# Patient Record
Sex: Male | Born: 1945 | Race: White | Hispanic: No | Marital: Married | Smoking: Former smoker
Health system: Southern US, Community
[De-identification: ages and names within clinical notes are randomized; demographics above are authoritative.]

## PROBLEM LIST (undated history)

## (undated) ENCOUNTER — Ambulatory Visit (HOSPITAL_COMMUNITY): Admission: EM | Source: Home / Self Care

## (undated) DIAGNOSIS — E7800 Pure hypercholesterolemia, unspecified: Secondary | ICD-10-CM

## (undated) DIAGNOSIS — H3530 Unspecified macular degeneration: Secondary | ICD-10-CM

## (undated) DIAGNOSIS — R202 Paresthesia of skin: Secondary | ICD-10-CM

## (undated) DIAGNOSIS — E119 Type 2 diabetes mellitus without complications: Secondary | ICD-10-CM

## (undated) DIAGNOSIS — Z860100 Personal history of colon polyps, unspecified: Secondary | ICD-10-CM

## (undated) DIAGNOSIS — G458 Other transient cerebral ischemic attacks and related syndromes: Secondary | ICD-10-CM

## (undated) DIAGNOSIS — L309 Dermatitis, unspecified: Secondary | ICD-10-CM

## (undated) DIAGNOSIS — M1990 Unspecified osteoarthritis, unspecified site: Secondary | ICD-10-CM

## (undated) DIAGNOSIS — M1120 Other chondrocalcinosis, unspecified site: Secondary | ICD-10-CM

## (undated) DIAGNOSIS — E785 Hyperlipidemia, unspecified: Secondary | ICD-10-CM

## (undated) DIAGNOSIS — R0989 Other specified symptoms and signs involving the circulatory and respiratory systems: Secondary | ICD-10-CM

## (undated) DIAGNOSIS — I509 Heart failure, unspecified: Secondary | ICD-10-CM

## (undated) HISTORY — DX: Paresthesia of skin: R20.2

## (undated) HISTORY — DX: Unspecified macular degeneration: H35.30

## (undated) HISTORY — PX: KNEE SURGERY: SHX244

## (undated) HISTORY — PX: REFRACTIVE SURGERY: SHX103

## (undated) HISTORY — PX: TONSILLECTOMY: SUR1361

## (undated) HISTORY — DX: Heart failure, unspecified: I50.9

## (undated) HISTORY — DX: Dermatitis, unspecified: L30.9

## (undated) HISTORY — PX: CATARACT EXTRACTION: SUR2

## (undated) HISTORY — DX: Other specified symptoms and signs involving the circulatory and respiratory systems: R09.89

## (undated) HISTORY — DX: Other transient cerebral ischemic attacks and related syndromes: G45.8

## (undated) HISTORY — DX: Pure hypercholesterolemia, unspecified: E78.00

## (undated) HISTORY — DX: Hyperlipidemia, unspecified: E78.5

## (undated) HISTORY — DX: Unspecified osteoarthritis, unspecified site: M19.90

## (undated) HISTORY — DX: Personal history of colon polyps, unspecified: Z86.0100

## (undated) HISTORY — DX: Other chondrocalcinosis, unspecified site: M11.20

## (undated) HISTORY — DX: Personal history of colonic polyps: Z86.010

## (undated) HISTORY — PX: HYDROCELE EXCISION / REPAIR: SUR1145

## (undated) HISTORY — PX: CARDIAC CATHETERIZATION: SHX172

---

## 2003-11-15 ENCOUNTER — Emergency Department (HOSPITAL_COMMUNITY): Admission: EM | Admit: 2003-11-15 | Discharge: 2003-11-16 | Payer: Self-pay | Admitting: Emergency Medicine

## 2003-12-03 ENCOUNTER — Encounter: Admission: RE | Admit: 2003-12-03 | Discharge: 2003-12-03 | Payer: Self-pay | Admitting: Internal Medicine

## 2005-10-11 ENCOUNTER — Encounter: Admission: RE | Admit: 2005-10-11 | Discharge: 2005-10-11 | Payer: Self-pay | Admitting: Internal Medicine

## 2005-11-18 ENCOUNTER — Ambulatory Visit (HOSPITAL_COMMUNITY): Admission: RE | Admit: 2005-11-18 | Discharge: 2005-11-18 | Payer: Self-pay | Admitting: Orthopedic Surgery

## 2012-06-28 DIAGNOSIS — M79609 Pain in unspecified limb: Secondary | ICD-10-CM | POA: Diagnosis not present

## 2012-07-12 DIAGNOSIS — B079 Viral wart, unspecified: Secondary | ICD-10-CM | POA: Diagnosis not present

## 2012-09-14 DIAGNOSIS — Z79899 Other long term (current) drug therapy: Secondary | ICD-10-CM | POA: Diagnosis not present

## 2012-09-14 DIAGNOSIS — F17200 Nicotine dependence, unspecified, uncomplicated: Secondary | ICD-10-CM | POA: Diagnosis not present

## 2012-09-14 DIAGNOSIS — E782 Mixed hyperlipidemia: Secondary | ICD-10-CM | POA: Diagnosis not present

## 2012-09-14 DIAGNOSIS — Z125 Encounter for screening for malignant neoplasm of prostate: Secondary | ICD-10-CM | POA: Diagnosis not present

## 2012-09-14 DIAGNOSIS — E1149 Type 2 diabetes mellitus with other diabetic neurological complication: Secondary | ICD-10-CM | POA: Diagnosis not present

## 2012-09-14 DIAGNOSIS — M1710 Unilateral primary osteoarthritis, unspecified knee: Secondary | ICD-10-CM | POA: Diagnosis not present

## 2012-09-14 DIAGNOSIS — IMO0002 Reserved for concepts with insufficient information to code with codable children: Secondary | ICD-10-CM | POA: Diagnosis not present

## 2012-09-14 DIAGNOSIS — IMO0001 Reserved for inherently not codable concepts without codable children: Secondary | ICD-10-CM | POA: Diagnosis not present

## 2012-12-05 DIAGNOSIS — E1149 Type 2 diabetes mellitus with other diabetic neurological complication: Secondary | ICD-10-CM | POA: Diagnosis not present

## 2012-12-05 DIAGNOSIS — Z0000 Encounter for general adult medical examination without abnormal findings: Secondary | ICD-10-CM | POA: Diagnosis not present

## 2012-12-05 DIAGNOSIS — IMO0001 Reserved for inherently not codable concepts without codable children: Secondary | ICD-10-CM | POA: Diagnosis not present

## 2012-12-05 DIAGNOSIS — M1710 Unilateral primary osteoarthritis, unspecified knee: Secondary | ICD-10-CM | POA: Diagnosis not present

## 2012-12-05 DIAGNOSIS — IMO0002 Reserved for concepts with insufficient information to code with codable children: Secondary | ICD-10-CM | POA: Diagnosis not present

## 2012-12-05 DIAGNOSIS — E782 Mixed hyperlipidemia: Secondary | ICD-10-CM | POA: Diagnosis not present

## 2012-12-05 DIAGNOSIS — F17200 Nicotine dependence, unspecified, uncomplicated: Secondary | ICD-10-CM | POA: Diagnosis not present

## 2012-12-05 DIAGNOSIS — N529 Male erectile dysfunction, unspecified: Secondary | ICD-10-CM | POA: Diagnosis not present

## 2013-01-11 DIAGNOSIS — Z79899 Other long term (current) drug therapy: Secondary | ICD-10-CM | POA: Diagnosis not present

## 2013-01-11 DIAGNOSIS — M1990 Unspecified osteoarthritis, unspecified site: Secondary | ICD-10-CM | POA: Diagnosis not present

## 2013-01-11 DIAGNOSIS — M2550 Pain in unspecified joint: Secondary | ICD-10-CM | POA: Diagnosis not present

## 2013-01-15 DIAGNOSIS — L821 Other seborrheic keratosis: Secondary | ICD-10-CM | POA: Diagnosis not present

## 2013-01-15 DIAGNOSIS — D239 Other benign neoplasm of skin, unspecified: Secondary | ICD-10-CM | POA: Diagnosis not present

## 2013-01-15 DIAGNOSIS — L819 Disorder of pigmentation, unspecified: Secondary | ICD-10-CM | POA: Diagnosis not present

## 2013-01-15 DIAGNOSIS — L570 Actinic keratosis: Secondary | ICD-10-CM | POA: Diagnosis not present

## 2013-03-25 DIAGNOSIS — E119 Type 2 diabetes mellitus without complications: Secondary | ICD-10-CM | POA: Diagnosis not present

## 2013-03-25 DIAGNOSIS — H2510 Age-related nuclear cataract, unspecified eye: Secondary | ICD-10-CM | POA: Diagnosis not present

## 2013-07-02 DIAGNOSIS — E782 Mixed hyperlipidemia: Secondary | ICD-10-CM | POA: Diagnosis not present

## 2013-07-02 DIAGNOSIS — F17200 Nicotine dependence, unspecified, uncomplicated: Secondary | ICD-10-CM | POA: Diagnosis not present

## 2013-07-02 DIAGNOSIS — M1990 Unspecified osteoarthritis, unspecified site: Secondary | ICD-10-CM | POA: Diagnosis not present

## 2013-07-02 DIAGNOSIS — N529 Male erectile dysfunction, unspecified: Secondary | ICD-10-CM | POA: Diagnosis not present

## 2013-07-02 DIAGNOSIS — Z23 Encounter for immunization: Secondary | ICD-10-CM | POA: Diagnosis not present

## 2013-07-02 DIAGNOSIS — IMO0001 Reserved for inherently not codable concepts without codable children: Secondary | ICD-10-CM | POA: Diagnosis not present

## 2013-07-02 DIAGNOSIS — L2089 Other atopic dermatitis: Secondary | ICD-10-CM | POA: Diagnosis not present

## 2013-07-08 DIAGNOSIS — M1990 Unspecified osteoarthritis, unspecified site: Secondary | ICD-10-CM | POA: Diagnosis not present

## 2013-07-08 DIAGNOSIS — M25519 Pain in unspecified shoulder: Secondary | ICD-10-CM | POA: Diagnosis not present

## 2013-07-08 DIAGNOSIS — M1120 Other chondrocalcinosis, unspecified site: Secondary | ICD-10-CM | POA: Diagnosis not present

## 2013-07-08 DIAGNOSIS — Z79899 Other long term (current) drug therapy: Secondary | ICD-10-CM | POA: Diagnosis not present

## 2013-07-19 DIAGNOSIS — IMO0001 Reserved for inherently not codable concepts without codable children: Secondary | ICD-10-CM | POA: Diagnosis not present

## 2013-07-19 DIAGNOSIS — N529 Male erectile dysfunction, unspecified: Secondary | ICD-10-CM | POA: Diagnosis not present

## 2013-09-30 DIAGNOSIS — M1120 Other chondrocalcinosis, unspecified site: Secondary | ICD-10-CM | POA: Diagnosis not present

## 2013-09-30 DIAGNOSIS — M25519 Pain in unspecified shoulder: Secondary | ICD-10-CM | POA: Diagnosis not present

## 2013-09-30 DIAGNOSIS — M1990 Unspecified osteoarthritis, unspecified site: Secondary | ICD-10-CM | POA: Diagnosis not present

## 2013-09-30 DIAGNOSIS — Z79899 Other long term (current) drug therapy: Secondary | ICD-10-CM | POA: Diagnosis not present

## 2013-10-07 DIAGNOSIS — R7989 Other specified abnormal findings of blood chemistry: Secondary | ICD-10-CM | POA: Diagnosis not present

## 2013-10-07 DIAGNOSIS — E119 Type 2 diabetes mellitus without complications: Secondary | ICD-10-CM | POA: Diagnosis not present

## 2013-10-22 DIAGNOSIS — E1149 Type 2 diabetes mellitus with other diabetic neurological complication: Secondary | ICD-10-CM | POA: Diagnosis not present

## 2013-10-22 DIAGNOSIS — F17200 Nicotine dependence, unspecified, uncomplicated: Secondary | ICD-10-CM | POA: Diagnosis not present

## 2013-10-22 DIAGNOSIS — L2089 Other atopic dermatitis: Secondary | ICD-10-CM | POA: Diagnosis not present

## 2013-10-22 DIAGNOSIS — IMO0001 Reserved for inherently not codable concepts without codable children: Secondary | ICD-10-CM | POA: Diagnosis not present

## 2013-11-22 DIAGNOSIS — IMO0001 Reserved for inherently not codable concepts without codable children: Secondary | ICD-10-CM | POA: Diagnosis not present

## 2013-11-26 DIAGNOSIS — IMO0001 Reserved for inherently not codable concepts without codable children: Secondary | ICD-10-CM | POA: Diagnosis not present

## 2013-11-26 DIAGNOSIS — L2089 Other atopic dermatitis: Secondary | ICD-10-CM | POA: Diagnosis not present

## 2013-11-26 DIAGNOSIS — F17200 Nicotine dependence, unspecified, uncomplicated: Secondary | ICD-10-CM | POA: Diagnosis not present

## 2013-12-04 DIAGNOSIS — J069 Acute upper respiratory infection, unspecified: Secondary | ICD-10-CM | POA: Diagnosis not present

## 2013-12-04 DIAGNOSIS — R5381 Other malaise: Secondary | ICD-10-CM | POA: Diagnosis not present

## 2013-12-04 DIAGNOSIS — H1030 Unspecified acute conjunctivitis, unspecified eye: Secondary | ICD-10-CM | POA: Diagnosis not present

## 2013-12-04 DIAGNOSIS — R5383 Other fatigue: Secondary | ICD-10-CM | POA: Diagnosis not present

## 2013-12-16 DIAGNOSIS — L259 Unspecified contact dermatitis, unspecified cause: Secondary | ICD-10-CM | POA: Diagnosis not present

## 2013-12-16 DIAGNOSIS — L570 Actinic keratosis: Secondary | ICD-10-CM | POA: Diagnosis not present

## 2013-12-16 DIAGNOSIS — L819 Disorder of pigmentation, unspecified: Secondary | ICD-10-CM | POA: Diagnosis not present

## 2013-12-16 DIAGNOSIS — L821 Other seborrheic keratosis: Secondary | ICD-10-CM | POA: Diagnosis not present

## 2013-12-23 DIAGNOSIS — L2089 Other atopic dermatitis: Secondary | ICD-10-CM | POA: Diagnosis not present

## 2013-12-23 DIAGNOSIS — E782 Mixed hyperlipidemia: Secondary | ICD-10-CM | POA: Diagnosis not present

## 2013-12-23 DIAGNOSIS — Z1331 Encounter for screening for depression: Secondary | ICD-10-CM | POA: Diagnosis not present

## 2013-12-23 DIAGNOSIS — H1030 Unspecified acute conjunctivitis, unspecified eye: Secondary | ICD-10-CM | POA: Diagnosis not present

## 2013-12-23 DIAGNOSIS — Z125 Encounter for screening for malignant neoplasm of prostate: Secondary | ICD-10-CM | POA: Diagnosis not present

## 2013-12-23 DIAGNOSIS — IMO0001 Reserved for inherently not codable concepts without codable children: Secondary | ICD-10-CM | POA: Diagnosis not present

## 2013-12-23 DIAGNOSIS — N529 Male erectile dysfunction, unspecified: Secondary | ICD-10-CM | POA: Diagnosis not present

## 2013-12-23 DIAGNOSIS — Z0000 Encounter for general adult medical examination without abnormal findings: Secondary | ICD-10-CM | POA: Diagnosis not present

## 2013-12-23 DIAGNOSIS — E1149 Type 2 diabetes mellitus with other diabetic neurological complication: Secondary | ICD-10-CM | POA: Diagnosis not present

## 2014-07-03 DIAGNOSIS — E1342 Other specified diabetes mellitus with diabetic polyneuropathy: Secondary | ICD-10-CM | POA: Diagnosis not present

## 2014-07-03 DIAGNOSIS — E782 Mixed hyperlipidemia: Secondary | ICD-10-CM | POA: Diagnosis not present

## 2014-07-03 DIAGNOSIS — E1165 Type 2 diabetes mellitus with hyperglycemia: Secondary | ICD-10-CM | POA: Diagnosis not present

## 2014-07-03 DIAGNOSIS — R209 Unspecified disturbances of skin sensation: Secondary | ICD-10-CM | POA: Diagnosis not present

## 2014-07-03 DIAGNOSIS — Z23 Encounter for immunization: Secondary | ICD-10-CM | POA: Diagnosis not present

## 2014-07-03 DIAGNOSIS — N5201 Erectile dysfunction due to arterial insufficiency: Secondary | ICD-10-CM | POA: Diagnosis not present

## 2014-07-03 DIAGNOSIS — Z79899 Other long term (current) drug therapy: Secondary | ICD-10-CM | POA: Diagnosis not present

## 2014-07-03 DIAGNOSIS — F17210 Nicotine dependence, cigarettes, uncomplicated: Secondary | ICD-10-CM | POA: Diagnosis not present

## 2014-07-03 DIAGNOSIS — R252 Cramp and spasm: Secondary | ICD-10-CM | POA: Diagnosis not present

## 2014-07-16 DIAGNOSIS — M1180 Other specified crystal arthropathies, unspecified site: Secondary | ICD-10-CM | POA: Diagnosis not present

## 2014-07-16 DIAGNOSIS — M150 Primary generalized (osteo)arthritis: Secondary | ICD-10-CM | POA: Diagnosis not present

## 2014-08-26 DIAGNOSIS — H2513 Age-related nuclear cataract, bilateral: Secondary | ICD-10-CM | POA: Diagnosis not present

## 2014-08-26 DIAGNOSIS — H3531 Nonexudative age-related macular degeneration: Secondary | ICD-10-CM | POA: Diagnosis not present

## 2014-08-26 DIAGNOSIS — H25013 Cortical age-related cataract, bilateral: Secondary | ICD-10-CM | POA: Diagnosis not present

## 2014-08-26 DIAGNOSIS — E119 Type 2 diabetes mellitus without complications: Secondary | ICD-10-CM | POA: Diagnosis not present

## 2014-12-03 DIAGNOSIS — H40013 Open angle with borderline findings, low risk, bilateral: Secondary | ICD-10-CM | POA: Diagnosis not present

## 2014-12-18 DIAGNOSIS — L812 Freckles: Secondary | ICD-10-CM | POA: Diagnosis not present

## 2014-12-18 DIAGNOSIS — L570 Actinic keratosis: Secondary | ICD-10-CM | POA: Diagnosis not present

## 2014-12-18 DIAGNOSIS — D225 Melanocytic nevi of trunk: Secondary | ICD-10-CM | POA: Diagnosis not present

## 2014-12-18 DIAGNOSIS — L853 Xerosis cutis: Secondary | ICD-10-CM | POA: Diagnosis not present

## 2014-12-18 DIAGNOSIS — L821 Other seborrheic keratosis: Secondary | ICD-10-CM | POA: Diagnosis not present

## 2014-12-18 DIAGNOSIS — D1801 Hemangioma of skin and subcutaneous tissue: Secondary | ICD-10-CM | POA: Diagnosis not present

## 2014-12-26 DIAGNOSIS — F17210 Nicotine dependence, cigarettes, uncomplicated: Secondary | ICD-10-CM | POA: Diagnosis not present

## 2014-12-26 DIAGNOSIS — Z1389 Encounter for screening for other disorder: Secondary | ICD-10-CM | POA: Diagnosis not present

## 2014-12-26 DIAGNOSIS — E1140 Type 2 diabetes mellitus with diabetic neuropathy, unspecified: Secondary | ICD-10-CM | POA: Diagnosis not present

## 2014-12-26 DIAGNOSIS — E1165 Type 2 diabetes mellitus with hyperglycemia: Secondary | ICD-10-CM | POA: Diagnosis not present

## 2014-12-26 DIAGNOSIS — Z79899 Other long term (current) drug therapy: Secondary | ICD-10-CM | POA: Diagnosis not present

## 2014-12-26 DIAGNOSIS — R252 Cramp and spasm: Secondary | ICD-10-CM | POA: Diagnosis not present

## 2014-12-26 DIAGNOSIS — R209 Unspecified disturbances of skin sensation: Secondary | ICD-10-CM | POA: Diagnosis not present

## 2014-12-26 DIAGNOSIS — Z125 Encounter for screening for malignant neoplasm of prostate: Secondary | ICD-10-CM | POA: Diagnosis not present

## 2014-12-26 DIAGNOSIS — Z0001 Encounter for general adult medical examination with abnormal findings: Secondary | ICD-10-CM | POA: Diagnosis not present

## 2015-01-13 ENCOUNTER — Other Ambulatory Visit: Payer: Self-pay | Admitting: Acute Care

## 2015-01-13 DIAGNOSIS — Z87891 Personal history of nicotine dependence: Secondary | ICD-10-CM

## 2015-01-14 ENCOUNTER — Ambulatory Visit (INDEPENDENT_AMBULATORY_CARE_PROVIDER_SITE_OTHER): Payer: Medicare Other | Admitting: Acute Care

## 2015-01-14 ENCOUNTER — Encounter (INDEPENDENT_AMBULATORY_CARE_PROVIDER_SITE_OTHER): Payer: Self-pay

## 2015-01-14 ENCOUNTER — Encounter: Payer: Self-pay | Admitting: Acute Care

## 2015-01-14 DIAGNOSIS — Z87891 Personal history of nicotine dependence: Secondary | ICD-10-CM

## 2015-01-14 NOTE — Progress Notes (Signed)
Shared Decision Making Visit Lung Cancer Screening Program [phone])   Eligibility:  Age : 70 years old  Pack Years Smoking History Calculation 57 pack years (# packs/per year x # years smoked)  Recent History of coughing up blood  no  Unexplained weight loss? no ( >Than 15 pounds within the last 6 months )  Prior History Lung / other cancer no (Diagnosis within the last 5 years already requiring surveillance chest CT Scans).  Smoking Status: Current Smoker  Former Smokers: Years since quit: Current smoker  Quit Date: N/A  Visit Components:  Discussion included one or more decision making aids. yes  Discussion included risk/benefits of screening. yes  Discussion included potential follow up diagnostic testing for abnormal scans. yes  Discussion included meaning and risk of over diagnosis. yes  Discussion included meaning and risk of False Positives. yes  Discussion included meaning of total radiation exposure. yes  Counseling Included:  Importance of adherence to annual lung cancer LDCT screening. yes  Impact of comorbidities on ability to participate in the program. yes  Ability and willingness to under diagnostic treatment. yes  Smoking Cessation Counseling:  Current Smokers:   Discussed importance of smoking cessation. yes  Information about tobacco cessation classes and interventions provided to patient. yes  Patient provided with "ticket" for LDCT Scan. yes  Symptomatic Patient. no  Counseling :Not symptomatic  Diagnosis Code: Tobacco Use Z72.0  Asymptomatic Patient yes  Counseling: Yes, >3 minutes  Former Smokers:   Discussed the importance of maintaining cigarette abstinence. yes  Diagnosis Code: Personal History of Nicotine Dependence. S34.196  Information about tobacco cessation classes and interventions provided to patient. Yes  Patient provided with "ticket" for LDCT Scan. yes  Written Order for Lung Cancer Screening with LDCT placed  in Epic. Yes (CT Chest Lung Cancer Screening Low Dose W/O CM) [id number] Z12.2-Screening of respiratory organs Z87.891-Personal history of nicotine dependence  I spent 15 minutes of face to face time meeting with Mr. Lapoint and his wife discussing the risks and benefits of the lung cancer screening program to him as an individual.He verbalized understanding of the program, and understood the risks and benefits, and how to determine if this program is a good fit for him.We discussed that the single most powerful thing he can do to decrease his risk of lung cancer is to quit smoking. He stated that he has diabetes and the weight gain is what causes him the most concern when considering quitting smoking. I gave him a " Be stronger than your excuses" card, with information within the community to assist him in his quit journey. I also told him that we are here to help in any way when he decides he is ready to quit with non-nicotine medications, nicotine replacement therapy, and support groups and resources.He verbalized understanding.We viewed the power point. He and his wife both asked very appropriate questions.He is scheduled for his CT scan 01/15/15 at 0815. He knows where to go for the scan. I have told him I will call him with the scan results.He has my contact information should he need to get in touch with me.He is a very nice gentleman who verbalized understanding of the risks and benefits of the screening program.   Magdalen Spatz, AGACNP-BC

## 2015-01-15 ENCOUNTER — Telehealth: Payer: Self-pay | Admitting: Acute Care

## 2015-01-15 ENCOUNTER — Ambulatory Visit (INDEPENDENT_AMBULATORY_CARE_PROVIDER_SITE_OTHER)
Admission: RE | Admit: 2015-01-15 | Discharge: 2015-01-15 | Disposition: A | Discharge status: Home / Self Care | Payer: Medicare Other | Source: Ambulatory Visit | Attending: Acute Care | Admitting: Acute Care

## 2015-01-15 DIAGNOSIS — Z87891 Personal history of nicotine dependence: Secondary | ICD-10-CM

## 2015-01-15 NOTE — Telephone Encounter (Signed)
Casey Lynch called for the results of his low dose CT scan.I told him he has a Lung Rads 2 scan, which is a nodule that is benign( not cancer ) and that it is not suspicious or concerning. He will not need another scan for 12 months. He verbalized understanding and has my contact information if he has any further questions.

## 2015-01-21 ENCOUNTER — Encounter: Payer: Self-pay | Admitting: Acute Care

## 2015-03-02 ENCOUNTER — Other Ambulatory Visit: Payer: Self-pay

## 2015-03-20 ENCOUNTER — Telehealth: Payer: Self-pay | Admitting: *Deleted

## 2015-03-20 NOTE — Telephone Encounter (Signed)
Oncology Nurse Navigator Documentation  Oncology Nurse Navigator Flowsheets 03/20/2015  Navigator Encounter Type Other/Telephone call.  I called to follow up with patient regarding his smoking cessation.   I left a vm message with my name and phone number to call if needed.   Barriers/Navigation Needs Education

## 2015-07-03 DIAGNOSIS — E1140 Type 2 diabetes mellitus with diabetic neuropathy, unspecified: Secondary | ICD-10-CM | POA: Diagnosis not present

## 2015-07-03 DIAGNOSIS — F17210 Nicotine dependence, cigarettes, uncomplicated: Secondary | ICD-10-CM | POA: Diagnosis not present

## 2015-07-03 DIAGNOSIS — R209 Unspecified disturbances of skin sensation: Secondary | ICD-10-CM | POA: Diagnosis not present

## 2015-07-03 DIAGNOSIS — R252 Cramp and spasm: Secondary | ICD-10-CM | POA: Diagnosis not present

## 2015-07-03 DIAGNOSIS — Z79899 Other long term (current) drug therapy: Secondary | ICD-10-CM | POA: Diagnosis not present

## 2015-07-03 DIAGNOSIS — E1165 Type 2 diabetes mellitus with hyperglycemia: Secondary | ICD-10-CM | POA: Diagnosis not present

## 2015-07-03 DIAGNOSIS — Z23 Encounter for immunization: Secondary | ICD-10-CM | POA: Diagnosis not present

## 2015-07-17 DIAGNOSIS — M25519 Pain in unspecified shoulder: Secondary | ICD-10-CM | POA: Diagnosis not present

## 2015-07-17 DIAGNOSIS — M1180 Other specified crystal arthropathies, unspecified site: Secondary | ICD-10-CM | POA: Diagnosis not present

## 2015-07-17 DIAGNOSIS — M150 Primary generalized (osteo)arthritis: Secondary | ICD-10-CM | POA: Diagnosis not present

## 2015-08-18 DIAGNOSIS — H25013 Cortical age-related cataract, bilateral: Secondary | ICD-10-CM | POA: Diagnosis not present

## 2015-08-18 DIAGNOSIS — H353122 Nonexudative age-related macular degeneration, left eye, intermediate dry stage: Secondary | ICD-10-CM | POA: Diagnosis not present

## 2015-08-18 DIAGNOSIS — H11153 Pinguecula, bilateral: Secondary | ICD-10-CM | POA: Diagnosis not present

## 2015-08-18 DIAGNOSIS — H353111 Nonexudative age-related macular degeneration, right eye, early dry stage: Secondary | ICD-10-CM | POA: Diagnosis not present

## 2015-08-18 DIAGNOSIS — H2513 Age-related nuclear cataract, bilateral: Secondary | ICD-10-CM | POA: Diagnosis not present

## 2015-08-18 DIAGNOSIS — E119 Type 2 diabetes mellitus without complications: Secondary | ICD-10-CM | POA: Diagnosis not present

## 2015-08-18 DIAGNOSIS — H40013 Open angle with borderline findings, low risk, bilateral: Secondary | ICD-10-CM | POA: Diagnosis not present

## 2015-11-24 ENCOUNTER — Other Ambulatory Visit: Payer: Self-pay | Admitting: Acute Care

## 2015-11-24 DIAGNOSIS — F17210 Nicotine dependence, cigarettes, uncomplicated: Secondary | ICD-10-CM

## 2015-12-07 DIAGNOSIS — H40013 Open angle with borderline findings, low risk, bilateral: Secondary | ICD-10-CM | POA: Diagnosis not present

## 2015-12-25 DIAGNOSIS — L281 Prurigo nodularis: Secondary | ICD-10-CM | POA: Diagnosis not present

## 2015-12-25 DIAGNOSIS — L853 Xerosis cutis: Secondary | ICD-10-CM | POA: Diagnosis not present

## 2015-12-25 DIAGNOSIS — L570 Actinic keratosis: Secondary | ICD-10-CM | POA: Diagnosis not present

## 2015-12-25 DIAGNOSIS — L821 Other seborrheic keratosis: Secondary | ICD-10-CM | POA: Diagnosis not present

## 2016-01-04 DIAGNOSIS — M25511 Pain in right shoulder: Secondary | ICD-10-CM | POA: Diagnosis not present

## 2016-01-04 DIAGNOSIS — M2550 Pain in unspecified joint: Secondary | ICD-10-CM | POA: Diagnosis not present

## 2016-01-04 DIAGNOSIS — M7062 Trochanteric bursitis, left hip: Secondary | ICD-10-CM | POA: Diagnosis not present

## 2016-01-04 DIAGNOSIS — M1120 Other chondrocalcinosis, unspecified site: Secondary | ICD-10-CM | POA: Diagnosis not present

## 2016-01-04 DIAGNOSIS — M7061 Trochanteric bursitis, right hip: Secondary | ICD-10-CM | POA: Diagnosis not present

## 2016-01-04 DIAGNOSIS — M25512 Pain in left shoulder: Secondary | ICD-10-CM | POA: Diagnosis not present

## 2016-01-04 DIAGNOSIS — M150 Primary generalized (osteo)arthritis: Secondary | ICD-10-CM | POA: Diagnosis not present

## 2016-06-22 ENCOUNTER — Ambulatory Visit (INDEPENDENT_AMBULATORY_CARE_PROVIDER_SITE_OTHER)
Admission: RE | Admit: 2016-06-22 | Discharge: 2016-06-22 | Disposition: A | Discharge status: Home / Self Care | Payer: Medicare Other | Source: Ambulatory Visit | Attending: Acute Care | Admitting: Acute Care

## 2016-06-22 DIAGNOSIS — Z87891 Personal history of nicotine dependence: Secondary | ICD-10-CM

## 2016-06-22 DIAGNOSIS — F17210 Nicotine dependence, cigarettes, uncomplicated: Secondary | ICD-10-CM | POA: Diagnosis not present

## 2016-06-23 DIAGNOSIS — L308 Other specified dermatitis: Secondary | ICD-10-CM | POA: Diagnosis not present

## 2016-06-24 ENCOUNTER — Telehealth: Payer: Self-pay | Admitting: Acute Care

## 2016-06-24 DIAGNOSIS — F17210 Nicotine dependence, cigarettes, uncomplicated: Secondary | ICD-10-CM

## 2016-06-24 NOTE — Telephone Encounter (Signed)
I have called [name] with the results of his low-dose screening CT. I explained that his scan was read as a Lung RADS 2: nodules that are benign in appearance and behavior with a very low likelihood of becoming a clinically active cancer due to size or lack of growth. Recommendation per radiology is for a repeat LDCT in 12 months. I told him we will order and schedule his annual scan for October 2018. Additionally we discussed the fact that the scan, like last year scan, indicated aortic atherosclerosis in addition to two-vessel coronary artery disease. I spoke with the patient about this who states that he cannot tolerate statin medications. He does have his cholesterol and triglycerides checked annually by his primary care provider. I told him I would send a copy of the scan to Dr. Inda Lynch his primary care physician. We also discussed the fact that the scan indicated mild emphysema and imaging suggesting underlying COPD. He verbalized understanding of the above and had no further questions at completion of the call.

## 2016-06-29 DIAGNOSIS — H40013 Open angle with borderline findings, low risk, bilateral: Secondary | ICD-10-CM | POA: Diagnosis not present

## 2016-07-01 DIAGNOSIS — R209 Unspecified disturbances of skin sensation: Secondary | ICD-10-CM | POA: Diagnosis not present

## 2016-07-01 DIAGNOSIS — Z86010 Personal history of colonic polyps: Secondary | ICD-10-CM | POA: Diagnosis not present

## 2016-07-01 DIAGNOSIS — M1120 Other chondrocalcinosis, unspecified site: Secondary | ICD-10-CM | POA: Diagnosis not present

## 2016-07-01 DIAGNOSIS — E1165 Type 2 diabetes mellitus with hyperglycemia: Secondary | ICD-10-CM | POA: Diagnosis not present

## 2016-07-01 DIAGNOSIS — F17210 Nicotine dependence, cigarettes, uncomplicated: Secondary | ICD-10-CM | POA: Diagnosis not present

## 2016-07-01 DIAGNOSIS — Z0000 Encounter for general adult medical examination without abnormal findings: Secondary | ICD-10-CM | POA: Diagnosis not present

## 2016-07-01 DIAGNOSIS — E782 Mixed hyperlipidemia: Secondary | ICD-10-CM | POA: Diagnosis not present

## 2016-07-01 DIAGNOSIS — Z1211 Encounter for screening for malignant neoplasm of colon: Secondary | ICD-10-CM | POA: Diagnosis not present

## 2016-07-01 DIAGNOSIS — E1140 Type 2 diabetes mellitus with diabetic neuropathy, unspecified: Secondary | ICD-10-CM | POA: Diagnosis not present

## 2016-07-01 DIAGNOSIS — Z7984 Long term (current) use of oral hypoglycemic drugs: Secondary | ICD-10-CM | POA: Diagnosis not present

## 2016-07-01 DIAGNOSIS — Z136 Encounter for screening for cardiovascular disorders: Secondary | ICD-10-CM | POA: Diagnosis not present

## 2016-07-01 DIAGNOSIS — Z79899 Other long term (current) drug therapy: Secondary | ICD-10-CM | POA: Diagnosis not present

## 2016-07-01 DIAGNOSIS — R252 Cramp and spasm: Secondary | ICD-10-CM | POA: Diagnosis not present

## 2016-07-01 DIAGNOSIS — Z1389 Encounter for screening for other disorder: Secondary | ICD-10-CM | POA: Diagnosis not present

## 2016-07-04 DIAGNOSIS — M150 Primary generalized (osteo)arthritis: Secondary | ICD-10-CM | POA: Diagnosis not present

## 2016-07-04 DIAGNOSIS — M1120 Other chondrocalcinosis, unspecified site: Secondary | ICD-10-CM | POA: Diagnosis not present

## 2016-07-04 DIAGNOSIS — M2550 Pain in unspecified joint: Secondary | ICD-10-CM | POA: Diagnosis not present

## 2016-07-04 DIAGNOSIS — Z79899 Other long term (current) drug therapy: Secondary | ICD-10-CM | POA: Diagnosis not present

## 2016-07-05 ENCOUNTER — Other Ambulatory Visit: Payer: Self-pay | Admitting: Internal Medicine

## 2016-07-05 DIAGNOSIS — Z136 Encounter for screening for cardiovascular disorders: Secondary | ICD-10-CM

## 2016-07-05 DIAGNOSIS — F17210 Nicotine dependence, cigarettes, uncomplicated: Secondary | ICD-10-CM

## 2016-07-07 ENCOUNTER — Ambulatory Visit
Admission: RE | Admit: 2016-07-07 | Discharge: 2016-07-07 | Disposition: A | Discharge status: Home / Self Care | Payer: Medicare Other | Source: Ambulatory Visit | Attending: Internal Medicine | Admitting: Internal Medicine

## 2016-07-07 DIAGNOSIS — Z87891 Personal history of nicotine dependence: Secondary | ICD-10-CM | POA: Diagnosis not present

## 2016-07-07 DIAGNOSIS — Z136 Encounter for screening for cardiovascular disorders: Secondary | ICD-10-CM | POA: Diagnosis not present

## 2016-07-07 DIAGNOSIS — F17210 Nicotine dependence, cigarettes, uncomplicated: Secondary | ICD-10-CM

## 2016-08-11 ENCOUNTER — Other Ambulatory Visit: Payer: Self-pay | Admitting: Gastroenterology

## 2016-10-24 ENCOUNTER — Ambulatory Visit (HOSPITAL_COMMUNITY): Payer: Medicare Other | Admitting: Anesthesiology

## 2016-10-24 ENCOUNTER — Encounter (HOSPITAL_COMMUNITY)
Admission: RE | Disposition: A | Discharge status: Home / Self Care | Payer: Self-pay | Source: Ambulatory Visit | Attending: Gastroenterology

## 2016-10-24 ENCOUNTER — Encounter (HOSPITAL_COMMUNITY): Payer: Self-pay

## 2016-10-24 ENCOUNTER — Ambulatory Visit (HOSPITAL_COMMUNITY)
Admission: RE | Admit: 2016-10-24 | Discharge: 2016-10-24 | Disposition: A | Discharge status: Home / Self Care | Payer: Medicare Other | Source: Ambulatory Visit | Attending: Gastroenterology | Admitting: Gastroenterology

## 2016-10-24 DIAGNOSIS — D123 Benign neoplasm of transverse colon: Secondary | ICD-10-CM | POA: Diagnosis not present

## 2016-10-24 DIAGNOSIS — M1990 Unspecified osteoarthritis, unspecified site: Secondary | ICD-10-CM | POA: Insufficient documentation

## 2016-10-24 DIAGNOSIS — K5790 Diverticulosis of intestine, part unspecified, without perforation or abscess without bleeding: Secondary | ICD-10-CM | POA: Diagnosis not present

## 2016-10-24 DIAGNOSIS — D122 Benign neoplasm of ascending colon: Secondary | ICD-10-CM | POA: Insufficient documentation

## 2016-10-24 DIAGNOSIS — D124 Benign neoplasm of descending colon: Secondary | ICD-10-CM | POA: Diagnosis not present

## 2016-10-24 DIAGNOSIS — Z87891 Personal history of nicotine dependence: Secondary | ICD-10-CM | POA: Diagnosis not present

## 2016-10-24 DIAGNOSIS — Z7984 Long term (current) use of oral hypoglycemic drugs: Secondary | ICD-10-CM | POA: Insufficient documentation

## 2016-10-24 DIAGNOSIS — Z1211 Encounter for screening for malignant neoplasm of colon: Secondary | ICD-10-CM | POA: Insufficient documentation

## 2016-10-24 DIAGNOSIS — Z86010 Personal history of colonic polyps: Secondary | ICD-10-CM | POA: Insufficient documentation

## 2016-10-24 DIAGNOSIS — K635 Polyp of colon: Secondary | ICD-10-CM | POA: Diagnosis not present

## 2016-10-24 DIAGNOSIS — E119 Type 2 diabetes mellitus without complications: Secondary | ICD-10-CM | POA: Diagnosis not present

## 2016-10-24 DIAGNOSIS — D120 Benign neoplasm of cecum: Secondary | ICD-10-CM | POA: Insufficient documentation

## 2016-10-24 HISTORY — DX: Type 2 diabetes mellitus without complications: E11.9

## 2016-10-24 HISTORY — PX: COLONOSCOPY WITH PROPOFOL: SHX5780

## 2016-10-24 LAB — GLUCOSE, CAPILLARY: Glucose-Capillary: 158 mg/dL — ABNORMAL HIGH (ref 65–99)

## 2016-10-24 SURGERY — COLONOSCOPY WITH PROPOFOL
Anesthesia: Monitor Anesthesia Care

## 2016-10-24 MED ORDER — PROPOFOL 500 MG/50ML IV EMUL
INTRAVENOUS | Status: DC | PRN
  Administered 2016-10-24: 125 ug/kg/min via INTRAVENOUS

## 2016-10-24 MED ORDER — SODIUM CHLORIDE 0.9 % IV SOLN
INTRAVENOUS | Status: DC
Start: 2016-10-24 — End: 2016-10-24

## 2016-10-24 MED ORDER — LIDOCAINE 2% (20 MG/ML) 5 ML SYRINGE
INTRAMUSCULAR | Status: AC
  Filled 2016-10-24: qty 5

## 2016-10-24 MED ORDER — PROPOFOL 10 MG/ML IV BOLUS
INTRAVENOUS | Status: AC
  Filled 2016-10-24: qty 60

## 2016-10-24 MED ORDER — PROPOFOL 10 MG/ML IV BOLUS
INTRAVENOUS | Status: DC | PRN
  Administered 2016-10-24: 30 mg via INTRAVENOUS
  Administered 2016-10-24 (×2): 20 mg via INTRAVENOUS

## 2016-10-24 MED ORDER — LIDOCAINE 2% (20 MG/ML) 5 ML SYRINGE
INTRAMUSCULAR | Status: DC | PRN
  Administered 2016-10-24: 60 mg via INTRAVENOUS

## 2016-10-24 MED ORDER — LACTATED RINGERS IV SOLN
INTRAVENOUS | Status: DC
  Administered 2016-10-24: 1000 mL via INTRAVENOUS

## 2016-10-24 NOTE — Op Note (Signed)
[facility] Patient Name: Casey Lynch Procedure Date: 10/24/2016 MRN: [id number] Attending MD: Garlan Fair , MD Date of Birth: 08/09/1946 CSN: [id number] Age: 71 Admit Type: Outpatient Procedure:                Colonoscopy Indications:              High risk colon cancer surveillance: Personal                            history of non-advanced adenoma Providers:                Garlan Fair, MD, Jobe Igo, RN, Cherylynn Ridges, Technician, Dione Booze, CRNA Referring MD:              Medicines:                Propofol per Anesthesia Complications:            No immediate complications. Estimated Blood Loss:     Estimated blood loss was minimal. Procedure:                Pre-Anesthesia Assessment:                           - Prior to the procedure, a History and Physical                            was performed, and patient medications and                            allergies were reviewed. The patient's tolerance of                            previous anesthesia was also reviewed. The risks                            and benefits of the procedure and the sedation                            options and risks were discussed with the patient.                            All questions were answered, and informed consent                            was obtained. Prior Anticoagulants: The patient has                            taken aspirin, last dose was day of procedure. ASA                            Grade Assessment: II - A patient with mild systemic  disease. After reviewing the risks and benefits,                            the patient was deemed in satisfactory condition to                            undergo the procedure.                           After obtaining informed consent, the colonoscope                            was passed under direct vision. Throughout the   procedure, the patient's blood pressure, pulse, and                            oxygen saturations were monitored continuously. The                            [id number]) scope was introduced through                            the anus and advanced to the the cecum, identified                            by appendiceal orifice and ileocecal valve. The                            colonoscopy was performed without difficulty. The                            patient tolerated the procedure well. The quality                            of the bowel preparation was adequate. The terminal                            ileum, the ileocecal valve, the appendiceal orifice                            and the rectum were photographed. Scope In: 10:30:46 AM Scope Out: 11:09:34 AM Scope Withdrawal Time: 0 hours 30 minutes 47 seconds  Total Procedure Duration: 0 hours 38 minutes 48 seconds  Findings:      The perianal and digital rectal examinations were normal.      A 3 mm polyp was found in the cecum. The polyp was sessile. The polyp       was removed with a cold biopsy forceps. Resection and retrieval were       complete.      Two sessile polyps were found in the ascending colon. The polyps were 5       to 7 mm in size. These polyps were removed with a cold snare. Resection       and retrieval were complete.      Two sessile polyps were found in the descending colon. The  polyps were 7       mm in size. These polyps were removed with a hot snare. Resection and       retrieval were complete.      Two sessile polyps were found in the descending colon. The polyps were 5       mm in size. These polyps were removed with a cold snare. Resection and       retrieval were complete.      The exam was otherwise without abnormality. Impression:               - One 3 mm polyp in the cecum, removed with a cold                            biopsy forceps. Resected and retrieved.                           - Two 5 to  7 mm polyps in the ascending colon,                            removed with a cold snare. Resected and retrieved.                           - Two 7 mm polyps in the descending colon, removed                            with a hot snare. Resected and retrieved.                           - Two 5 mm polyps in the descending colon, removed                            with a cold snare. Resected and retrieved.                           - The examination was otherwise normal. Moderate Sedation:      N/A- Per Anesthesia Care Recommendation:           - Patient has a contact number available for                            emergencies. The signs and symptoms of potential                            delayed complications were discussed with the                            patient. Return to normal activities tomorrow.                            Written discharge instructions were provided to the                            patient.                           -  Repeat colonoscopy date to be determined after                            pending pathology results are reviewed for                            surveillance.                           - Resume previous diet.                           - Continue present medications. Procedure Code(s):        --- Professional ---                           [phone], Colonoscopy, flexible; with removal of                            tumor(s), polyp(s), or other lesion(s) by snare                            technique                           [id number], 68, Colonoscopy, flexible; with biopsy,                            single or multiple Diagnosis Code(s):        --- Professional ---                           Z86.010, Personal history of colonic polyps                           D12.0, Benign neoplasm of cecum                           D12.2, Benign neoplasm of ascending colon                           D12.4, Benign neoplasm of descending colon CPT copyright 2016 American  Medical Association. All rights reserved. The codes documented in this report are preliminary and upon coder review may  be revised to meet current compliance requirements. Earle Gell, MD Garlan Fair, MD 10/24/2016 11:12:49 AM This report has been signed electronically. Number of Addenda: 0

## 2016-10-24 NOTE — Anesthesia Preprocedure Evaluation (Addendum)
Anesthesia Evaluation  Patient identified by MRN, date of birth, ID band Patient awake    Reviewed: Allergy & Precautions, NPO status , Patient's Chart, lab work & pertinent test results  Airway Mallampati: I  TM Distance: >3 FB Neck ROM: Full    Dental  (+) Teeth Intact, Dental Advisory Given   Pulmonary former smoker,    breath sounds clear to auscultation       Cardiovascular negative cardio ROS   Rhythm:Regular Rate:Normal     Neuro/Psych negative neurological ROS  negative psych ROS   GI/Hepatic negative GI ROS, Neg liver ROS,   Endo/Other  diabetes, Type 2, Oral Hypoglycemic Agents  Renal/GU negative Renal ROS  negative genitourinary   Musculoskeletal negative musculoskeletal ROS (+)   Abdominal   Peds negative pediatric ROS (+)  Hematology negative hematology ROS (+)   Anesthesia Other Findings   Reproductive/Obstetrics negative OB ROS                            Anesthesia Physical Anesthesia Plan  ASA: II  Anesthesia Plan: MAC   Post-op Pain Management:    Induction: Intravenous  Airway Management Planned: Natural Airway  Additional Equipment:   Intra-op Plan:   Post-operative Plan:   Informed Consent: I have reviewed the patients History and Physical, chart, labs and discussed the procedure including the risks, benefits and alternatives for the proposed anesthesia with the patient or authorized representative who has indicated his/her understanding and acceptance.     Plan Discussed with: CRNA  Anesthesia Plan Comments:         Anesthesia Quick Evaluation

## 2016-10-24 NOTE — Transfer of Care (Signed)
Immediate Anesthesia Transfer of Care Note  Patient: Casey Lynch  Procedure(s) Performed: Procedure(s): COLONOSCOPY WITH PROPOFOL (N/A)  Patient Location: PACU and Endoscopy Unit  Anesthesia Type:MAC  Level of Consciousness: sedated and patient cooperative  Airway & Oxygen Therapy: Patient Spontanous Breathing and Patient connected to face mask oxygen  Post-op Assessment: Report given to RN and Post -op Vital signs reviewed and stable  Post vital signs: Reviewed and stable  Last Vitals:  Vitals:   10/24/16 0841  BP: 137/85  Pulse: 85  Resp: 20  Temp: 36.7 C    Last Pain:  Vitals:   10/24/16 0841  TempSrc: Oral         Complications: No apparent anesthesia complications

## 2016-10-24 NOTE — Anesthesia Postprocedure Evaluation (Addendum)
Anesthesia Post Note  Patient: Casey Lynch  Procedure(s) Performed: Procedure(s) (LRB): COLONOSCOPY WITH PROPOFOL (N/A)  Patient location during evaluation: PACU Anesthesia Type: MAC Level of consciousness: awake and alert Pain management: pain level controlled Vital Signs Assessment: post-procedure vital signs reviewed and stable Respiratory status: spontaneous breathing, nonlabored ventilation, respiratory function stable and patient connected to nasal cannula oxygen Cardiovascular status: stable and blood pressure returned to baseline Anesthetic complications: no       Last Vitals:  Vitals:   10/24/16 0841 10/24/16 1108  BP: 137/85 102/79  Pulse: 85 88  Resp: 20 14  Temp: 36.7 C 37 C    Last Pain:  Vitals:   10/24/16 1108  TempSrc: Oral                 Effie Berkshire

## 2016-10-24 NOTE — Discharge Instructions (Signed)
Colonoscopy, Adult, Care After  This sheet gives you information about how to care for yourself after your procedure. Your health care provider may also give you more specific instructions. If you have problems or questions, contact your health care provider.  What can I expect after the procedure?  After the procedure, it is common to have:  · A small amount of blood in your stool for 24 hours after the procedure.  · Some gas.  · Mild abdominal cramping or bloating.    Follow these instructions at home:  General instructions     · For the first 24 hours after the procedure:  ? Do not drive or use machinery.  ? Do not sign important documents.  ? Do not drink alcohol.  ? Do your regular daily activities at a slower pace than normal.  ? Eat soft, easy-to-digest foods.  ? Rest often.  · Take over-the-counter or prescription medicines only as told by your health care provider.  · It is up to you to get the results of your procedure. Ask your health care provider, or the department performing the procedure, when your results will be ready.  Relieving cramping and bloating   · Try walking around when you have cramps or feel bloated.  · Apply heat to your abdomen as told by your health care provider. Use a heat source that your health care provider recommends, such as a moist heat pack or a heating pad.  ? Place a towel between your skin and the heat source.  ? Leave the heat on for 20-30 minutes.  ? Remove the heat if your skin turns bright red. This is especially important if you are unable to feel pain, heat, or cold. You may have a greater risk of getting burned.  Eating and drinking   · Drink enough fluid to keep your urine clear or pale yellow.  · Resume your normal diet as instructed by your health care provider. Avoid heavy or fried foods that are hard to digest.  · Avoid drinking alcohol for as long as instructed by your health care provider.  Contact a health care provider if:  · You have blood in your stool 2-3  days after the procedure.  Get help right away if:  · You have more than a small spotting of blood in your stool.  · You pass large blood clots in your stool.  · Your abdomen is swollen.  · You have nausea or vomiting.  · You have a fever.  · You have increasing abdominal pain that is not relieved with medicine.  This information is not intended to replace advice given to you by your health care provider. Make sure you discuss any questions you have with your health care provider.  Document Released: 04/05/2004 Document Revised: 05/16/2016 Document Reviewed: 11/03/2015  Elsevier Interactive Patient Education © 2017 [employer]

## 2016-10-24 NOTE — Anesthesia Procedure Notes (Signed)
Procedure Name: MAC Date/Time: 10/24/2016 10:24 AM Performed by: Dione Booze Pre-anesthesia Checklist: Patient identified, Patient being monitored, Suction available and Emergency Drugs available Patient Re-evaluated:Patient Re-evaluated prior to inductionOxygen Delivery Method: Simple face mask Placement Confirmation: positive ETCO2

## 2016-10-24 NOTE — H&P (Signed)
Procedure: Surveillance colonoscopy. 04/17/2012 colonoscopy was performed with removal of two small adenomatous descending colon polyps.  History: The patient is a 71 year old male born Dec 18, 1945. He is scheduled to undergo a surveillance colonoscopy today. He recently stopped smoking and reports normal bowel function. I will not screen for microscopic colitis.  Past medical history: Pseudogout. Osteoarthritis. Type 2 diabetes mellitus. Bilateral arthroscopic knee surgeries. Hydrocele repair. Tonsillectomy. Eyelid surgery.  Medication allergies: Tetanus toxoid vaccine.  Exam: The patient is alert and lying comfortably on the endoscopy stretcher. Abdomen is soft and nontender to palpation. Lungs are clear to auscultation. Cardiac exam reveals a regular rhythm.  Plan: Proceed with surveillance colonoscopy

## 2016-10-25 ENCOUNTER — Encounter (HOSPITAL_COMMUNITY): Payer: Self-pay | Admitting: Gastroenterology

## 2016-12-12 DIAGNOSIS — H40013 Open angle with borderline findings, low risk, bilateral: Secondary | ICD-10-CM | POA: Diagnosis not present

## 2016-12-12 DIAGNOSIS — L570 Actinic keratosis: Secondary | ICD-10-CM | POA: Diagnosis not present

## 2016-12-12 DIAGNOSIS — H2513 Age-related nuclear cataract, bilateral: Secondary | ICD-10-CM | POA: Diagnosis not present

## 2016-12-12 DIAGNOSIS — D1801 Hemangioma of skin and subcutaneous tissue: Secondary | ICD-10-CM | POA: Diagnosis not present

## 2016-12-12 DIAGNOSIS — H35033 Hypertensive retinopathy, bilateral: Secondary | ICD-10-CM | POA: Diagnosis not present

## 2016-12-12 DIAGNOSIS — L821 Other seborrheic keratosis: Secondary | ICD-10-CM | POA: Diagnosis not present

## 2016-12-12 DIAGNOSIS — E119 Type 2 diabetes mellitus without complications: Secondary | ICD-10-CM | POA: Diagnosis not present

## 2016-12-20 DIAGNOSIS — E559 Vitamin D deficiency, unspecified: Secondary | ICD-10-CM | POA: Diagnosis not present

## 2016-12-20 DIAGNOSIS — R208 Other disturbances of skin sensation: Secondary | ICD-10-CM | POA: Diagnosis not present

## 2016-12-20 DIAGNOSIS — N5201 Erectile dysfunction due to arterial insufficiency: Secondary | ICD-10-CM | POA: Diagnosis not present

## 2016-12-20 DIAGNOSIS — Z79899 Other long term (current) drug therapy: Secondary | ICD-10-CM | POA: Diagnosis not present

## 2016-12-20 DIAGNOSIS — Z7984 Long term (current) use of oral hypoglycemic drugs: Secondary | ICD-10-CM | POA: Diagnosis not present

## 2016-12-20 DIAGNOSIS — E1165 Type 2 diabetes mellitus with hyperglycemia: Secondary | ICD-10-CM | POA: Diagnosis not present

## 2016-12-20 DIAGNOSIS — E1140 Type 2 diabetes mellitus with diabetic neuropathy, unspecified: Secondary | ICD-10-CM | POA: Diagnosis not present

## 2016-12-20 DIAGNOSIS — E782 Mixed hyperlipidemia: Secondary | ICD-10-CM | POA: Diagnosis not present

## 2016-12-20 DIAGNOSIS — Z125 Encounter for screening for malignant neoplasm of prostate: Secondary | ICD-10-CM | POA: Diagnosis not present

## 2016-12-22 DIAGNOSIS — E782 Mixed hyperlipidemia: Secondary | ICD-10-CM | POA: Diagnosis not present

## 2016-12-22 DIAGNOSIS — Z79899 Other long term (current) drug therapy: Secondary | ICD-10-CM | POA: Diagnosis not present

## 2016-12-22 DIAGNOSIS — Z658 Other specified problems related to psychosocial circumstances: Secondary | ICD-10-CM | POA: Diagnosis not present

## 2016-12-22 DIAGNOSIS — F17210 Nicotine dependence, cigarettes, uncomplicated: Secondary | ICD-10-CM | POA: Diagnosis not present

## 2016-12-22 DIAGNOSIS — Z7984 Long term (current) use of oral hypoglycemic drugs: Secondary | ICD-10-CM | POA: Diagnosis not present

## 2016-12-22 DIAGNOSIS — E1140 Type 2 diabetes mellitus with diabetic neuropathy, unspecified: Secondary | ICD-10-CM | POA: Diagnosis not present

## 2016-12-22 DIAGNOSIS — E559 Vitamin D deficiency, unspecified: Secondary | ICD-10-CM | POA: Diagnosis not present

## 2016-12-27 DIAGNOSIS — M150 Primary generalized (osteo)arthritis: Secondary | ICD-10-CM | POA: Diagnosis not present

## 2016-12-27 DIAGNOSIS — M2550 Pain in unspecified joint: Secondary | ICD-10-CM | POA: Diagnosis not present

## 2016-12-27 DIAGNOSIS — Z79899 Other long term (current) drug therapy: Secondary | ICD-10-CM | POA: Diagnosis not present

## 2016-12-27 DIAGNOSIS — M1120 Other chondrocalcinosis, unspecified site: Secondary | ICD-10-CM | POA: Diagnosis not present

## 2016-12-27 DIAGNOSIS — E663 Overweight: Secondary | ICD-10-CM | POA: Diagnosis not present

## 2016-12-27 DIAGNOSIS — Z6827 Body mass index (BMI) 27.0-27.9, adult: Secondary | ICD-10-CM | POA: Diagnosis not present

## 2017-02-03 NOTE — Addendum Note (Signed)
Addendum  created 02/03/17 1024 by Effie Berkshire, MD   Sign clinical note

## 2017-06-28 ENCOUNTER — Ambulatory Visit (INDEPENDENT_AMBULATORY_CARE_PROVIDER_SITE_OTHER)
Admission: RE | Admit: 2017-06-28 | Discharge: 2017-06-28 | Disposition: A | Discharge status: Home / Self Care | Payer: Medicare Other | Source: Ambulatory Visit | Attending: Acute Care | Admitting: Acute Care

## 2017-06-28 DIAGNOSIS — Z87891 Personal history of nicotine dependence: Secondary | ICD-10-CM | POA: Diagnosis not present

## 2017-06-28 DIAGNOSIS — F17210 Nicotine dependence, cigarettes, uncomplicated: Secondary | ICD-10-CM

## 2017-06-29 ENCOUNTER — Other Ambulatory Visit: Payer: Self-pay | Admitting: Acute Care

## 2017-06-29 DIAGNOSIS — Z87891 Personal history of nicotine dependence: Secondary | ICD-10-CM

## 2017-06-29 DIAGNOSIS — Z122 Encounter for screening for malignant neoplasm of respiratory organs: Secondary | ICD-10-CM

## 2017-07-05 ENCOUNTER — Other Ambulatory Visit: Payer: Self-pay | Admitting: Internal Medicine

## 2017-07-05 DIAGNOSIS — Z125 Encounter for screening for malignant neoplasm of prostate: Secondary | ICD-10-CM | POA: Diagnosis not present

## 2017-07-05 DIAGNOSIS — M1120 Other chondrocalcinosis, unspecified site: Secondary | ICD-10-CM | POA: Diagnosis not present

## 2017-07-05 DIAGNOSIS — Z658 Other specified problems related to psychosocial circumstances: Secondary | ICD-10-CM | POA: Diagnosis not present

## 2017-07-05 DIAGNOSIS — M179 Osteoarthritis of knee, unspecified: Secondary | ICD-10-CM | POA: Diagnosis not present

## 2017-07-05 DIAGNOSIS — E559 Vitamin D deficiency, unspecified: Secondary | ICD-10-CM | POA: Diagnosis not present

## 2017-07-05 DIAGNOSIS — Z23 Encounter for immunization: Secondary | ICD-10-CM | POA: Diagnosis not present

## 2017-07-05 DIAGNOSIS — E782 Mixed hyperlipidemia: Secondary | ICD-10-CM | POA: Diagnosis not present

## 2017-07-05 DIAGNOSIS — Z0000 Encounter for general adult medical examination without abnormal findings: Secondary | ICD-10-CM | POA: Diagnosis not present

## 2017-07-05 DIAGNOSIS — Z86010 Personal history of colonic polyps: Secondary | ICD-10-CM | POA: Diagnosis not present

## 2017-07-05 DIAGNOSIS — E1140 Type 2 diabetes mellitus with diabetic neuropathy, unspecified: Secondary | ICD-10-CM | POA: Diagnosis not present

## 2017-07-05 DIAGNOSIS — Z79899 Other long term (current) drug therapy: Secondary | ICD-10-CM | POA: Diagnosis not present

## 2017-07-05 DIAGNOSIS — R0989 Other specified symptoms and signs involving the circulatory and respiratory systems: Secondary | ICD-10-CM | POA: Diagnosis not present

## 2017-07-05 DIAGNOSIS — R209 Unspecified disturbances of skin sensation: Secondary | ICD-10-CM | POA: Diagnosis not present

## 2017-07-05 DIAGNOSIS — N5201 Erectile dysfunction due to arterial insufficiency: Secondary | ICD-10-CM | POA: Diagnosis not present

## 2017-07-05 DIAGNOSIS — Z1389 Encounter for screening for other disorder: Secondary | ICD-10-CM | POA: Diagnosis not present

## 2017-07-06 DIAGNOSIS — M2550 Pain in unspecified joint: Secondary | ICD-10-CM | POA: Diagnosis not present

## 2017-07-06 DIAGNOSIS — Z6828 Body mass index (BMI) 28.0-28.9, adult: Secondary | ICD-10-CM | POA: Diagnosis not present

## 2017-07-06 DIAGNOSIS — E663 Overweight: Secondary | ICD-10-CM | POA: Diagnosis not present

## 2017-07-06 DIAGNOSIS — Z79899 Other long term (current) drug therapy: Secondary | ICD-10-CM | POA: Diagnosis not present

## 2017-07-06 DIAGNOSIS — M1120 Other chondrocalcinosis, unspecified site: Secondary | ICD-10-CM | POA: Diagnosis not present

## 2017-07-06 DIAGNOSIS — M150 Primary generalized (osteo)arthritis: Secondary | ICD-10-CM | POA: Diagnosis not present

## 2017-07-12 ENCOUNTER — Ambulatory Visit
Admission: RE | Admit: 2017-07-12 | Discharge: 2017-07-12 | Disposition: A | Discharge status: Home / Self Care | Payer: Medicare Other | Source: Ambulatory Visit | Attending: Internal Medicine | Admitting: Internal Medicine

## 2017-07-12 DIAGNOSIS — I6523 Occlusion and stenosis of bilateral carotid arteries: Secondary | ICD-10-CM | POA: Diagnosis not present

## 2017-07-12 DIAGNOSIS — R0989 Other specified symptoms and signs involving the circulatory and respiratory systems: Secondary | ICD-10-CM

## 2017-07-12 DIAGNOSIS — M170 Bilateral primary osteoarthritis of knee: Secondary | ICD-10-CM | POA: Diagnosis not present

## 2017-07-12 DIAGNOSIS — E782 Mixed hyperlipidemia: Secondary | ICD-10-CM | POA: Diagnosis not present

## 2017-07-12 DIAGNOSIS — Z7984 Long term (current) use of oral hypoglycemic drugs: Secondary | ICD-10-CM | POA: Diagnosis not present

## 2017-07-12 DIAGNOSIS — Z79899 Other long term (current) drug therapy: Secondary | ICD-10-CM | POA: Diagnosis not present

## 2017-07-12 DIAGNOSIS — M150 Primary generalized (osteo)arthritis: Secondary | ICD-10-CM | POA: Diagnosis not present

## 2017-07-12 DIAGNOSIS — E1342 Other specified diabetes mellitus with diabetic polyneuropathy: Secondary | ICD-10-CM | POA: Diagnosis not present

## 2017-07-12 DIAGNOSIS — E1165 Type 2 diabetes mellitus with hyperglycemia: Secondary | ICD-10-CM | POA: Diagnosis not present

## 2017-07-12 DIAGNOSIS — G458 Other transient cerebral ischemic attacks and related syndromes: Secondary | ICD-10-CM | POA: Diagnosis not present

## 2017-08-23 ENCOUNTER — Encounter: Payer: Self-pay | Admitting: Vascular Surgery

## 2017-08-23 ENCOUNTER — Ambulatory Visit (INDEPENDENT_AMBULATORY_CARE_PROVIDER_SITE_OTHER): Payer: Medicare Other | Admitting: Vascular Surgery

## 2017-08-23 VITALS — BP 153/73 | HR 80 | Temp 97.0°F | Resp 16 | Ht 67.0 in | Wt 182.0 lb

## 2017-08-23 DIAGNOSIS — G458 Other transient cerebral ischemic attacks and related syndromes: Secondary | ICD-10-CM

## 2017-08-23 NOTE — Progress Notes (Signed)
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  The consult is requested by Dr. Josetta Huddle.  HPI:   Casey Lynch is a pleasant 71 y.o. male, who apparently had a left carotid bruit.  This prompted a carotid duplex scan that was done on 07/12/2017.  This showed evidence of possible subclavian steal and the patient was sent for vascular consultation.  I have reviewed the records that were sent from the referring office.  The patient has a history of mixed hyperlipidemia which is under good control.  Patient also has diabetes.  The patient is right-handed.  He denies any history of stroke, TIAs, expressive or receptive aphasia, or amaurosis fugax.  He denies any problems with dizziness.  He denies any symptoms in either upper extremity.  Specifically, he denies weakness or paresthesias.  It was noted at a previous office visit and is referring office that the blood pressure in the left arm was lower than the right.  His risk factors for peripheral vascular disease include diabetes, hypercholesterolemia, and history of tobacco use.  Past Medical History:  Diagnosis Date  . Carotid bruit   . Diabetes mellitus without complication ([place])   . Eczema   . History of colonic polyps   . Hypercholesterolemia   . Hyperlipidemia   . Macular degeneration   . Osteoarthritis   . Paresthesias   . Pseudogout     No family history on file.  There is no family history of premature cardiovascular disease.  SOCIAL HISTORY: The patient quit smoking on 09/05/2016. Social History   Socioeconomic History  . Marital status: Married    Spouse name: Not on file  . Number of children: Not on file  . Years of education: Not on file  . Highest education level: Not on file  Social Needs  . Financial resource strain: Not on file  . Food insecurity - worry: Not on file  . Food insecurity - inability: Not on file  .  Transportation needs - medical: Not on file  . Transportation needs - non-medical: Not on file  Occupational History  . Not on file  Tobacco Use  . Smoking status: Former Smoker    Packs/day: 1.00    Years: 57.00    Pack years: 57.00    Types: Cigarettes    Last attempt to quit: 09/05/2016    Years since quitting: 0.9  . Smokeless tobacco: Never Used  Substance and Sexual Activity  . Alcohol use: Yes    Alcohol/week: 0.0 oz    Comment: Rare  . Drug use: No  . Sexual activity: Yes  Other Topics Concern  . Not on file  Social History Narrative  . Not on file    Allergies  Allergen Reactions  . Statins Other (See Comments)    Terrible joint pain  . Tetanus Toxoids Other (See Comments)    Fever 104/105 & arm stiffiness    Current Outpatient Medications  Medication Sig Dispense Refill  . aspirin 81 MG tablet Take 81 mg by mouth daily.    Marland Kitchen b complex vitamins capsule Take 1 capsule by mouth daily.    Marland Kitchen CINNAMON PO Take 1,000 mg by mouth 2 (two) times daily.    . colchicine (COLCRYS) 0.6 MG tablet Take 0.6 mg by mouth daily.    Marland Kitchen glimepiride (AMARYL) 4 MG tablet Take 4 mg by mouth daily with breakfast.    .  metFORMIN (GLUCOPHAGE) 1000 MG tablet Take 1,000 mg by mouth 2 (two) times daily.    . naproxen sodium (ANAPROX) 220 MG tablet Take 220 mg by mouth 2 (two) times daily as needed (for pain.).    Marland Kitchen triamcinolone cream (KENALOG) 0.1 % Apply 1 application topically 2 (two) times daily as needed (for dry skin.).    . Turmeric 1053 MG TABS Take 1,053 mg by mouth.    . Vitamin D, Ergocalciferol, 2000 units CAPS Take by mouth daily.     No current facility-administered medications for this visit.     REVIEW OF SYSTEMS:  [X]  denotes positive finding, [ ]  denotes negative finding Cardiac  Comments:  Chest pain or chest pressure:    Shortness of breath upon exertion: X   Short of breath when lying flat:    Irregular heart rhythm:        Vascular    Pain in calf, thigh, or hip  brought on by ambulation: X   Pain in feet at night that wakes you up from your sleep:     Blood clot in your veins:    Leg swelling:         Pulmonary    Oxygen at home:    Productive cough:     Wheezing:         Neurologic    Sudden weakness in arms or legs:     Sudden numbness in arms or legs:     Sudden onset of difficulty speaking or slurred speech:    Temporary loss of vision in one eye:     Problems with dizziness:         Gastrointestinal    Blood in stool:     Vomited blood:         Genitourinary    Burning when urinating:     Blood in urine:        Psychiatric    Major depression:         Hematologic    Bleeding problems:    Problems with blood clotting too easily:        Skin    Rashes or ulcers:        Constitutional    Fever or chills:     PHYSICAL EXAM:   Vitals:   08/23/17 0856 08/23/17 0900  BP: 129/85 (!) 153/73  Pulse: 80   Resp: 16   Temp: (!) 97 F (36.1 C)   TempSrc: Oral   SpO2: 98%   Weight: 182 lb (82.6 kg)   Height: 5\' 7"  (1.702 m)     GENERAL: The patient is a well-nourished male, in no acute distress. The vital signs are documented above. CARDIAC: There is a regular rate and rhythm.  VASCULAR: He has a left carotid bruit. He has palpable femoral, popliteal, and pedal pulses bilaterally. He has no significant lower extremity swelling. PULMONARY: There is good air exchange bilaterally without wheezing or rales. ABDOMEN: Soft and non-tender with normal pitched bowel sounds.  MUSCULOSKELETAL: There are no major deformities or cyanosis. NEUROLOGIC: No focal weakness or paresthesias are detected. SKIN: There are no ulcers or rashes noted. PSYCHIATRIC: The patient has a normal affect.  DATA:    LABS: GFR on 07/12/2017 was greater than 60.  Hemoglobin was 12.3.  Platelet count 352,000.  CAROTID DUPLEX: I have reviewed the carotid duplex scan that was done on 07/12/2017.  The patient had less than 50% right carotid stenosis and less  than 50% left  carotid stenosis.  However, an incidental finding was retrograde flow in the left vertebral artery possibly consistent with subclavian steal syndrome.  MEDICAL ISSUES:   LEFT SUBCLAVIAN STENOSIS: Based on his exam and duplex study, he has evidence of a left subclavian stenosis.  However, this is asymptomatic.  I explained that we would only consider arteriography and possible angioplasty and stenting if he developed persistent problems with dizziness or other vertebrobasilar symptoms.  Fortunately he quit smoking about a year ago.  I have encouraged him to stay as active as possible.  We have discussed the importance of nutrition.  I be happy to see him back at any time if he develops any new symptoms.  Deitra Mayo [facility] [phone]

## 2017-08-25 DIAGNOSIS — E1342 Other specified diabetes mellitus with diabetic polyneuropathy: Secondary | ICD-10-CM | POA: Diagnosis not present

## 2017-08-25 DIAGNOSIS — E782 Mixed hyperlipidemia: Secondary | ICD-10-CM | POA: Diagnosis not present

## 2017-08-25 DIAGNOSIS — E1165 Type 2 diabetes mellitus with hyperglycemia: Secondary | ICD-10-CM | POA: Diagnosis not present

## 2017-08-25 DIAGNOSIS — Z7984 Long term (current) use of oral hypoglycemic drugs: Secondary | ICD-10-CM | POA: Diagnosis not present

## 2017-08-25 DIAGNOSIS — G458 Other transient cerebral ischemic attacks and related syndromes: Secondary | ICD-10-CM | POA: Diagnosis not present

## 2017-08-25 DIAGNOSIS — M179 Osteoarthritis of knee, unspecified: Secondary | ICD-10-CM | POA: Diagnosis not present

## 2017-08-25 DIAGNOSIS — E1140 Type 2 diabetes mellitus with diabetic neuropathy, unspecified: Secondary | ICD-10-CM | POA: Diagnosis not present

## 2017-10-04 DIAGNOSIS — G458 Other transient cerebral ischemic attacks and related syndromes: Secondary | ICD-10-CM | POA: Diagnosis not present

## 2017-10-04 DIAGNOSIS — M179 Osteoarthritis of knee, unspecified: Secondary | ICD-10-CM | POA: Diagnosis not present

## 2017-10-04 DIAGNOSIS — E1165 Type 2 diabetes mellitus with hyperglycemia: Secondary | ICD-10-CM | POA: Diagnosis not present

## 2017-10-04 DIAGNOSIS — E782 Mixed hyperlipidemia: Secondary | ICD-10-CM | POA: Diagnosis not present

## 2017-10-04 DIAGNOSIS — Z7984 Long term (current) use of oral hypoglycemic drugs: Secondary | ICD-10-CM | POA: Diagnosis not present

## 2017-10-04 DIAGNOSIS — E1342 Other specified diabetes mellitus with diabetic polyneuropathy: Secondary | ICD-10-CM | POA: Diagnosis not present

## 2017-11-13 DIAGNOSIS — E119 Type 2 diabetes mellitus without complications: Secondary | ICD-10-CM | POA: Diagnosis not present

## 2017-11-13 DIAGNOSIS — H401111 Primary open-angle glaucoma, right eye, mild stage: Secondary | ICD-10-CM | POA: Diagnosis not present

## 2017-11-13 DIAGNOSIS — H40053 Ocular hypertension, bilateral: Secondary | ICD-10-CM | POA: Diagnosis not present

## 2017-11-13 DIAGNOSIS — H40022 Open angle with borderline findings, high risk, left eye: Secondary | ICD-10-CM | POA: Diagnosis not present

## 2017-12-05 DIAGNOSIS — H40051 Ocular hypertension, right eye: Secondary | ICD-10-CM | POA: Diagnosis not present

## 2017-12-05 DIAGNOSIS — H401111 Primary open-angle glaucoma, right eye, mild stage: Secondary | ICD-10-CM | POA: Diagnosis not present

## 2017-12-07 DIAGNOSIS — E1342 Other specified diabetes mellitus with diabetic polyneuropathy: Secondary | ICD-10-CM | POA: Diagnosis not present

## 2017-12-07 DIAGNOSIS — G458 Other transient cerebral ischemic attacks and related syndromes: Secondary | ICD-10-CM | POA: Diagnosis not present

## 2017-12-07 DIAGNOSIS — E1165 Type 2 diabetes mellitus with hyperglycemia: Secondary | ICD-10-CM | POA: Diagnosis not present

## 2017-12-07 DIAGNOSIS — H6981 Other specified disorders of Eustachian tube, right ear: Secondary | ICD-10-CM | POA: Diagnosis not present

## 2017-12-07 DIAGNOSIS — J309 Allergic rhinitis, unspecified: Secondary | ICD-10-CM | POA: Diagnosis not present

## 2017-12-07 DIAGNOSIS — Z7984 Long term (current) use of oral hypoglycemic drugs: Secondary | ICD-10-CM | POA: Diagnosis not present

## 2017-12-07 DIAGNOSIS — R030 Elevated blood-pressure reading, without diagnosis of hypertension: Secondary | ICD-10-CM | POA: Diagnosis not present

## 2017-12-14 DIAGNOSIS — D1801 Hemangioma of skin and subcutaneous tissue: Secondary | ICD-10-CM | POA: Diagnosis not present

## 2017-12-14 DIAGNOSIS — L821 Other seborrheic keratosis: Secondary | ICD-10-CM | POA: Diagnosis not present

## 2017-12-14 DIAGNOSIS — L812 Freckles: Secondary | ICD-10-CM | POA: Diagnosis not present

## 2017-12-14 DIAGNOSIS — L308 Other specified dermatitis: Secondary | ICD-10-CM | POA: Diagnosis not present

## 2017-12-14 DIAGNOSIS — D225 Melanocytic nevi of trunk: Secondary | ICD-10-CM | POA: Diagnosis not present

## 2017-12-14 DIAGNOSIS — L570 Actinic keratosis: Secondary | ICD-10-CM | POA: Diagnosis not present

## 2017-12-19 DIAGNOSIS — H40052 Ocular hypertension, left eye: Secondary | ICD-10-CM | POA: Diagnosis not present

## 2017-12-19 DIAGNOSIS — H40022 Open angle with borderline findings, high risk, left eye: Secondary | ICD-10-CM | POA: Diagnosis not present

## 2018-01-03 DIAGNOSIS — J449 Chronic obstructive pulmonary disease, unspecified: Secondary | ICD-10-CM | POA: Diagnosis not present

## 2018-01-03 DIAGNOSIS — R0602 Shortness of breath: Secondary | ICD-10-CM | POA: Diagnosis not present

## 2018-01-03 DIAGNOSIS — M1990 Unspecified osteoarthritis, unspecified site: Secondary | ICD-10-CM | POA: Diagnosis not present

## 2018-01-03 DIAGNOSIS — K219 Gastro-esophageal reflux disease without esophagitis: Secondary | ICD-10-CM | POA: Diagnosis not present

## 2018-01-04 DIAGNOSIS — M1120 Other chondrocalcinosis, unspecified site: Secondary | ICD-10-CM | POA: Diagnosis not present

## 2018-01-04 DIAGNOSIS — M2550 Pain in unspecified joint: Secondary | ICD-10-CM | POA: Diagnosis not present

## 2018-01-04 DIAGNOSIS — Z6829 Body mass index (BMI) 29.0-29.9, adult: Secondary | ICD-10-CM | POA: Diagnosis not present

## 2018-01-04 DIAGNOSIS — Z79899 Other long term (current) drug therapy: Secondary | ICD-10-CM | POA: Diagnosis not present

## 2018-01-04 DIAGNOSIS — E663 Overweight: Secondary | ICD-10-CM | POA: Diagnosis not present

## 2018-01-04 DIAGNOSIS — M150 Primary generalized (osteo)arthritis: Secondary | ICD-10-CM | POA: Diagnosis not present

## 2018-01-09 DIAGNOSIS — H401111 Primary open-angle glaucoma, right eye, mild stage: Secondary | ICD-10-CM | POA: Diagnosis not present

## 2018-01-09 DIAGNOSIS — H40053 Ocular hypertension, bilateral: Secondary | ICD-10-CM | POA: Diagnosis not present

## 2018-01-19 DIAGNOSIS — G4733 Obstructive sleep apnea (adult) (pediatric): Secondary | ICD-10-CM | POA: Diagnosis not present

## 2018-01-19 DIAGNOSIS — R9431 Abnormal electrocardiogram [ECG] [EKG]: Secondary | ICD-10-CM | POA: Diagnosis not present

## 2018-01-19 DIAGNOSIS — I509 Heart failure, unspecified: Secondary | ICD-10-CM | POA: Diagnosis not present

## 2018-01-19 DIAGNOSIS — R0602 Shortness of breath: Secondary | ICD-10-CM | POA: Diagnosis not present

## 2018-01-19 DIAGNOSIS — J0100 Acute maxillary sinusitis, unspecified: Secondary | ICD-10-CM | POA: Diagnosis not present

## 2018-01-19 DIAGNOSIS — R079 Chest pain, unspecified: Secondary | ICD-10-CM | POA: Diagnosis not present

## 2018-01-19 DIAGNOSIS — R0600 Dyspnea, unspecified: Secondary | ICD-10-CM | POA: Diagnosis not present

## 2018-01-19 DIAGNOSIS — Z7984 Long term (current) use of oral hypoglycemic drugs: Secondary | ICD-10-CM | POA: Diagnosis not present

## 2018-01-19 DIAGNOSIS — R7989 Other specified abnormal findings of blood chemistry: Secondary | ICD-10-CM | POA: Diagnosis not present

## 2018-01-19 DIAGNOSIS — Z7982 Long term (current) use of aspirin: Secondary | ICD-10-CM | POA: Diagnosis not present

## 2018-01-19 DIAGNOSIS — E119 Type 2 diabetes mellitus without complications: Secondary | ICD-10-CM | POA: Diagnosis not present

## 2018-01-19 DIAGNOSIS — R000 Tachycardia, unspecified: Secondary | ICD-10-CM | POA: Diagnosis not present

## 2018-01-19 DIAGNOSIS — I5021 Acute systolic (congestive) heart failure: Secondary | ICD-10-CM | POA: Diagnosis not present

## 2018-01-19 DIAGNOSIS — R062 Wheezing: Secondary | ICD-10-CM | POA: Diagnosis not present

## 2018-01-19 DIAGNOSIS — R05 Cough: Secondary | ICD-10-CM | POA: Diagnosis not present

## 2018-01-19 DIAGNOSIS — Z86010 Personal history of colonic polyps: Secondary | ICD-10-CM | POA: Diagnosis not present

## 2018-01-19 DIAGNOSIS — I340 Nonrheumatic mitral (valve) insufficiency: Secondary | ICD-10-CM | POA: Diagnosis not present

## 2018-01-19 DIAGNOSIS — I4510 Unspecified right bundle-branch block: Secondary | ICD-10-CM | POA: Diagnosis not present

## 2018-01-19 DIAGNOSIS — I110 Hypertensive heart disease with heart failure: Secondary | ICD-10-CM | POA: Diagnosis not present

## 2018-01-19 DIAGNOSIS — E874 Mixed disorder of acid-base balance: Secondary | ICD-10-CM | POA: Diagnosis not present

## 2018-01-19 DIAGNOSIS — J449 Chronic obstructive pulmonary disease, unspecified: Secondary | ICD-10-CM | POA: Diagnosis not present

## 2018-01-19 DIAGNOSIS — Z87891 Personal history of nicotine dependence: Secondary | ICD-10-CM | POA: Diagnosis not present

## 2018-01-19 DIAGNOSIS — I272 Pulmonary hypertension, unspecified: Secondary | ICD-10-CM | POA: Diagnosis not present

## 2018-01-19 DIAGNOSIS — E785 Hyperlipidemia, unspecified: Secondary | ICD-10-CM | POA: Diagnosis not present

## 2018-01-19 DIAGNOSIS — I208 Other forms of angina pectoris: Secondary | ICD-10-CM | POA: Diagnosis not present

## 2018-01-19 DIAGNOSIS — B9789 Other viral agents as the cause of diseases classified elsewhere: Secondary | ICD-10-CM | POA: Diagnosis not present

## 2018-01-21 DIAGNOSIS — I5021 Acute systolic (congestive) heart failure: Secondary | ICD-10-CM | POA: Diagnosis not present

## 2018-01-22 DIAGNOSIS — R9439 Abnormal result of other cardiovascular function study: Secondary | ICD-10-CM | POA: Diagnosis not present

## 2018-01-22 DIAGNOSIS — I429 Cardiomyopathy, unspecified: Secondary | ICD-10-CM | POA: Diagnosis not present

## 2018-01-22 DIAGNOSIS — I509 Heart failure, unspecified: Secondary | ICD-10-CM | POA: Diagnosis not present

## 2018-01-22 DIAGNOSIS — E669 Obesity, unspecified: Secondary | ICD-10-CM | POA: Diagnosis present

## 2018-01-22 DIAGNOSIS — D72829 Elevated white blood cell count, unspecified: Secondary | ICD-10-CM | POA: Diagnosis not present

## 2018-01-22 DIAGNOSIS — I272 Pulmonary hypertension, unspecified: Secondary | ICD-10-CM | POA: Diagnosis not present

## 2018-01-22 DIAGNOSIS — J019 Acute sinusitis, unspecified: Secondary | ICD-10-CM | POA: Diagnosis not present

## 2018-01-22 DIAGNOSIS — Z888 Allergy status to other drugs, medicaments and biological substances status: Secondary | ICD-10-CM | POA: Diagnosis not present

## 2018-01-22 DIAGNOSIS — J449 Chronic obstructive pulmonary disease, unspecified: Secondary | ICD-10-CM | POA: Diagnosis not present

## 2018-01-22 DIAGNOSIS — Z9889 Other specified postprocedural states: Secondary | ICD-10-CM | POA: Diagnosis not present

## 2018-01-22 DIAGNOSIS — I2510 Atherosclerotic heart disease of native coronary artery without angina pectoris: Secondary | ICD-10-CM | POA: Diagnosis not present

## 2018-01-22 DIAGNOSIS — I110 Hypertensive heart disease with heart failure: Secondary | ICD-10-CM | POA: Diagnosis not present

## 2018-01-22 DIAGNOSIS — Z887 Allergy status to serum and vaccine status: Secondary | ICD-10-CM | POA: Diagnosis not present

## 2018-01-22 DIAGNOSIS — Z7984 Long term (current) use of oral hypoglycemic drugs: Secondary | ICD-10-CM | POA: Diagnosis not present

## 2018-01-22 DIAGNOSIS — I428 Other cardiomyopathies: Secondary | ICD-10-CM | POA: Diagnosis not present

## 2018-01-22 DIAGNOSIS — I4510 Unspecified right bundle-branch block: Secondary | ICD-10-CM | POA: Diagnosis present

## 2018-01-22 DIAGNOSIS — R0602 Shortness of breath: Secondary | ICD-10-CM | POA: Diagnosis not present

## 2018-01-22 DIAGNOSIS — M109 Gout, unspecified: Secondary | ICD-10-CM | POA: Diagnosis present

## 2018-01-22 DIAGNOSIS — R0600 Dyspnea, unspecified: Secondary | ICD-10-CM | POA: Diagnosis not present

## 2018-01-22 DIAGNOSIS — I081 Rheumatic disorders of both mitral and tricuspid valves: Secondary | ICD-10-CM | POA: Diagnosis present

## 2018-01-22 DIAGNOSIS — J9601 Acute respiratory failure with hypoxia: Secondary | ICD-10-CM | POA: Diagnosis not present

## 2018-01-22 DIAGNOSIS — Z87891 Personal history of nicotine dependence: Secondary | ICD-10-CM | POA: Diagnosis not present

## 2018-01-22 DIAGNOSIS — J439 Emphysema, unspecified: Secondary | ICD-10-CM | POA: Diagnosis present

## 2018-01-22 DIAGNOSIS — E871 Hypo-osmolality and hyponatremia: Secondary | ICD-10-CM | POA: Diagnosis present

## 2018-01-22 DIAGNOSIS — I70208 Unspecified atherosclerosis of native arteries of extremities, other extremity: Secondary | ICD-10-CM | POA: Diagnosis present

## 2018-01-22 DIAGNOSIS — Z7982 Long term (current) use of aspirin: Secondary | ICD-10-CM | POA: Diagnosis not present

## 2018-01-22 DIAGNOSIS — E785 Hyperlipidemia, unspecified: Secondary | ICD-10-CM | POA: Diagnosis present

## 2018-01-22 DIAGNOSIS — G4733 Obstructive sleep apnea (adult) (pediatric): Secondary | ICD-10-CM | POA: Diagnosis not present

## 2018-01-22 DIAGNOSIS — Z8249 Family history of ischemic heart disease and other diseases of the circulatory system: Secondary | ICD-10-CM | POA: Diagnosis not present

## 2018-01-22 DIAGNOSIS — Z79899 Other long term (current) drug therapy: Secondary | ICD-10-CM | POA: Diagnosis not present

## 2018-01-22 DIAGNOSIS — I5021 Acute systolic (congestive) heart failure: Secondary | ICD-10-CM | POA: Diagnosis not present

## 2018-01-22 DIAGNOSIS — E1165 Type 2 diabetes mellitus with hyperglycemia: Secondary | ICD-10-CM | POA: Diagnosis present

## 2018-01-22 DIAGNOSIS — E7800 Pure hypercholesterolemia, unspecified: Secondary | ICD-10-CM | POA: Diagnosis not present

## 2018-01-22 DIAGNOSIS — I5031 Acute diastolic (congestive) heart failure: Secondary | ICD-10-CM | POA: Diagnosis present

## 2018-01-22 DIAGNOSIS — J329 Chronic sinusitis, unspecified: Secondary | ICD-10-CM | POA: Diagnosis present

## 2018-01-22 DIAGNOSIS — E119 Type 2 diabetes mellitus without complications: Secondary | ICD-10-CM | POA: Diagnosis not present

## 2018-02-07 DIAGNOSIS — I10 Essential (primary) hypertension: Secondary | ICD-10-CM | POA: Diagnosis not present

## 2018-02-07 DIAGNOSIS — Z6826 Body mass index (BMI) 26.0-26.9, adult: Secondary | ICD-10-CM | POA: Diagnosis not present

## 2018-02-07 DIAGNOSIS — Z0000 Encounter for general adult medical examination without abnormal findings: Secondary | ICD-10-CM | POA: Diagnosis not present

## 2018-02-07 DIAGNOSIS — I272 Pulmonary hypertension, unspecified: Secondary | ICD-10-CM | POA: Diagnosis not present

## 2018-02-07 DIAGNOSIS — I5021 Acute systolic (congestive) heart failure: Secondary | ICD-10-CM | POA: Diagnosis not present

## 2018-02-07 DIAGNOSIS — E119 Type 2 diabetes mellitus without complications: Secondary | ICD-10-CM | POA: Diagnosis not present

## 2018-02-07 DIAGNOSIS — J449 Chronic obstructive pulmonary disease, unspecified: Secondary | ICD-10-CM | POA: Diagnosis not present

## 2018-02-19 DIAGNOSIS — I5022 Chronic systolic (congestive) heart failure: Secondary | ICD-10-CM | POA: Diagnosis not present

## 2018-02-20 DIAGNOSIS — Z87891 Personal history of nicotine dependence: Secondary | ICD-10-CM | POA: Diagnosis not present

## 2018-02-20 DIAGNOSIS — I5022 Chronic systolic (congestive) heart failure: Secondary | ICD-10-CM | POA: Diagnosis not present

## 2018-02-28 DIAGNOSIS — Z7984 Long term (current) use of oral hypoglycemic drugs: Secondary | ICD-10-CM | POA: Diagnosis not present

## 2018-02-28 DIAGNOSIS — E782 Mixed hyperlipidemia: Secondary | ICD-10-CM | POA: Diagnosis not present

## 2018-02-28 DIAGNOSIS — J449 Chronic obstructive pulmonary disease, unspecified: Secondary | ICD-10-CM | POA: Diagnosis not present

## 2018-02-28 DIAGNOSIS — M179 Osteoarthritis of knee, unspecified: Secondary | ICD-10-CM | POA: Diagnosis not present

## 2018-02-28 DIAGNOSIS — E1140 Type 2 diabetes mellitus with diabetic neuropathy, unspecified: Secondary | ICD-10-CM | POA: Diagnosis not present

## 2018-02-28 DIAGNOSIS — I5022 Chronic systolic (congestive) heart failure: Secondary | ICD-10-CM | POA: Diagnosis not present

## 2018-02-28 DIAGNOSIS — E1165 Type 2 diabetes mellitus with hyperglycemia: Secondary | ICD-10-CM | POA: Diagnosis not present

## 2018-02-28 DIAGNOSIS — G458 Other transient cerebral ischemic attacks and related syndromes: Secondary | ICD-10-CM | POA: Diagnosis not present

## 2018-02-28 DIAGNOSIS — M1990 Unspecified osteoarthritis, unspecified site: Secondary | ICD-10-CM | POA: Diagnosis not present

## 2018-03-14 DIAGNOSIS — R5383 Other fatigue: Secondary | ICD-10-CM | POA: Diagnosis not present

## 2018-03-14 DIAGNOSIS — I509 Heart failure, unspecified: Secondary | ICD-10-CM | POA: Diagnosis not present

## 2018-03-14 DIAGNOSIS — G4710 Hypersomnia, unspecified: Secondary | ICD-10-CM | POA: Diagnosis not present

## 2018-03-14 DIAGNOSIS — R0683 Snoring: Secondary | ICD-10-CM | POA: Diagnosis not present

## 2018-03-16 DIAGNOSIS — G4752 REM sleep behavior disorder: Secondary | ICD-10-CM | POA: Diagnosis not present

## 2018-03-16 DIAGNOSIS — G4733 Obstructive sleep apnea (adult) (pediatric): Secondary | ICD-10-CM | POA: Diagnosis not present

## 2018-03-21 DIAGNOSIS — Z87891 Personal history of nicotine dependence: Secondary | ICD-10-CM | POA: Diagnosis not present

## 2018-03-21 DIAGNOSIS — E785 Hyperlipidemia, unspecified: Secondary | ICD-10-CM | POA: Diagnosis not present

## 2018-03-21 DIAGNOSIS — I110 Hypertensive heart disease with heart failure: Secondary | ICD-10-CM | POA: Diagnosis not present

## 2018-03-21 DIAGNOSIS — I5022 Chronic systolic (congestive) heart failure: Secondary | ICD-10-CM | POA: Diagnosis not present

## 2018-04-10 DIAGNOSIS — G4733 Obstructive sleep apnea (adult) (pediatric): Secondary | ICD-10-CM | POA: Diagnosis not present

## 2018-04-10 DIAGNOSIS — G4752 REM sleep behavior disorder: Secondary | ICD-10-CM | POA: Diagnosis not present

## 2018-04-11 DIAGNOSIS — E119 Type 2 diabetes mellitus without complications: Secondary | ICD-10-CM | POA: Diagnosis not present

## 2018-04-19 DIAGNOSIS — I5022 Chronic systolic (congestive) heart failure: Secondary | ICD-10-CM | POA: Diagnosis not present

## 2018-04-20 DIAGNOSIS — E785 Hyperlipidemia, unspecified: Secondary | ICD-10-CM | POA: Diagnosis not present

## 2018-04-20 DIAGNOSIS — I5022 Chronic systolic (congestive) heart failure: Secondary | ICD-10-CM | POA: Diagnosis not present

## 2018-04-20 DIAGNOSIS — E119 Type 2 diabetes mellitus without complications: Secondary | ICD-10-CM | POA: Diagnosis not present

## 2018-04-20 DIAGNOSIS — Z87891 Personal history of nicotine dependence: Secondary | ICD-10-CM | POA: Diagnosis not present

## 2018-04-27 DIAGNOSIS — I5022 Chronic systolic (congestive) heart failure: Secondary | ICD-10-CM | POA: Diagnosis not present

## 2018-05-29 DIAGNOSIS — I5022 Chronic systolic (congestive) heart failure: Secondary | ICD-10-CM | POA: Diagnosis not present

## 2018-06-08 DIAGNOSIS — H40053 Ocular hypertension, bilateral: Secondary | ICD-10-CM | POA: Diagnosis not present

## 2018-06-08 DIAGNOSIS — H401111 Primary open-angle glaucoma, right eye, mild stage: Secondary | ICD-10-CM | POA: Diagnosis not present

## 2018-06-14 ENCOUNTER — Ambulatory Visit (INDEPENDENT_AMBULATORY_CARE_PROVIDER_SITE_OTHER): Payer: Medicare Other | Admitting: Pulmonary Disease

## 2018-06-14 ENCOUNTER — Encounter: Payer: Self-pay | Admitting: Pulmonary Disease

## 2018-06-14 VITALS — BP 138/82 | HR 83 | Ht 66.0 in | Wt 180.2 lb

## 2018-06-14 DIAGNOSIS — G4733 Obstructive sleep apnea (adult) (pediatric): Secondary | ICD-10-CM

## 2018-06-14 DIAGNOSIS — J439 Emphysema, unspecified: Secondary | ICD-10-CM | POA: Diagnosis not present

## 2018-06-14 DIAGNOSIS — I272 Pulmonary hypertension, unspecified: Secondary | ICD-10-CM | POA: Insufficient documentation

## 2018-06-14 DIAGNOSIS — R0602 Shortness of breath: Secondary | ICD-10-CM

## 2018-06-14 NOTE — Patient Instructions (Signed)
We will start you on CPAP at 15 cm of water.  Follow-up in 1 to 2 months with download for review We will get pulmonary function test for evaluation of COPD Follow-up with the cardiologist for management of heart failure

## 2018-06-14 NOTE — Progress Notes (Addendum)
Casey Lynch    [id number]    1946/08/05  Primary Care Physician:Gates, Herbie [place], MD  Referring Physician: Josetta Huddle, MD [address] [place], [place] [address]  Chief complaint: Consult for dyspnea, pulmonary hypertension  HPI: 72 year old with history of diabetes, heart failure, nonischemic cardiomyopathy, hyperlipidemia, recent diagnosis of obstructive sleep apnea. Hospitalized for dyspnea and congestion.  Found to have heart failure in May 2019 with stress test showing small anterior wall ischemic defect.  Cardiac catheterization showed mild coronary artery disease Echocardiogram in May 2019 shows depressed ejection fraction of 37% and mild pulmonary hypertension. He had an evaluation of sleep apnea which showed moderate OSA with desaturations.  He has not started CPAP therapy at. He is scheduled to follow-up with cardiology on 07/11/2018.  Feels well at present with no respiratory complaints.  Denies any cough, sputum production, dyspnea, wheezing.  Pets: Has a dog.  No cats, birds, farm animals Occupation: Retired Company secretary Smoking history: 60-pack-year smoker.  Quit in 2017 Travel history: Travels to [place] for several months every year.  No other significant travel Relevant family history: No significant family history of lung disease.  Outpatient Encounter Medications as of 06/14/2018  Medication Sig  . aspirin 81 MG tablet Take 81 mg by mouth daily.  Marland Kitchen b complex vitamins capsule Take 1 capsule by mouth daily.  . colchicine (COLCRYS) 0.6 MG tablet Take 0.6 mg by mouth daily.  . furosemide (LASIX) 20 MG tablet Take 20 mg by mouth.  Marland Kitchen glimepiride (AMARYL) 4 MG tablet Take 4 mg by mouth daily with breakfast.  . metFORMIN (GLUCOPHAGE) 1000 MG tablet Take 1,000 mg by mouth 2 (two) times daily.  . naproxen sodium (ANAPROX) 220 MG tablet Take 220 mg by mouth 2 (two) times daily as needed (for pain.).  Marland Kitchen ranitidine (ZANTAC) 150 MG tablet Take 150 mg by  mouth 2 (two) times daily.  . sacubitril-valsartan (ENTRESTO) 97-103 MG Take 1 tablet by mouth 2 (two) times daily.  Marland Kitchen triamcinolone cream (KENALOG) 0.1 % Apply 1 application topically 2 (two) times daily as needed (for dry skin.).  . Turmeric 500 MG CAPS Take 1 capsule by mouth daily.  . Vitamin D, Ergocalciferol, 2000 units CAPS Take by mouth daily.  . [DISCONTINUED] CINNAMON PO Take 1,000 mg by mouth 2 (two) times daily.  . [DISCONTINUED] colchicine 0.6 MG tablet Take 0.6 mg by mouth daily.  . [DISCONTINUED] glimepiride (AMARYL) 4 MG tablet Take 4 mg by mouth daily with breakfast.  . [DISCONTINUED] Turmeric 1053 MG TABS Take 1,053 mg by mouth.   No facility-administered encounter medications on file as of 06/14/2018.     Allergies as of 06/14/2018 - Review Complete 06/14/2018  Allergen Reaction Noted  . Statins Other (See Comments) 10/19/2016  . Tetanus toxoids Other (See Comments) 10/19/2016    Past Medical History:  Diagnosis Date  . Carotid bruit   . Diabetes mellitus without complication ([place])   . Eczema   . Heart failure ([place])   . History of colonic polyps   . Hypercholesterolemia   . Hyperlipidemia   . Macular degeneration   . Osteoarthritis   . Paresthesias   . Pseudogout   . Subclavian steal syndrome     Past Surgical History:  Procedure Laterality Date  . CARDIAC CATHETERIZATION    . COLONOSCOPY WITH PROPOFOL N/A 10/24/2016   Procedure: COLONOSCOPY WITH PROPOFOL;  Surgeon: Garlan Fair, MD;  Location: [facility] ENDOSCOPY;  Service: Endoscopy;  Laterality: N/A;  . HYDROCELE EXCISION / REPAIR    . KNEE SURGERY    . REFRACTIVE SURGERY    . TONSILLECTOMY      Family History  Problem Relation Age of Onset  . Heart failure Father   . Bipolar disorder Brother   . Diabetes Brother   . Cancer Paternal Grandmother     Social History   Socioeconomic History  . Marital status: Married    Spouse name: Not on file  . Number of children: Not on file  . Years of  education: Not on file  . Highest education level: Not on file  Occupational History  . Not on file  Social Needs  . Financial resource strain: Not on file  . Food insecurity:    Worry: Not on file    Inability: Not on file  . Transportation needs:    Medical: Not on file    Non-medical: Not on file  Tobacco Use  . Smoking status: Former Smoker    Packs/day: 1.00    Years: 59.00    Pack years: 59.00    Types: Cigarettes    Last attempt to quit: 09/05/2016    Years since quitting: 1.7  . Smokeless tobacco: Never Used  Substance and Sexual Activity  . Alcohol use: Yes    Alcohol/week: 0.0 standard drinks    Comment: Rare  . Drug use: No  . Sexual activity: Yes  Lifestyle  . Physical activity:    Days per week: Not on file    Minutes per session: Not on file  . Stress: Not on file  Relationships  . Social connections:    Talks on phone: Not on file    Gets together: Not on file    Attends religious service: Not on file    Active member of club or organization: Not on file    Attends meetings of clubs or organizations: Not on file    Relationship status: Not on file  . Intimate partner violence:    Fear of current or ex partner: Not on file    Emotionally abused: Not on file    Physically abused: Not on file    Forced sexual activity: Not on file  Other Topics Concern  . Not on file  Social History Narrative  . Not on file    Review of systems: Review of Systems  Constitutional: Negative for fever and chills.  HENT: Negative.   Eyes: Negative for blurred vision.  Respiratory: as per HPI  Cardiovascular: Negative for chest pain and palpitations.  Gastrointestinal: Negative for vomiting, diarrhea, blood per rectum. Genitourinary: Negative for dysuria, urgency, frequency and hematuria.  Musculoskeletal: Negative for myalgias, back pain and joint pain.  Skin: Negative for itching and rash.  Neurological: Negative for dizziness, tremors, focal weakness, seizures and  loss of consciousness.  Endo/Heme/Allergies: Negative for environmental allergies.  Psychiatric/Behavioral: Negative for depression, suicidal ideas and hallucinations.  All other systems reviewed and are negative.  Physical Exam: Blood pressure 138/82, pulse 83, height 5\' 6"  (1.676 m), weight 180 lb 3.2 oz (81.7 kg), SpO2 95 %. Gen:      No acute distress HEENT:  EOMI, sclera anicteric Neck:     No masses; no thyromegaly Lungs:    Clear to auscultation bilaterally; normal respiratory effort CV:         Regular rate and rhythm; no murmurs Abd:      + bowel sounds; soft, non-tender; no palpable masses, no distension Ext:  No edema; adequate peripheral perfusion Skin:      Warm and dry; no rash Neuro: alert and oriented x 3 Psych: normal mood and affect  Data Reviewed: Imaging: CT screening 06/28/2017- tiny pulmonary nodules measuring up to 2.2 mm.  Mild diffuse bronchial thickening, mild centrilobular and paraseptal emphysema.  I reviewed the images personally.  CT angio [outside report] 01/19/2018- No focal consolidation or infiltrate, mild dependent atelectasis, mild interlobular septal thickening, mild mediastinal adenopathy, mild emphysema  PFTs: Pending  Labs:   Sleep: Sleep study 03/16/18- moderate sleep apnea with overall AHI 22, desats to 88%. CPAP titration 04/10/18- recommend CPAP at 15 cm of water.  Assessment:  Pulmonary hypertension Likely secondary to left heart disease from nonischemic cardiomyopathy, sleep apnea and emphysema. Start treatment for sleep apnea  Sleep apnea Titration study reviewed.  Order CPAP 15 Follow-up in 1 to 2 months for compliance check and download review.  Emphysema  We will get pulmonary function test to evaluate.  Since he is asymptomatic we will observe off inhalers  Plan/Recommendations: - Start CPAP at 15 cm of water - Pulmonary function test  Marshell Garfinkel MD [facility] 06/14/2018, 3:25 PM  CC:  Josetta Huddle, MD

## 2018-06-18 ENCOUNTER — Telehealth: Payer: Self-pay | Admitting: Pulmonary Disease

## 2018-06-18 NOTE — Telephone Encounter (Signed)
Went and talked with patient and wife in lobby. They had a disc with images for the recent CT patient had and Dr. Vaughan Browner wanted.   Disc handed to [facility] and she put in Dr. Matilde Bash sign folder.   Patient also wondering if he still needs to go have PFTs done in December after Dr. Vaughan Browner looks over the images.   Will route to Dr. Vaughan Browner to advised on need for PFT Will route to [facility] as follow up with disc.

## 2018-06-20 NOTE — Telephone Encounter (Signed)
I have reviewed the CT scan and confirmed that there is no pneumonia or obvious interstitial lung disease. We should get the PFTs as there is emphysema on CT and PFTs will give [place] a better idea of this

## 2018-06-20 NOTE — Telephone Encounter (Signed)
lmtcb x1 for pt to relay below message.

## 2018-06-21 NOTE — Telephone Encounter (Signed)
Spoke with pt. He is aware of Dr. Matilde Bash response. Pt is scheduled for a PFT on 08/08/18. Nothing further was needed.

## 2018-07-02 ENCOUNTER — Telehealth: Payer: Self-pay | Admitting: Pulmonary Disease

## 2018-07-02 DIAGNOSIS — G4733 Obstructive sleep apnea (adult) (pediatric): Secondary | ICD-10-CM

## 2018-07-02 NOTE — Telephone Encounter (Signed)
Patient states he is in [place] from May - October and will need DME that can service him there as well as here.  Would like new order put in to [facility] for CPAP.  CB [phone] or [phone]. Patient requests a call back when new order has been placed.

## 2018-07-02 NOTE — Telephone Encounter (Signed)
Type Date User Summary Attachment  General 06/15/2018 10:41 AM [name] - -  Note   Confirmation received from [facility] sleep study faxed to [facility]         Per patient's chart, order was sent to [facility] on 06/14/18. Left message for patient to call back.

## 2018-07-02 NOTE — Telephone Encounter (Signed)
Dr. Vaughan Browner, please advise if you are ok with [place] switching his DME to [facility] in [place]. Thanks!

## 2018-07-04 NOTE — Telephone Encounter (Signed)
Dr. Mannam please advise. °

## 2018-07-04 NOTE — Telephone Encounter (Signed)
Ok to switch 

## 2018-07-04 NOTE — Telephone Encounter (Signed)
Order placed to switch to [place] Pt aware  Nothing further needed

## 2018-07-11 ENCOUNTER — Encounter: Payer: Self-pay | Admitting: Cardiology

## 2018-07-11 ENCOUNTER — Ambulatory Visit (INDEPENDENT_AMBULATORY_CARE_PROVIDER_SITE_OTHER): Payer: Medicare Other | Admitting: Cardiology

## 2018-07-11 VITALS — BP 130/70 | HR 81 | Ht 66.5 in | Wt 183.8 lb

## 2018-07-11 DIAGNOSIS — I5042 Chronic combined systolic (congestive) and diastolic (congestive) heart failure: Secondary | ICD-10-CM | POA: Diagnosis not present

## 2018-07-11 DIAGNOSIS — E782 Mixed hyperlipidemia: Secondary | ICD-10-CM

## 2018-07-11 DIAGNOSIS — Z789 Other specified health status: Secondary | ICD-10-CM

## 2018-07-11 DIAGNOSIS — Z7189 Other specified counseling: Secondary | ICD-10-CM | POA: Diagnosis not present

## 2018-07-11 DIAGNOSIS — I2510 Atherosclerotic heart disease of native coronary artery without angina pectoris: Secondary | ICD-10-CM | POA: Diagnosis not present

## 2018-07-11 DIAGNOSIS — E1169 Type 2 diabetes mellitus with other specified complication: Secondary | ICD-10-CM

## 2018-07-11 DIAGNOSIS — I2583 Coronary atherosclerosis due to lipid rich plaque: Secondary | ICD-10-CM

## 2018-07-11 DIAGNOSIS — Z79899 Other long term (current) drug therapy: Secondary | ICD-10-CM | POA: Diagnosis not present

## 2018-07-11 LAB — COMPREHENSIVE METABOLIC PANEL
ALT: 40 IU/L (ref 0–44)
AST: 26 IU/L (ref 0–40)
Albumin/Globulin Ratio: 1.8 (ref 1.2–2.2)
Albumin: 4.6 g/dL (ref 3.5–4.8)
Alkaline Phosphatase: 107 IU/L (ref 39–117)
BUN/Creatinine Ratio: 17 (ref 10–24)
BUN: 20 mg/dL (ref 8–27)
Bilirubin Total: 0.3 mg/dL (ref 0.0–1.2)
CO2: 22 mmol/L (ref 20–29)
Calcium: 10.1 mg/dL (ref 8.6–10.2)
Chloride: 96 mmol/L (ref 96–106)
Creatinine, Ser: 1.2 mg/dL (ref 0.76–1.27)
GFR calc Af Amer: 69 mL/min/{1.73_m2} (ref >59)
GFR calc non Af Amer: 60 mL/min/{1.73_m2} (ref >59)
Globulin, Total: 2.5 g/dL (ref 1.5–4.5)
Glucose: 290 mg/dL — ABNORMAL HIGH (ref 65–99)
Potassium: 5.1 mmol/L (ref 3.5–5.2)
Sodium: 136 mmol/L (ref 134–144)
Total Protein: 7.1 g/dL (ref 6.0–8.5)

## 2018-07-11 LAB — LIPID PANEL WITH LDL/HDL RATIO
Cholesterol, Total: 250 mg/dL — ABNORMAL HIGH (ref 100–199)
HDL: 43 mg/dL (ref >39)
LDL Calculated: 144 mg/dL — ABNORMAL HIGH (ref 0–99)
LDl/HDL Ratio: 3.3 ratio (ref 0.0–3.6)
Triglycerides: 313 mg/dL — ABNORMAL HIGH (ref 0–149)
VLDL Cholesterol Cal: 63 mg/dL — ABNORMAL HIGH (ref 5–40)

## 2018-07-11 LAB — CK: Total CK: 134 U/L (ref 24–204)

## 2018-07-11 MED ORDER — ROSUVASTATIN CALCIUM 5 MG PO TABS: 5.0000 mg | ORAL_TABLET | Freq: Every day | ORAL | 11 refills | Status: DC

## 2018-07-11 NOTE — Progress Notes (Signed)
Cardiology Office Note:    Date:  07/11/2018   ID:  Casey Lynch, DOB 08-07-1946, MRN [id number]  PCP:  Casey Huddle, MD  Cardiologist:  Casey Dresser, MD PhD  Referring MD: Casey Huddle, MD   CC: new patient consult for shortness of breath  History of Present Illness:    Casey Lynch is a 72 y.o. male with a hx of COPD, former tobacco abuse, diabetes, carotid bruit, left subclavian steal syndrome who is seen as a new consult at the request of Casey Huddle, MD for the evaluation and management of shortness of breath.  Per notes received from Dr. Inda Lynch' office (dated 01/03/18), he was having shortness of breath at night.  Patient also brings copies of prior notes from [facility] and Vascular.He reports that he was on his way [place] when he stopped at [facility] for exertional dyspnea. He had a stress test that showed a small reversible anterior defect as well as a new reduced ejection fraction (EF in the 30s).    On 01/21/18, he had a right and left heart catheterization. RHC: RA 15 mmHg, RV 64/11, mean 15. PA 64/20 with mean of 40. Wedge 17. CO 4.85, CI 2.46.  LHC: RCA with diffuse 20-30% proximal disease and distal 50% with large PDA. Distal RCA stenosis was iFR'ed and not significant. LM normal. LAD was large, 30% proximal/mid stenosis. Moderate caliber D1 with ostial 50% stenosis. LCx small vessel without significant disease. RA moderate caliber with no significant disease.   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   Echo done at the same center on 04/19/18 noted estimated EF of 42%, with diffuse hypokinesis. Normal RV, collapsible IVC with RA pressure of 3 mmHg, trivial MR, trivial TR, trivial PR.   Today patient reports that at the time he presented to the hospital in 01/2018, he had PND to the point that he hadn't slept in 3 days. Wife is an Therapist, sports  and was chief nursing office in [place] in the past. Was 188 lbs on admission, 172 on discharge.  Was placed on entresto, titrated to peak dose around 04/2018. Takes aspirin 81 mg daily. Atorvastatin aggravated his muscle/joint pain, mildly improved with stopping. Doesn't think he was tried on any other medications.  Spironolactone was tried in the past. Was having severe GI upset, nausea. Stopped taking, symptoms resolved.  HF medications: -tolerating entresto max dose, no issues -spironolactone made him feel poorly in the past -metoprolol succinate 25 mg daily, tolerating -not on SGLT2i/DPP4 (on metformin/glimeperide)--has already talked to PharmD, I would prefer SGLT2i like jardiance -lasix: takes daily, has fallen off from taking daily weights.   CAD meds: -aspirin: on 81 mg, tolerating -statin: discussed pathophysiology, benefits, risk of statins extensively. He did not tolerate atorvastatin but willing to try another -takes aleve only sporadically; counseled on risk of CAD/NSAIDs  Activity: plays golf 1-2 times/ week, started pickleball at the [facility], working on increasing activity level. Former smoker, quit 2018.  Past Medical History:  Diagnosis Date  . Carotid bruit   . Diabetes mellitus without complication ([place])   . Eczema   . Heart failure ([place])   . History of colonic polyps   . Hypercholesterolemia   . Hyperlipidemia   . Macular degeneration   . Osteoarthritis   . Paresthesias   . Pseudogout   . Subclavian steal syndrome     Past Surgical History:  Procedure Laterality  Date  . CARDIAC CATHETERIZATION    . COLONOSCOPY WITH PROPOFOL N/A 10/24/2016   Procedure: COLONOSCOPY WITH PROPOFOL;  Surgeon: Garlan Fair, MD;  Location: [facility] ENDOSCOPY;  Service: Endoscopy;  Laterality: N/A;  . HYDROCELE EXCISION / REPAIR    . KNEE SURGERY    . REFRACTIVE SURGERY    . TONSILLECTOMY      Current Medications: Current Outpatient Medications on File Prior to Visit    Medication Sig  . aspirin 81 MG tablet Take 81 mg by mouth daily.  Marland Kitchen b complex vitamins capsule Take 1 capsule by mouth daily.  . colchicine (COLCRYS) 0.6 MG tablet Take 0.6 mg by mouth daily.  . Esomeprazole Magnesium (NEXIUM PO) Take by mouth.  . furosemide (LASIX) 20 MG tablet Take 20 mg by mouth.  Marland Kitchen glimepiride (AMARYL) 4 MG tablet Take 4 mg by mouth daily with breakfast.  . metFORMIN (GLUCOPHAGE) 1000 MG tablet Take 1,000 mg by mouth 2 (two) times daily.  . Multiple Vitamins-Minerals (ICAPS AREDS 2 PO) Take by mouth.  . naproxen sodium (ANAPROX) 220 MG tablet Take 220 mg by mouth 2 (two) times daily as needed (for pain.).  Marland Kitchen Omeprazole Magnesium (PRILOSEC OTC PO) Take by mouth.  . sacubitril-valsartan (ENTRESTO) 97-103 MG Take 1 tablet by mouth 2 (two) times daily.  Marland Kitchen triamcinolone cream (KENALOG) 0.1 % Apply 1 application topically 2 (two) times daily as needed (for dry skin.).  . Turmeric 500 MG CAPS Take 1 capsule by mouth daily.  . Vitamin D, Ergocalciferol, 2000 units CAPS Take by mouth daily.  Marland Kitchen atorvastatin (LIPITOR) 20 MG tablet Take 20 mg by mouth daily.   No current facility-administered medications on file prior to visit.      Allergies:   Statins and Tetanus toxoids   Social History   Socioeconomic History  . Marital status: Married    Spouse name: Not on file  . Number of children: Not on file  . Years of education: Not on file  . Highest education level: Not on file  Occupational History  . Not on file  Social Needs  . Financial resource strain: Not on file  . Food insecurity:    Worry: Not on file    Inability: Not on file  . Transportation needs:    Medical: Not on file    Non-medical: Not on file  Tobacco Use  . Smoking status: Former Smoker    Packs/day: 1.00    Years: 59.00    Pack years: 59.00    Types: Cigarettes    Last attempt to quit: 09/05/2016    Years since quitting: 1.8  . Smokeless tobacco: Never Used  Substance and Sexual Activity   . Alcohol use: Yes    Alcohol/week: 0.0 standard drinks    Comment: Rare  . Drug use: No  . Sexual activity: Yes  Lifestyle  . Physical activity:    Days per week: Not on file    Minutes per session: Not on file  . Stress: Not on file  Relationships  . Social connections:    Talks on phone: Not on file    Gets together: Not on file    Attends religious service: Not on file    Active member of club or organization: Not on file    Attends meetings of clubs or organizations: Not on file    Relationship status: Not on file  Other Topics Concern  . Not on file  Social History Narrative  . Not on  file     Family History: The patient's family history includes Bipolar disorder in his brother; Cancer in his paternal grandmother; Diabetes in his brother; Heart failure in his father.  ROS:   Please see the history of present illness.  Additional pertinent ROS:  Constitutional: Negative for chills, fever, night sweats, unintentional weight loss  HENT: Negative for ear pain and hearing loss.   Eyes: Negative for loss of vision and eye pain.  Respiratory: Negative for cough, sputum, shortness of breath, wheezing.   Cardiovascular: Negative for chest pain, palpitations, PND, orthopnea, lower extremity edema and claudication.  Gastrointestinal: Negative for abdominal pain, melena, and hematochezia.  Genitourinary: Negative for dysuria and hematuria.  Musculoskeletal: Negative for falls and myalgias.  Skin: Negative for itching and rash.  Neurological: Negative for focal weakness, focal sensory changes and loss of consciousness.  Endo/Heme/Allergies: Does not bruise/bleed easily.    EKGs/Labs/Other Studies Reviewed:    The following studies were reviewed today: Personally reviewed notes/workup/imaging from outside records  EKG:  EKG is personally reviewe.  The ekg ordered today demonstrates NSR, RBBB.  Recent Labs: 07/11/2018: ALT 40; BUN 20; Creatinine, Ser 1.20; Potassium 5.1;  Sodium 136  Recent Lipid Panel    Component Value Date/Time   CHOL 250 (H) 07/11/2018 0957   TRIG 313 (H) 07/11/2018 0957   HDL 43 07/11/2018 0957   LDLCALC 144 (H) 07/11/2018 0957    Physical Exam:    VS:  BP 130/70   Pulse 81   Ht 5' 6.5" (1.689 m)   Wt 183 lb 12.8 oz (83.4 kg)   BMI 29.22 kg/m     Wt Readings from Last 3 Encounters:  07/11/18 183 lb 12.8 oz (83.4 kg)  06/14/18 180 lb 3.2 oz (81.7 kg)  08/23/17 182 lb (82.6 kg)     GEN: Well nourished, well developed in no acute distress HEENT: Normal NECK: No JVD; No carotid bruits LYMPHATICS: No lymphadenopathy CARDIAC: regular rhythm, normal S1 and S2, no murmurs, rubs, gallops. Radial and DP pulses 2+ bilaterally. RESPIRATORY:  Clear to auscultation without rales, wheezing or rhonchi  ABDOMEN: Soft, non-tender, non-distended MUSCULOSKELETAL:  No edema; No deformity  SKIN: Warm and dry NEUROLOGIC:  Alert and oriented x 3 PSYCHIATRIC:  Normal affect   ASSESSMENT:    1. Chronic combined systolic and diastolic heart failure ([place])   2. Coronary artery disease due to lipid rich plaque   3. Mixed hyperlipidemia   4. Statin intolerance   5. Medication management   6. Counseling on health promotion and disease prevention   7. Encounter for education about heart failure   8. Type 2 diabetes mellitus with other specified complication, without long-term current use of insulin (HCC)    PLAN:    1. Chronic combined systolic and diastolic heart failure: while no major obstructive CAD, does have predominantly RCA disease but other disease as well. Likely NICM component. -on max dose entresto and tolerating -on metoprolol succinate 25 mg daily and tolerating -on furosemide 20 mg daily. Counseled on daily weights -did not tolerate spironolactone per report -if doing well, recheck echo in 2 mos to follow EF for improvement -NYHA class I-II now -ECG with RBBB  2. Heart failure education: Do the following things EVERY  DAY:  1) Weigh yourself EVERY morning after you go to the bathroom but before you eat or drink anything. Write this number down in a weight log/diary. If you gain 3 pounds overnight or 5 pounds in a week, call the  office.  2) Take your medicines as prescribed. If you have concerns about your medications, please call us before you stop taking them.   3) Eat low salt foods-Limit salt (sodium) to 2000 mg per day. This will help prevent your body from holding onto fluid. Read food labels as many processed foods have a lot of sodium, especially canned goods and prepackaged meats. If you would like some assistance choosing low sodium foods, we would be happy to set you up with a nutritionist.  4) Stay as active as you can everyday. Staying active will give you more energy and make your muscles stronger. Start with 5 minutes at a time and work your way up to 30 minutes a day. Break up your activities--do some in the morning and some in the afternoon. Start with 3 days per week and work your way up to 5 days as you can.  If you have chest pain, feel short of breath, dizzy, or lightheaded, STOP. If you don't feel better after a short rest, call 911. If you do feel better, call the office to let us know you have symptoms with exercise.  5) Limit all fluids for the day to less than 2 liters. Fluid includes all drinks, coffee, juice, ice chips, soup, jello, and all other liquids.  3. Coronary artery disease (predominantly RCA, but diffuse mild disease). Hyperlipidemia with prior intolerance to atorvastatin.  -discussed pathophysiology of lipid deposition/plaque formation, how statins work, how Mis occur from unstable plaques. Discussed metabolism of statins. Discussed how we manage statin intolerance. Discussed alternatives to statins, including omega-3 derived medications, ezetimibe, and PCKS9i.   -he is amenable to trialing another statin. Will start low dose rosuvastatin. If tolerated, will uptitrate slowly to  goal dose  -LDL goal <70. If cannot achieve, will need to establish with lipid clinic to discuss PCSK9  -given prior statin intolerance, will check CMP and CK prior to initiation to have a baseline  -baseline lipids today off statins  4. Type II diabetes: would prefer to be on SGLT2i. He has appt already scheduled with PharmD to discuss this.  5. Secondary prevention -recommend heart healthy/Mediterranean diet, with whole grains, fruits, vegetable, fish, lean meats, nuts, and olive oil. Limit salt. -recommend moderate walking, 3-5 times/week for 30-50 minutes each session. Aim for at least 150 minutes.week. Goal should be pace of 3 miles/hours, or walking 1.5 miles in 30 minutes -recommend avoidance of tobacco products. Avoid excess alcohol. -Additional risk factor control:  -Diabetes: as above  -Lipids: as above  -Blood pressure control: Goal <130/80, at goal today  -Weight: BMI 29.2. Counseled on lifestyle -ASCVD risk score: not technically accurate given this is secondary prevention, but demonstrates high risk The 10-year ASCVD risk score Mikey Bussing DC Jr., et al., 2013) is: 43.6%   Values used to calculate the score:     Age: 41 years     Sex: Male     Is Non-Hispanic African American: No     Diabetic: Yes     Tobacco smoker: No     Systolic Blood Pressure: 144 mmHg     Is BP treated: No     HDL Cholesterol: 43 mg/dL     Total Cholesterol: 250 mg/dL   Plan for follow up: 2 mos  TIME SPENT WITH PATIENT: 60 minutes of direct patient care. More than 50% of that time was spent on coordination of care and counseling regarding heart failure, secondary prevention, pathophysiology of lipids, mechanism of statins, risk/benefit of statins  and other lipid medications.  Casey Dresser, MD, PhD [place]  [facility]   Medication Adjustments/Labs and Tests Ordered: Current medicines are reviewed at length with the patient today.  Concerns regarding medicines are outlined above.   Orders Placed This Encounter  Procedures  . Comprehensive Metabolic Panel (CMET)  . CK (Creatine Kinase)  . Lipid Panel With LDL/HDL Ratio  . Hemoglobin A1c  . EKG 12-Lead   Meds ordered this encounter  Medications  . rosuvastatin (CRESTOR) 5 MG tablet    Sig: Take 1 tablet (5 mg total) by mouth daily.    Dispense:  30 tablet    Refill:  11    Patient Instructions  Medication Instructions:  Start: Crestor 5 mg  If you need a refill on your cardiac medications before your next appointment, please call your pharmacy.   Lab work: Your physician recommends that you return for lab work today (Lipid, Ck, CMP)  If you have labs (blood work) drawn today and your tests are completely normal, you will receive your results only by: Marland Kitchen MyChart Message (if you have MyChart) OR . A paper copy in the mail If you have any lab test that is abnormal or we need to change your treatment, we will call you to review the results.  Testing/Procedures: None  Follow-Up: At [facility], you and your health needs are our priority.  As part of our continuing mission to provide you with exceptional heart care, we have created designated Provider Care Teams.  These Care Teams include your primary Cardiologist (physician) and Advanced Practice Providers (APPs -  Physician Assistants and Nurse Practitioners) who all work together to provide you with the care you need, when you need it. You will need a follow up appointment in 2 months.  Please call our office 2 months in advance to schedule this appointment.  You may see Dr. Harrell Gave or one of the following Advanced Practice Providers on your designated Care Team:   Rosaria Ferries, PA-C . Jory Sims, DNP, ANP  Any Other Special Instructions Will Be Listed Below (If Applicable).       Signed, Casey Dresser, MD PhD 07/11/2018 9:14 AM    [place]

## 2018-07-11 NOTE — Patient Instructions (Signed)
Medication Instructions:  Start: Crestor 5 mg  If you need a refill on your cardiac medications before your next appointment, please call your pharmacy.   Lab work: Your physician recommends that you return for lab work today (Lipid, Ck, CMP)  If you have labs (blood work) drawn today and your tests are completely normal, you will receive your results only by: Marland Kitchen MyChart Message (if you have MyChart) OR . A paper copy in the mail If you have any lab test that is abnormal or we need to change your treatment, we will call you to review the results.  Testing/Procedures: None  Follow-Up: At [facility], you and your health needs are our priority.  As part of our continuing mission to provide you with exceptional heart care, we have created designated Provider Care Teams.  These Care Teams include your primary Cardiologist (physician) and Advanced Practice Providers (APPs -  Physician Assistants and Nurse Practitioners) who all work together to provide you with the care you need, when you need it. You will need a follow up appointment in 2 months.  Please call our office 2 months in advance to schedule this appointment.  You may see Dr. Harrell Gave or one of the following Advanced Practice Providers on your designated Care Team:   Rosaria Ferries, PA-C . Jory Sims, DNP, ANP  Any Other Special Instructions Will Be Listed Below (If Applicable).

## 2018-07-12 ENCOUNTER — Encounter: Payer: Self-pay | Admitting: Cardiology

## 2018-07-12 DIAGNOSIS — E663 Overweight: Secondary | ICD-10-CM | POA: Diagnosis not present

## 2018-07-12 DIAGNOSIS — Z79899 Other long term (current) drug therapy: Secondary | ICD-10-CM | POA: Diagnosis not present

## 2018-07-12 DIAGNOSIS — I2510 Atherosclerotic heart disease of native coronary artery without angina pectoris: Secondary | ICD-10-CM | POA: Insufficient documentation

## 2018-07-12 DIAGNOSIS — M150 Primary generalized (osteo)arthritis: Secondary | ICD-10-CM | POA: Diagnosis not present

## 2018-07-12 DIAGNOSIS — I5042 Chronic combined systolic (congestive) and diastolic (congestive) heart failure: Secondary | ICD-10-CM | POA: Insufficient documentation

## 2018-07-12 DIAGNOSIS — E119 Type 2 diabetes mellitus without complications: Secondary | ICD-10-CM | POA: Insufficient documentation

## 2018-07-12 DIAGNOSIS — Z789 Other specified health status: Secondary | ICD-10-CM | POA: Insufficient documentation

## 2018-07-12 DIAGNOSIS — Z6828 Body mass index (BMI) 28.0-28.9, adult: Secondary | ICD-10-CM | POA: Diagnosis not present

## 2018-07-12 DIAGNOSIS — M2550 Pain in unspecified joint: Secondary | ICD-10-CM | POA: Diagnosis not present

## 2018-07-12 DIAGNOSIS — M1120 Other chondrocalcinosis, unspecified site: Secondary | ICD-10-CM | POA: Diagnosis not present

## 2018-07-12 DIAGNOSIS — I2583 Coronary atherosclerosis due to lipid rich plaque: Secondary | ICD-10-CM

## 2018-07-12 DIAGNOSIS — E782 Mixed hyperlipidemia: Secondary | ICD-10-CM | POA: Insufficient documentation

## 2018-07-12 LAB — HEMOGLOBIN A1C
Est. average glucose Bld gHb Est-mCnc: 200 mg/dL
Hgb A1c MFr Bld: 8.6 % — ABNORMAL HIGH (ref 4.8–5.6)

## 2018-07-13 ENCOUNTER — Ambulatory Visit (INDEPENDENT_AMBULATORY_CARE_PROVIDER_SITE_OTHER): Payer: Medicare Other | Admitting: Podiatry

## 2018-07-13 ENCOUNTER — Ambulatory Visit (INDEPENDENT_AMBULATORY_CARE_PROVIDER_SITE_OTHER): Payer: Medicare Other

## 2018-07-13 ENCOUNTER — Encounter: Payer: Self-pay | Admitting: Podiatry

## 2018-07-13 ENCOUNTER — Other Ambulatory Visit: Payer: Self-pay | Admitting: Podiatry

## 2018-07-13 VITALS — BP 130/77 | HR 79

## 2018-07-13 DIAGNOSIS — I2583 Coronary atherosclerosis due to lipid rich plaque: Secondary | ICD-10-CM | POA: Diagnosis not present

## 2018-07-13 DIAGNOSIS — M79672 Pain in left foot: Secondary | ICD-10-CM

## 2018-07-13 DIAGNOSIS — M7672 Peroneal tendinitis, left leg: Secondary | ICD-10-CM | POA: Diagnosis not present

## 2018-07-13 DIAGNOSIS — I2510 Atherosclerotic heart disease of native coronary artery without angina pectoris: Secondary | ICD-10-CM | POA: Diagnosis not present

## 2018-07-13 MED ORDER — TRIAMCINOLONE ACETONIDE 10 MG/ML IJ SUSP
10.0000 mg | Freq: Once | INTRAMUSCULAR | Status: AC
  Administered 2018-07-13: 10 mg

## 2018-07-15 NOTE — Progress Notes (Signed)
Subjective:   Patient ID: Casey Lynch, male   DOB: 72 y.o.   MRN: [id number]   HPI Patient presents stating he is developed a lot of pain in the outside of the left foot that usually goes away but is been really sore after he stepped on a root and he is having trouble bearing weight on his foot.  Does not remember any other pathology and does not smoke and likes to be active   Review of Systems  All other systems reviewed and are negative.       Objective:  Physical Exam  Constitutional: He appears well-developed and well-nourished.  Cardiovascular: Intact distal pulses.  Pulmonary/Chest: Effort normal.  Musculoskeletal: Normal range of motion.  Neurological: He is alert.  Skin: Skin is warm.  Nursing note and vitals reviewed.   Neurovascular status intact muscle strength is adequate range of motion within normal limits with patient found to have exquisite discomfort base of fifth metatarsal left with fluid buildup around the area and no indications of tendon dysfunction or tendon tear.  Patient has trouble walking on this part of his foot and is limping and has good digital perfusion     Assessment:  Acute tendinitis peroneal at the base of fifth metatarsal left with no indication currently of tendon tear     Plan:  H&P condition reviewed and careful sheath injection administered left 3 mg Kenalog 5 mg Xylocaine and advised on physical therapy and went ahead today and applied fascial brace to lift up the lateral side of the foot along with aggressive ice therapy.  Patient will be seen back for [place] to recheck again in the next few weeks  X-ray was negative for signs of fracture did really indicate reactivity around base of fifth metatarsal with moderate arthritis and no other pathology signed visit

## 2018-07-16 ENCOUNTER — Ambulatory Visit: Payer: Medicare Other | Admitting: Podiatry

## 2018-07-25 DIAGNOSIS — E559 Vitamin D deficiency, unspecified: Secondary | ICD-10-CM | POA: Diagnosis not present

## 2018-07-25 DIAGNOSIS — Z0000 Encounter for general adult medical examination without abnormal findings: Secondary | ICD-10-CM | POA: Diagnosis not present

## 2018-07-25 DIAGNOSIS — E782 Mixed hyperlipidemia: Secondary | ICD-10-CM | POA: Diagnosis not present

## 2018-07-25 DIAGNOSIS — Z23 Encounter for immunization: Secondary | ICD-10-CM | POA: Diagnosis not present

## 2018-07-25 DIAGNOSIS — M1990 Unspecified osteoarthritis, unspecified site: Secondary | ICD-10-CM | POA: Diagnosis not present

## 2018-07-25 DIAGNOSIS — I255 Ischemic cardiomyopathy: Secondary | ICD-10-CM | POA: Diagnosis not present

## 2018-07-25 DIAGNOSIS — J449 Chronic obstructive pulmonary disease, unspecified: Secondary | ICD-10-CM | POA: Diagnosis not present

## 2018-07-25 DIAGNOSIS — Z1389 Encounter for screening for other disorder: Secondary | ICD-10-CM | POA: Diagnosis not present

## 2018-07-25 DIAGNOSIS — E1165 Type 2 diabetes mellitus with hyperglycemia: Secondary | ICD-10-CM | POA: Diagnosis not present

## 2018-07-25 DIAGNOSIS — N5201 Erectile dysfunction due to arterial insufficiency: Secondary | ICD-10-CM | POA: Diagnosis not present

## 2018-07-25 DIAGNOSIS — Z79899 Other long term (current) drug therapy: Secondary | ICD-10-CM | POA: Diagnosis not present

## 2018-07-25 DIAGNOSIS — Z86010 Personal history of colonic polyps: Secondary | ICD-10-CM | POA: Diagnosis not present

## 2018-07-25 DIAGNOSIS — R7309 Other abnormal glucose: Secondary | ICD-10-CM | POA: Diagnosis not present

## 2018-07-25 DIAGNOSIS — K219 Gastro-esophageal reflux disease without esophagitis: Secondary | ICD-10-CM | POA: Diagnosis not present

## 2018-07-25 DIAGNOSIS — E7800 Pure hypercholesterolemia, unspecified: Secondary | ICD-10-CM | POA: Diagnosis not present

## 2018-07-25 DIAGNOSIS — E1140 Type 2 diabetes mellitus with diabetic neuropathy, unspecified: Secondary | ICD-10-CM | POA: Diagnosis not present

## 2018-07-25 DIAGNOSIS — I270 Primary pulmonary hypertension: Secondary | ICD-10-CM | POA: Diagnosis not present

## 2018-07-27 ENCOUNTER — Ambulatory Visit (INDEPENDENT_AMBULATORY_CARE_PROVIDER_SITE_OTHER): Payer: Medicare Other | Admitting: Podiatry

## 2018-07-27 ENCOUNTER — Encounter: Payer: Self-pay | Admitting: Podiatry

## 2018-07-27 DIAGNOSIS — M7672 Peroneal tendinitis, left leg: Secondary | ICD-10-CM | POA: Diagnosis not present

## 2018-07-27 DIAGNOSIS — I2583 Coronary atherosclerosis due to lipid rich plaque: Secondary | ICD-10-CM

## 2018-07-27 DIAGNOSIS — I2510 Atherosclerotic heart disease of native coronary artery without angina pectoris: Secondary | ICD-10-CM

## 2018-07-27 NOTE — Progress Notes (Signed)
Subjective:   Patient ID: Casey Lynch, male   DOB: 72 y.o.   MRN: [id number]   HPI Patient presents stating the onset of her left foot feels a lot better not having minimal discomfort   ROS      Objective:  Physical Exam  Neurovascular status intact with significant diminishment of discomfort outside left foot with inflammation still present but a lot better     Assessment:       Plan:

## 2018-07-29 NOTE — Progress Notes (Signed)
Subjective:   Patient ID: Casey Lynch, male   DOB: 72 y.o.   MRN: [id number]   HPI Patient states my foot is feeling a lot better    ROS      Objective:  Physical Exam  Neurovascular status intact with patient's foot improving with pain still present upon deep palpation but significantly improved from previous     Assessment:  Peroneal tendinitis improving     Plan:  Recommended utilization of ice therapy anti-inflammatories physical therapy and patient's discharge will be seen back as needed

## 2018-08-04 DIAGNOSIS — J449 Chronic obstructive pulmonary disease, unspecified: Secondary | ICD-10-CM | POA: Diagnosis not present

## 2018-08-04 DIAGNOSIS — G458 Other transient cerebral ischemic attacks and related syndromes: Secondary | ICD-10-CM | POA: Diagnosis not present

## 2018-08-04 DIAGNOSIS — E1342 Other specified diabetes mellitus with diabetic polyneuropathy: Secondary | ICD-10-CM | POA: Diagnosis not present

## 2018-08-04 DIAGNOSIS — I509 Heart failure, unspecified: Secondary | ICD-10-CM | POA: Diagnosis not present

## 2018-08-04 DIAGNOSIS — M1990 Unspecified osteoarthritis, unspecified site: Secondary | ICD-10-CM | POA: Diagnosis not present

## 2018-08-04 DIAGNOSIS — E1140 Type 2 diabetes mellitus with diabetic neuropathy, unspecified: Secondary | ICD-10-CM | POA: Diagnosis not present

## 2018-08-04 DIAGNOSIS — E782 Mixed hyperlipidemia: Secondary | ICD-10-CM | POA: Diagnosis not present

## 2018-08-04 DIAGNOSIS — M179 Osteoarthritis of knee, unspecified: Secondary | ICD-10-CM | POA: Diagnosis not present

## 2018-08-04 DIAGNOSIS — I255 Ischemic cardiomyopathy: Secondary | ICD-10-CM | POA: Diagnosis not present

## 2018-08-04 DIAGNOSIS — E1165 Type 2 diabetes mellitus with hyperglycemia: Secondary | ICD-10-CM | POA: Diagnosis not present

## 2018-08-08 ENCOUNTER — Ambulatory Visit: Payer: Medicare Other | Admitting: Pulmonary Disease

## 2018-08-10 DIAGNOSIS — I255 Ischemic cardiomyopathy: Secondary | ICD-10-CM | POA: Diagnosis not present

## 2018-08-10 DIAGNOSIS — E1342 Other specified diabetes mellitus with diabetic polyneuropathy: Secondary | ICD-10-CM | POA: Diagnosis not present

## 2018-08-10 DIAGNOSIS — M179 Osteoarthritis of knee, unspecified: Secondary | ICD-10-CM | POA: Diagnosis not present

## 2018-08-10 DIAGNOSIS — J449 Chronic obstructive pulmonary disease, unspecified: Secondary | ICD-10-CM | POA: Diagnosis not present

## 2018-08-10 DIAGNOSIS — G458 Other transient cerebral ischemic attacks and related syndromes: Secondary | ICD-10-CM | POA: Diagnosis not present

## 2018-08-10 DIAGNOSIS — M1990 Unspecified osteoarthritis, unspecified site: Secondary | ICD-10-CM | POA: Diagnosis not present

## 2018-08-10 DIAGNOSIS — E782 Mixed hyperlipidemia: Secondary | ICD-10-CM | POA: Diagnosis not present

## 2018-08-10 DIAGNOSIS — E1165 Type 2 diabetes mellitus with hyperglycemia: Secondary | ICD-10-CM | POA: Diagnosis not present

## 2018-08-10 DIAGNOSIS — E1140 Type 2 diabetes mellitus with diabetic neuropathy, unspecified: Secondary | ICD-10-CM | POA: Diagnosis not present

## 2018-08-10 DIAGNOSIS — I509 Heart failure, unspecified: Secondary | ICD-10-CM | POA: Diagnosis not present

## 2018-08-27 DIAGNOSIS — K219 Gastro-esophageal reflux disease without esophagitis: Secondary | ICD-10-CM | POA: Diagnosis not present

## 2018-08-27 DIAGNOSIS — E1165 Type 2 diabetes mellitus with hyperglycemia: Secondary | ICD-10-CM | POA: Diagnosis not present

## 2018-09-11 DIAGNOSIS — E1165 Type 2 diabetes mellitus with hyperglycemia: Secondary | ICD-10-CM | POA: Diagnosis not present

## 2018-09-11 DIAGNOSIS — I255 Ischemic cardiomyopathy: Secondary | ICD-10-CM | POA: Diagnosis not present

## 2018-09-11 DIAGNOSIS — I509 Heart failure, unspecified: Secondary | ICD-10-CM | POA: Diagnosis not present

## 2018-09-11 DIAGNOSIS — K219 Gastro-esophageal reflux disease without esophagitis: Secondary | ICD-10-CM | POA: Diagnosis not present

## 2018-09-11 DIAGNOSIS — E1140 Type 2 diabetes mellitus with diabetic neuropathy, unspecified: Secondary | ICD-10-CM | POA: Diagnosis not present

## 2018-09-11 DIAGNOSIS — E782 Mixed hyperlipidemia: Secondary | ICD-10-CM | POA: Diagnosis not present

## 2018-09-12 ENCOUNTER — Telehealth: Payer: Self-pay

## 2018-09-12 ENCOUNTER — Encounter: Payer: Self-pay | Admitting: Cardiology

## 2018-09-12 ENCOUNTER — Ambulatory Visit (INDEPENDENT_AMBULATORY_CARE_PROVIDER_SITE_OTHER): Payer: Medicare Other | Admitting: Cardiology

## 2018-09-12 VITALS — BP 126/74 | HR 75 | Ht 66.5 in | Wt 179.2 lb

## 2018-09-12 DIAGNOSIS — I5042 Chronic combined systolic (congestive) and diastolic (congestive) heart failure: Secondary | ICD-10-CM | POA: Diagnosis not present

## 2018-09-12 DIAGNOSIS — I2583 Coronary atherosclerosis due to lipid rich plaque: Secondary | ICD-10-CM | POA: Diagnosis not present

## 2018-09-12 DIAGNOSIS — I2510 Atherosclerotic heart disease of native coronary artery without angina pectoris: Secondary | ICD-10-CM

## 2018-09-12 DIAGNOSIS — Z794 Long term (current) use of insulin: Secondary | ICD-10-CM

## 2018-09-12 DIAGNOSIS — Z79899 Other long term (current) drug therapy: Secondary | ICD-10-CM

## 2018-09-12 DIAGNOSIS — E782 Mixed hyperlipidemia: Secondary | ICD-10-CM

## 2018-09-12 DIAGNOSIS — E1169 Type 2 diabetes mellitus with other specified complication: Secondary | ICD-10-CM

## 2018-09-12 DIAGNOSIS — Z7189 Other specified counseling: Secondary | ICD-10-CM

## 2018-09-12 MED ORDER — ICOSAPENT ETHYL 1 G PO CAPS: 2.0000 g | ORAL_CAPSULE | Freq: Two times a day (BID) | ORAL | 3 refills | Status: DC

## 2018-09-12 NOTE — Patient Instructions (Signed)
Medication Instructions:  Start: Vascepa 2 g two times a day  If you need a refill on your cardiac medications before your next appointment, please call your pharmacy.   Lab work: None  Testing/Procedures: None  Follow-Up: At [facility], you and your health needs are our priority.  As part of our continuing mission to provide you with exceptional heart care, we have created designated Provider Care Teams.  These Care Teams include your primary Cardiologist (physician) and Advanced Practice Providers (APPs -  Physician Assistants and Nurse Practitioners) who all work together to provide you with the care you need, when you need it. You will need a follow up appointment in 3 months.  Please call our office 2 months in advance to schedule this appointment.  You may see Buford Dresser, MD or one of the following Advanced Practice Providers on your designated Care Team:   Rosaria Ferries, PA-C . Jory Sims, DNP, ANP

## 2018-09-12 NOTE — Telephone Encounter (Signed)
Recent lab results from 07/11/18 faxed to [facility] [phone] per pt's request.

## 2018-09-12 NOTE — Progress Notes (Signed)
Cardiology Office Note:    Date:  09/12/2018   ID:  Casey Lynch, DOB [birth year], MRN [id number]  PCP:  Josetta Huddle, MD  Cardiologist:  Buford Dresser, MD PhD  Referring MD: Josetta Huddle, MD   CC: follow up for heart failure  History of Present Illness:    Casey Lynch is a 73 y.o. male with a hx of COPD, former tobacco abuse, diabetes, carotid bruit, left subclavian steal syndrome who is seen in follow up for heart failure management. My initial consult with him was on 07/11/18.  Cardiac history: Per [facility]. He reports that he was on his way [place] in 01/2018 when he stopped at [facility] for exertional dyspnea. He had a stress test that showed a small reversible anterior defect as well as a new reduced ejection fraction (EF in the 30s).    On 01/21/18, he had a right and left heart catheterization. RHC: RA 15 mmHg, RV 64/11, mean 15. PA 64/20 with mean of 40. Wedge 17. CO 4.85, CI 2.46.  LHC: RCA with diffuse 20-30% proximal disease and distal 50% with large PDA. Distal RCA stenosis was iFR'ed and not significant. LM normal. LAD was large, 30% proximal/mid stenosis. Moderate caliber D1 with ostial 50% stenosis. LCx small vessel without significant disease. RA moderate caliber with no significant disease.   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  Echo done at the same center on 04/19/18 noted estimated EF of 42%, with diffuse hypokinesis. Normal RV, collapsible IVC with RA pressure of 3 mmHg, trivial MR, trivial TR, trivial PR.   Concerns today: mild shortness of breath when bending over, but none with standing, lying down, being active. Started insulin glargine this AM. Also having burning upper chest/neck pain in the morning, starts in the esophagus and goes to both arms. On zantac and Tums at night, just started, will see if it  helps. Was not helped by PPI.   Playing golf, gets winded walking up hill but does well overall without limitations. Not taking lasix routinely, weights have been stable within 2 lbs.   HF medications: -tolerating entresto max dose, no issues -spironolactone made him feel poorly in the past,not on. -metoprolol succinate 25 mg daily, tolerating -was on jardiance, did not help with his diabetes and made him urinate all the time. Stopped now that he is on insulin -lasix: no longer takes daily. No LE edema. Breathing stable. Weight stable.   CAD meds: -aspirin: on 81 mg, tolerating -statin: tolerating rosuvastatin, did not tolerate atorvastatin in the past.  -takes aleve only sporadically; counseled on risk of CAD/NSAIDs. Has taken only once since our last visit.  -has history of high triglycerides, which have responded to fish oil in the past. Reviewed vascepa and recent data. He is willing to try.    Past Medical History:  Diagnosis Date  . Carotid bruit   . Diabetes mellitus without complication ([place])   . Eczema   . Heart failure ([place])   . History of colonic polyps   . Hypercholesterolemia   . Hyperlipidemia   . Macular degeneration   . Osteoarthritis   . Paresthesias   . Pseudogout   . Subclavian steal syndrome     Past Surgical History:  Procedure Laterality Date  . CARDIAC CATHETERIZATION    . COLONOSCOPY WITH PROPOFOL N/A 10/24/2016   Procedure: COLONOSCOPY WITH PROPOFOL;  Surgeon: Garlan Fair,  MD;  Location: [facility] ENDOSCOPY;  Service: Endoscopy;  Laterality: N/A;  . HYDROCELE EXCISION / REPAIR    . KNEE SURGERY    . REFRACTIVE SURGERY    . TONSILLECTOMY      Current Medications: Current Outpatient Medications on File Prior to Visit  Medication Sig  . aspirin 81 MG tablet Take 81 mg by mouth daily.  Marland Kitchen b complex vitamins capsule Take 1 capsule by mouth daily.  . colchicine (COLCRYS) 0.6 MG tablet Take 0.6 mg by mouth daily.  . Esomeprazole Magnesium (NEXIUM PO)  Take by mouth.  Marland Kitchen glimepiride (AMARYL) 4 MG tablet Take 4 mg by mouth daily with breakfast.  . Insulin Glargine, 2 Unit Dial, (TOUJEO MAX SOLOSTAR) 300 UNIT/ML SOPN Inject 15 Units into the skin. Inject 15 units subcutaneous daily  . metFORMIN (GLUCOPHAGE) 500 MG tablet Take 500 mg by mouth 2 (two) times daily.   . metoprolol succinate (TOPROL-XL) 25 MG 24 hr tablet Take 25 mg by mouth daily.  . rosuvastatin (CRESTOR) 5 MG tablet Take 1 tablet (5 mg total) by mouth daily.  . sacubitril-valsartan (ENTRESTO) 97-103 MG Take 1 tablet by mouth 2 (two) times daily.  Marland Kitchen triamcinolone cream (KENALOG) 0.1 % Apply 1 application topically 2 (two) times daily as needed (for dry skin.).  . Turmeric 500 MG CAPS Take 1 capsule by mouth daily.  . Vitamin D, Ergocalciferol, 2000 units CAPS Take by mouth daily.  . naproxen sodium (ANAPROX) 220 MG tablet Take 220 mg by mouth 2 (two) times daily as needed (for pain.).   No current facility-administered medications on file prior to visit.      Allergies:   Statins and Tetanus toxoids   Social History   Socioeconomic History  . Marital status: Married    Spouse name: Not on file  . Number of children: Not on file  . Years of education: Not on file  . Highest education level: Not on file  Occupational History  . Not on file  Social Needs  . Financial resource strain: Not on file  . Food insecurity:    Worry: Not on file    Inability: Not on file  . Transportation needs:    Medical: Not on file    Non-medical: Not on file  Tobacco Use  . Smoking status: Former Smoker    Packs/day: 1.00    Years: 59.00    Pack years: 59.00    Types: Cigarettes    Last attempt to quit: 09/05/2016    Years since quitting: 2.0  . Smokeless tobacco: Never Used  Substance and Sexual Activity  . Alcohol use: Yes    Alcohol/week: 0.0 standard drinks    Comment: Rare  . Drug use: No  . Sexual activity: Yes  Lifestyle  . Physical activity:    Days per week: Not on  file    Minutes per session: Not on file  . Stress: Not on file  Relationships  . Social connections:    Talks on phone: Not on file    Gets together: Not on file    Attends religious service: Not on file    Active member of club or organization: Not on file    Attends meetings of clubs or organizations: Not on file    Relationship status: Not on file  Other Topics Concern  . Not on file  Social History Narrative  . Not on file     Family History: The patient's family history includes Bipolar disorder in  his brother; Cancer in his paternal grandmother; Diabetes in his brother; Heart failure in his father.  ROS:   Please see the history of present illness.  Additional pertinent ROS:  Constitutional: Negative for chills, fever, night sweats, unintentional weight loss  HENT: Negative for ear pain and hearing loss.   Eyes: Negative for loss of vision and eye pain.  Respiratory: Negative for cough, sputum, shortness of breath at rest or on exertion, wheezing.   Cardiovascular: Negative for chest pain, palpitations, PND, orthopnea, lower extremity edema and claudication.  Gastrointestinal: Negative for abdominal pain, melena, and hematochezia.  Genitourinary: Negative for dysuria and hematuria.  Musculoskeletal: Negative for falls and myalgias.  Skin: Negative for itching and rash.  Neurological: Negative for focal weakness, focal sensory changes and loss of consciousness.  Endo/Heme/Allergies: Does not bruise/bleed easily.    EKGs/Labs/Other Studies Reviewed:    The following studies were reviewed today: Prior labs/studies reviewed from [facility]  EKG:  EKG is personally reviewed.  The ekg ordered previously demonstrates NSR, RBBB.  Recent Labs: 07/11/2018: ALT 40; BUN 20; Creatinine, Ser 1.20; Potassium 5.1; Sodium 136  Recent Lipid Panel    Component Value Date/Time   CHOL 250 (H) 07/11/2018 0957   TRIG 313 (H) 07/11/2018 0957   HDL 43 07/11/2018 0957   LDLCALC 144 (H)  07/11/2018 0957    Physical Exam:    VS:  BP 126/74   Pulse 75   Ht 5' 6.5" (1.689 m)   Wt 179 lb 3.2 oz (81.3 kg)   SpO2 95%   BMI 28.49 kg/m     Wt Readings from Last 3 Encounters:  09/12/18 179 lb 3.2 oz (81.3 kg)  07/11/18 183 lb 12.8 oz (83.4 kg)  06/14/18 180 lb 3.2 oz (81.7 kg)     GEN: Well nourished, well developed in no acute distress HEENT: Normal NECK: No JVD; No carotid bruits LYMPHATICS: No lymphadenopathy CARDIAC: regular rhythm, normal S1 and S2, no murmurs, rubs, gallops. Radial and DP pulses 2+ bilaterally. RESPIRATORY:  Clear to auscultation without rales, wheezing or rhonchi  ABDOMEN: Soft, non-tender, non-distended MUSCULOSKELETAL:  No edema; No deformity  SKIN: Warm and dry NEUROLOGIC:  Alert and oriented x 3 PSYCHIATRIC:  Normal affect   ASSESSMENT:    1. Chronic combined systolic and diastolic heart failure ([place])   2. Coronary artery disease due to lipid rich plaque   3. Type 2 diabetes mellitus with other specified complication, with long-term current use of insulin ([place])   4. Mixed hyperlipidemia   5. Medication management   6. Encounter for education about heart failure   7. Counseling on health promotion and disease prevention    PLAN:    1. Chronic combined systolic and diastolic heart failure: while no major obstructive CAD, does have predominantly RCA disease but other disease as well. Likely NICM. -on max dose entresto and tolerating -on metoprolol succinate 25 mg daily and tolerating -on furosemide 20 mg PRN, weight based dosing.  -did not tolerate spironolactone per report -no change in clinical status, will defer repeat echo unless clinical status changes.  -NYHA class I-II now -ECG with RBBB  2. Heart failure education: reviewed, see prior note for instructions. I suspect his shortness of breath is due to abdominal adiposity as he is euvolemic on exam.   3. Coronary artery disease (predominantly RCA, but diffuse mild disease).  Hyperlipidemia with prior intolerance to atorvastatin.  -started rosuvastatin, tolerating well. Repeat lipids, LFTs, BMP, Mag, BNP at next visit  -LDL  goal <70.  -has a history of high triglycerides, was >300 fasting at last check. Discussed vascepa at length today. Gave sample, will see if he tolerates. If cost is prohibitive, would prioritize other medications first.   4. Type II diabetes: trialed on SGLT2 inhibitor, did not control sugars and he urinated constantly. Just started insulin this AM.  5. Secondary prevention -recommend heart healthy/Mediterranean diet, with whole grains, fruits, vegetable, fish, lean meats, nuts, and olive oil. Limit salt. -recommend moderate walking, 3-5 times/week for 30-50 minutes each session. Aim for at least 150 minutes.week. Goal should be pace of 3 miles/hours, or walking 1.5 miles in 30 minutes -recommend avoidance of tobacco products. Avoid excess alcohol. -Additional risk factor control:  -Diabetes: as above  -Lipids: as above  -Blood pressure control: Goal <130/80, at goal today  -Weight: BMI 28. Counseled on lifestyle  Plan for follow up: 3 mos with labs, then will see in November when he returns back from [place].  TIME SPENT WITH PATIENT: 40 minutes of direct patient care. More than 50% of that time was spent on coordination of care and counseling regarding heart failure, secondary prevention,discussion re: indications and evidence for his cardiac medications.  Buford Dresser, MD, PhD [place]  [facility]   Medication Adjustments/Labs and Tests Ordered: Current medicines are reviewed at length with the patient today.  Concerns regarding medicines are outlined above.  No orders of the defined types were placed in this encounter.  Meds ordered this encounter  Medications  . DISCONTD: Icosapent Ethyl (VASCEPA) 1 g CAPS    Sig: Take 2 capsules (2 g total) by mouth 2 (two) times daily.    Dispense:  180 capsule    Refill:  3    . Icosapent Ethyl (VASCEPA) 1 g CAPS    Sig: Take 2 capsules (2 g total) by mouth 2 (two) times daily.    Dispense:  360 capsule    Refill:  3    Patient Instructions  Medication Instructions:  Start: Vascepa 2 g two times a day  If you need a refill on your cardiac medications before your next appointment, please call your pharmacy.   Lab work: None  Testing/Procedures: None  Follow-Up: At [facility], you and your health needs are our priority.  As part of our continuing mission to provide you with exceptional heart care, we have created designated Provider Care Teams.  These Care Teams include your primary Cardiologist (physician) and Advanced Practice Providers (APPs -  Physician Assistants and Nurse Practitioners) who all work together to provide you with the care you need, when you need it. You will need a follow up appointment in 3 months.  Please call our office 2 months in advance to schedule this appointment.  You may see Buford Dresser, MD or one of the following Advanced Practice Providers on your designated Care Team:   Rosaria Ferries, PA-C . Jory Sims, DNP, ANP        Signed, Buford Dresser, MD PhD 09/12/2018 11:10 AM    [place]

## 2018-09-14 DIAGNOSIS — I255 Ischemic cardiomyopathy: Secondary | ICD-10-CM | POA: Diagnosis not present

## 2018-09-14 DIAGNOSIS — G458 Other transient cerebral ischemic attacks and related syndromes: Secondary | ICD-10-CM | POA: Diagnosis not present

## 2018-09-14 DIAGNOSIS — E1342 Other specified diabetes mellitus with diabetic polyneuropathy: Secondary | ICD-10-CM | POA: Diagnosis not present

## 2018-09-14 DIAGNOSIS — E1165 Type 2 diabetes mellitus with hyperglycemia: Secondary | ICD-10-CM | POA: Diagnosis not present

## 2018-09-14 DIAGNOSIS — J449 Chronic obstructive pulmonary disease, unspecified: Secondary | ICD-10-CM | POA: Diagnosis not present

## 2018-09-14 DIAGNOSIS — E782 Mixed hyperlipidemia: Secondary | ICD-10-CM | POA: Diagnosis not present

## 2018-09-14 DIAGNOSIS — M179 Osteoarthritis of knee, unspecified: Secondary | ICD-10-CM | POA: Diagnosis not present

## 2018-09-14 DIAGNOSIS — E1140 Type 2 diabetes mellitus with diabetic neuropathy, unspecified: Secondary | ICD-10-CM | POA: Diagnosis not present

## 2018-09-14 DIAGNOSIS — I509 Heart failure, unspecified: Secondary | ICD-10-CM | POA: Diagnosis not present

## 2018-09-14 DIAGNOSIS — M1990 Unspecified osteoarthritis, unspecified site: Secondary | ICD-10-CM | POA: Diagnosis not present

## 2018-09-17 DIAGNOSIS — E119 Type 2 diabetes mellitus without complications: Secondary | ICD-10-CM | POA: Diagnosis not present

## 2018-09-20 ENCOUNTER — Telehealth: Payer: Self-pay

## 2018-09-20 NOTE — Telephone Encounter (Signed)
DPR on file. Spoke to pt wife who states that, for now, her husband will not be taking Rx for Vascepa since, per her report, the medication was optional if cost was prohibitive. Dr. Judeth Cornfield note on 09/12/2018 states the following:  "Discussed vascepa at length today. Gave sample, will see if he tolerates. If cost is prohibitive, would prioritize other medications first."  Pt wife returned samples of medication and was provided with info on how to get the med at lower cost in case pt reconsiders. Pt wife agreeable with this. Will route to Dr. Harrell Gave

## 2018-09-26 ENCOUNTER — Ambulatory Visit: Payer: Medicare Other | Admitting: Pulmonary Disease

## 2018-09-26 ENCOUNTER — Ambulatory Visit (INDEPENDENT_AMBULATORY_CARE_PROVIDER_SITE_OTHER): Payer: Medicare Other | Admitting: Pulmonary Disease

## 2018-09-26 ENCOUNTER — Encounter: Payer: Self-pay | Admitting: Pulmonary Disease

## 2018-09-26 VITALS — BP 140/60 | HR 63 | Ht 66.0 in | Wt 185.0 lb

## 2018-09-26 DIAGNOSIS — J439 Emphysema, unspecified: Secondary | ICD-10-CM | POA: Diagnosis not present

## 2018-09-26 DIAGNOSIS — I2583 Coronary atherosclerosis due to lipid rich plaque: Secondary | ICD-10-CM | POA: Diagnosis not present

## 2018-09-26 DIAGNOSIS — G4733 Obstructive sleep apnea (adult) (pediatric): Secondary | ICD-10-CM | POA: Diagnosis not present

## 2018-09-26 DIAGNOSIS — I2510 Atherosclerotic heart disease of native coronary artery without angina pectoris: Secondary | ICD-10-CM

## 2018-09-26 DIAGNOSIS — R0602 Shortness of breath: Secondary | ICD-10-CM

## 2018-09-26 LAB — PULMONARY FUNCTION TEST
FEF 25-75 Post: 1.45 L/sec
FEF 25-75 Pre: 1.35 L/sec
FEF2575-%Change-Post: 7 %
FEF2575-%Pred-Post: 73 %
FEF2575-%Pred-Pre: 68 %
FEV1-%Change-Post: 2 %
FEV1-%Pred-Post: 81 %
FEV1-%Pred-Pre: 79 %
FEV1-Post: 2.15 L
FEV1-Pre: 2.1 L
FEV1FVC-%Change-Post: 4 %
FEV1FVC-%Pred-Pre: 96 %
FEV6-%Change-Post: -1 %
FEV6-%Pred-Post: 86 %
FEV6-%Pred-Pre: 87 %
FEV6-Post: 2.94 L
FEV6-Pre: 2.98 L
FEV6FVC-%Change-Post: 0 %
FEV6FVC-%Pred-Post: 107 %
FEV6FVC-%Pred-Pre: 106 %
FVC-%Change-Post: -1 %
FVC-%Pred-Post: 80 %
FVC-%Pred-Pre: 81 %
FVC-Post: 2.94 L
FVC-Pre: 3 L
Post FEV1/FVC ratio: 73 %
Post FEV6/FVC ratio: 100 %
Pre FEV1/FVC ratio: 70 %
Pre FEV6/FVC Ratio: 99 %
RV % pred: 109 %
RV: 2.5 L
TLC % pred: 87 %
TLC: 5.46 L

## 2018-09-26 NOTE — Progress Notes (Signed)
Patient completed PFT today. Pt was unable to take deep enough breath to meet standards of the DLCO. Tried three times, and pt asked to stop.

## 2018-09-26 NOTE — Addendum Note (Signed)
Addended by: Annie Paras D on: 09/26/2018 02:20 PM   Modules accepted: Orders

## 2018-09-26 NOTE — Progress Notes (Signed)
Casey Lynch    [id number]    August 19, 1946  Primary Care Physician:Gates, Herbie [place], MD  Referring Physician: Josetta Huddle, MD [address] [place] [address] [place], [place] [address]  Chief complaint: Follow-up for dyspnea, pulmonary hypertension  HPI: 73 year old with history of diabetes, heart failure, nonischemic cardiomyopathy, hyperlipidemia, recent diagnosis of obstructive sleep apnea. Hospitalized for dyspnea and congestion.  Found to have heart failure in May 2019 with stress test showing small anterior wall ischemic defect.  Cardiac catheterization showed mild coronary artery disease Echocardiogram in May 2019 shows depressed ejection fraction of 37% and mild pulmonary hypertension. He had an evaluation of sleep apnea which showed moderate OSA with desaturations.  He has not started CPAP therapy at. He is scheduled to follow-up with cardiology on 07/11/2018.  Feels well at present with no respiratory complaints.  Denies any cough, sputum production, dyspnea, wheezing.  Pets: Has a dog.  No cats, birds, farm animals Occupation: Retired Company secretary Smoking history: 60-pack-year smoker.  Quit in 2017 Travel history: Travels to [place] for several months every year.  No other significant travel Relevant family history: No significant family history of lung disease.  Interim history: Currently on CPAP at 14.  States that he is doing well with that but has issues with mask slipping and leakage.  He wants to try nasal pillows Breathing is doing well.  Mild dyspnea on exertion.  No symptoms at rest.  Outpatient Encounter Medications as of 09/26/2018  Medication Sig  . aspirin 81 MG tablet Take 81 mg by mouth daily.  Marland Kitchen b complex vitamins capsule Take 1 capsule by mouth daily.  . colchicine (COLCRYS) 0.6 MG tablet Take 0.6 mg by mouth daily.  Marland Kitchen glimepiride (AMARYL) 4 MG tablet Take 4 mg by mouth daily with breakfast.  . Insulin Glargine, 2 Unit Dial, (TOUJEO MAX SOLOSTAR) 300  UNIT/ML SOPN Inject 20 Units into the skin. Inject 15 units subcutaneous daily   . metFORMIN (GLUCOPHAGE) 500 MG tablet Take 500 mg by mouth 2 (two) times daily.   . metoprolol succinate (TOPROL-XL) 25 MG 24 hr tablet Take 25 mg by mouth daily.  . naproxen sodium (ANAPROX) 220 MG tablet Take 220 mg by mouth 2 (two) times daily as needed (for pain.).  Marland Kitchen rosuvastatin (CRESTOR) 5 MG tablet Take 1 tablet (5 mg total) by mouth daily.  . sacubitril-valsartan (ENTRESTO) 97-103 MG Take 1 tablet by mouth 2 (two) times daily.  Marland Kitchen triamcinolone cream (KENALOG) 0.1 % Apply 1 application topically 2 (two) times daily as needed (for dry skin.).  . Turmeric 500 MG CAPS Take 1 capsule by mouth daily.  . Vitamin D, Ergocalciferol, 2000 units CAPS Take by mouth daily.  . Esomeprazole Magnesium (NEXIUM PO) Take by mouth.  Vanessa Kick Ethyl (VASCEPA) 1 g CAPS Take 2 capsules (2 g total) by mouth 2 (two) times daily. (Patient not taking: Reported on 09/26/2018)   No facility-administered encounter medications on file as of 09/26/2018.    Physical Exam: Blood pressure 140/60, pulse 63, height 5\' 6"  (1.676 m), weight 185 lb (83.9 kg), SpO2 93 %. Gen:      No acute distress HEENT:  EOMI, sclera anicteric Neck:     No masses; no thyromegaly Lungs:    Clear to auscultation bilaterally; normal respiratory effort CV:         Regular rate and rhythm; no murmurs Abd:      + bowel sounds; soft, non-tender; no palpable masses, no  distension Ext:    No edema; adequate peripheral perfusion Skin:      Warm and dry; no rash Neuro: alert and oriented x 3 Psych: normal mood and affect  Data Reviewed: Imaging: CT screening 06/28/2017- tiny pulmonary nodules measuring up to 2.2 mm.  Mild diffuse bronchial thickening, mild centrilobular and paraseptal emphysema.  I reviewed the images personally.  CT angio [outside report] 01/19/2018- No focal consolidation or infiltrate, mild dependent atelectasis, mild interlobular septal  thickening, mild mediastinal adenopathy, mild emphysema  PFTs:  09/26/2018 FVC 3 [81%), FEV1 2.10 [79%], F/F 70, TLC 87% Mild obstruction.  Unable to complete diffusion capacity  Sleep: Sleep study 03/16/18- moderate sleep apnea with overall AHI 22, desats to 88%. CPAP titration 04/10/18- recommend CPAP at 15 cm of water.  Assessment:  Pulmonary hypertension Likely secondary to left heart disease from nonischemic cardiomyopathy, sleep apnea and emphysema.  Sleep apnea Download reviewed with good compliance.  Residual AHI 3 Continue CPAP therapy Try nasal pillows for patient comfort.  COPD, emphysema No need for inhalers as he is asymptomatic.  Plan/Recommendations: - Continue CPAP - Order nasal pillows  Marshell Garfinkel MD [facility] 09/26/2018, 1:48 PM  CC: Josetta Huddle, MD

## 2018-09-26 NOTE — Patient Instructions (Addendum)
Your lung function test showed mild COPD.  No treatment needed as you do not have any symptoms. I am glad you are doing well with your CPAP machine We will place an order for nasal pillows to see you if this works out better for you Follow-up in 6 months

## 2018-10-01 DIAGNOSIS — K219 Gastro-esophageal reflux disease without esophagitis: Secondary | ICD-10-CM | POA: Diagnosis not present

## 2018-10-01 DIAGNOSIS — R1314 Dysphagia, pharyngoesophageal phase: Secondary | ICD-10-CM | POA: Diagnosis not present

## 2018-10-17 DIAGNOSIS — E1165 Type 2 diabetes mellitus with hyperglycemia: Secondary | ICD-10-CM | POA: Diagnosis not present

## 2018-10-17 DIAGNOSIS — E1342 Other specified diabetes mellitus with diabetic polyneuropathy: Secondary | ICD-10-CM | POA: Diagnosis not present

## 2018-10-17 DIAGNOSIS — K219 Gastro-esophageal reflux disease without esophagitis: Secondary | ICD-10-CM | POA: Diagnosis not present

## 2018-10-19 DIAGNOSIS — E119 Type 2 diabetes mellitus without complications: Secondary | ICD-10-CM | POA: Diagnosis not present

## 2018-11-05 ENCOUNTER — Telehealth: Payer: Self-pay | Admitting: Pulmonary Disease

## 2018-11-05 NOTE — Telephone Encounter (Signed)
Note was faxed to the number provided

## 2018-11-13 ENCOUNTER — Telehealth: Payer: Self-pay | Admitting: Pulmonary Disease

## 2018-11-13 NOTE — Telephone Encounter (Signed)
Called and spoke with Casey Lynch, he stated that he is needing the notes from patients OV on 09/26/18. Casey Lynch stated to me that he did not have Epic access. Notes have been printed and faxed to requested fax number. Nothing further needed.

## 2018-11-19 DIAGNOSIS — E1165 Type 2 diabetes mellitus with hyperglycemia: Secondary | ICD-10-CM | POA: Diagnosis not present

## 2018-11-19 DIAGNOSIS — E1140 Type 2 diabetes mellitus with diabetic neuropathy, unspecified: Secondary | ICD-10-CM | POA: Diagnosis not present

## 2018-11-19 DIAGNOSIS — M1990 Unspecified osteoarthritis, unspecified site: Secondary | ICD-10-CM | POA: Diagnosis not present

## 2018-11-19 DIAGNOSIS — I509 Heart failure, unspecified: Secondary | ICD-10-CM | POA: Diagnosis not present

## 2018-11-19 DIAGNOSIS — E782 Mixed hyperlipidemia: Secondary | ICD-10-CM | POA: Diagnosis not present

## 2018-11-19 DIAGNOSIS — M179 Osteoarthritis of knee, unspecified: Secondary | ICD-10-CM | POA: Diagnosis not present

## 2018-11-19 DIAGNOSIS — G458 Other transient cerebral ischemic attacks and related syndromes: Secondary | ICD-10-CM | POA: Diagnosis not present

## 2018-11-19 DIAGNOSIS — E1342 Other specified diabetes mellitus with diabetic polyneuropathy: Secondary | ICD-10-CM | POA: Diagnosis not present

## 2018-11-19 DIAGNOSIS — I255 Ischemic cardiomyopathy: Secondary | ICD-10-CM | POA: Diagnosis not present

## 2018-11-19 DIAGNOSIS — J449 Chronic obstructive pulmonary disease, unspecified: Secondary | ICD-10-CM | POA: Diagnosis not present

## 2018-11-21 ENCOUNTER — Encounter: Payer: Self-pay | Admitting: Podiatry

## 2018-11-21 ENCOUNTER — Ambulatory Visit (INDEPENDENT_AMBULATORY_CARE_PROVIDER_SITE_OTHER): Payer: Medicare Other | Admitting: Podiatry

## 2018-11-21 ENCOUNTER — Other Ambulatory Visit: Payer: Self-pay

## 2018-11-21 DIAGNOSIS — M7672 Peroneal tendinitis, left leg: Secondary | ICD-10-CM | POA: Diagnosis not present

## 2018-11-21 MED ORDER — TRIAMCINOLONE ACETONIDE 10 MG/ML IJ SUSP
10.0000 mg | Freq: Once | INTRAMUSCULAR | Status: AC
  Administered 2018-11-21: 10 mg

## 2018-11-25 NOTE — Progress Notes (Signed)
Subjective:   Patient ID: Casey Lynch, male   DOB: 73 y.o.   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  I did do sterile prep and injected carefully the sheath of the tendon 3 mg Kenalog 5 mg Xylocaine and advised on ice therapy at the current time

## 2018-12-03 DIAGNOSIS — E1342 Other specified diabetes mellitus with diabetic polyneuropathy: Secondary | ICD-10-CM | POA: Diagnosis not present

## 2018-12-03 DIAGNOSIS — K219 Gastro-esophageal reflux disease without esophagitis: Secondary | ICD-10-CM | POA: Diagnosis not present

## 2018-12-03 DIAGNOSIS — E782 Mixed hyperlipidemia: Secondary | ICD-10-CM | POA: Diagnosis not present

## 2018-12-03 DIAGNOSIS — E1165 Type 2 diabetes mellitus with hyperglycemia: Secondary | ICD-10-CM | POA: Diagnosis not present

## 2018-12-03 DIAGNOSIS — M1990 Unspecified osteoarthritis, unspecified site: Secondary | ICD-10-CM | POA: Diagnosis not present

## 2018-12-07 ENCOUNTER — Telehealth: Payer: Self-pay

## 2018-12-07 NOTE — Telephone Encounter (Signed)
Call patient left message to call the office

## 2018-12-11 ENCOUNTER — Encounter: Payer: Self-pay | Admitting: Cardiology

## 2018-12-11 ENCOUNTER — Telehealth (INDEPENDENT_AMBULATORY_CARE_PROVIDER_SITE_OTHER): Payer: Medicare Other | Admitting: Cardiology

## 2018-12-11 VITALS — BP 117/66 | HR 76 | Ht 66.0 in | Wt 181.0 lb

## 2018-12-11 DIAGNOSIS — R0602 Shortness of breath: Secondary | ICD-10-CM | POA: Diagnosis not present

## 2018-12-11 DIAGNOSIS — E11618 Type 2 diabetes mellitus with other diabetic arthropathy: Secondary | ICD-10-CM

## 2018-12-11 DIAGNOSIS — Z794 Long term (current) use of insulin: Secondary | ICD-10-CM | POA: Diagnosis not present

## 2018-12-11 DIAGNOSIS — I2583 Coronary atherosclerosis due to lipid rich plaque: Secondary | ICD-10-CM | POA: Diagnosis not present

## 2018-12-11 DIAGNOSIS — Z79899 Other long term (current) drug therapy: Secondary | ICD-10-CM | POA: Diagnosis not present

## 2018-12-11 DIAGNOSIS — J449 Chronic obstructive pulmonary disease, unspecified: Secondary | ICD-10-CM | POA: Diagnosis not present

## 2018-12-11 DIAGNOSIS — E782 Mixed hyperlipidemia: Secondary | ICD-10-CM

## 2018-12-11 DIAGNOSIS — E1140 Type 2 diabetes mellitus with diabetic neuropathy, unspecified: Secondary | ICD-10-CM | POA: Diagnosis not present

## 2018-12-11 DIAGNOSIS — I5042 Chronic combined systolic (congestive) and diastolic (congestive) heart failure: Secondary | ICD-10-CM | POA: Diagnosis not present

## 2018-12-11 DIAGNOSIS — I255 Ischemic cardiomyopathy: Secondary | ICD-10-CM | POA: Diagnosis not present

## 2018-12-11 DIAGNOSIS — E1165 Type 2 diabetes mellitus with hyperglycemia: Secondary | ICD-10-CM | POA: Diagnosis not present

## 2018-12-11 DIAGNOSIS — I2510 Atherosclerotic heart disease of native coronary artery without angina pectoris: Secondary | ICD-10-CM | POA: Diagnosis not present

## 2018-12-11 DIAGNOSIS — M1990 Unspecified osteoarthritis, unspecified site: Secondary | ICD-10-CM | POA: Diagnosis not present

## 2018-12-11 DIAGNOSIS — G458 Other transient cerebral ischemic attacks and related syndromes: Secondary | ICD-10-CM | POA: Diagnosis not present

## 2018-12-11 DIAGNOSIS — M179 Osteoarthritis of knee, unspecified: Secondary | ICD-10-CM | POA: Diagnosis not present

## 2018-12-11 DIAGNOSIS — I509 Heart failure, unspecified: Secondary | ICD-10-CM | POA: Diagnosis not present

## 2018-12-11 DIAGNOSIS — E1342 Other specified diabetes mellitus with diabetic polyneuropathy: Secondary | ICD-10-CM | POA: Diagnosis not present

## 2018-12-11 DIAGNOSIS — E7800 Pure hypercholesterolemia, unspecified: Secondary | ICD-10-CM | POA: Diagnosis not present

## 2018-12-11 NOTE — Patient Instructions (Addendum)
Medication Instructions:  Your Physician recommend you continue on your current medication as directed.    If you need a refill on your cardiac medications before your next appointment, please call your pharmacy.   Lab work: Your physician recommends that you return for lab work this week (Lipid, BMP, BNP, Mg).  If you have labs (blood work) drawn today and your tests are completely normal, you will receive your results only by: Marland Kitchen MyChart Message (if you have MyChart) OR . A paper copy in the mail If you have any lab test that is abnormal or we need to change your treatment, we will call you to review the results.  Testing/Procedures: None  Follow-Up: Your physician recommends that you schedule a follow-up appointment in 6 months with Dr. Harrell Gave.

## 2018-12-11 NOTE — Progress Notes (Signed)
Virtual Visit via Video Note   This visit type was conducted due to national recommendations for restrictions regarding the COVID-19 Pandemic (e.g. social distancing) in an effort to limit this patient's exposure and mitigate transmission in our community.  Due to his co-morbid illnesses, this patient is at least at moderate risk for complications without adequate follow up.  This format is felt to be most appropriate for this patient at this time.  All issues noted in this document were discussed and addressed.  A limited physical exam was performed with this format.  Please refer to the patient's chart for his consent to telehealth for [facility].   Evaluation Performed:  Follow-up visit  Date:  12/11/2018   ID:  Casey Lynch, DOB November 28, 1945, MRN [id number]  Patient Location: Home  Provider Location: Home  PCP:  Josetta Huddle, MD  Cardiologist:  Buford Dresser, MD  Electrophysiologist:  None   Chief Complaint:  Follow up  History of Present Illness:    Casey Lynch is a 73 y.o. male who presents via audio/video conferencing for a telehealth visit today.    The patient does not have symptoms concerning for COVID-19 infection (fever, chills, cough, or new shortness of breath).   Seen via video chat today with his wife. Doing well overall. Following shelter in place orders, no fevers/chills, etc. Sometimes has mild increase in shortness of breath. Able to continue to golf--stays away from others, sanitizes equipment, etc. Also has been cooking and eating more healthy foods at home. Tolerating all cardiac meds. Since starting the PPI, his chest burning has completely resolved, even to the point that they cancelled a planned EGD.  Overall feeling very well. Got a letter from Dr. Jeannetta Nap in [place] asking about repeat bloodwork. Discussed what I would like to follow in his bloodwork as well.  Went to pharmacy to get vascepa, it was over $500, did not fill. Discussed  alternatives to lower TG. Also has improved his diabetes control. Tolerating rosuvastatin 5 mg well, he would be amenable to titrating this dose up based on his lipid results.  Denies chest pain, shortness of breath at rest or with normal exertion. No PND, orthopnea, LE edema or unexpected weight gain. No syncope or palpitations.   Past Medical History:  Diagnosis Date  . Carotid bruit   . Diabetes mellitus without complication ([place])   . Eczema   . Heart failure ([place])   . History of colonic polyps   . Hypercholesterolemia   . Hyperlipidemia   . Macular degeneration   . Osteoarthritis   . Paresthesias   . Pseudogout   . Subclavian steal syndrome    Past Surgical History:  Procedure Laterality Date  . CARDIAC CATHETERIZATION    . COLONOSCOPY WITH PROPOFOL N/A 10/24/2016   Procedure: COLONOSCOPY WITH PROPOFOL;  Surgeon: Garlan Fair, MD;  Location: [facility];  Service: Endoscopy;  Laterality: N/A;  . HYDROCELE EXCISION / REPAIR    . KNEE SURGERY    . REFRACTIVE SURGERY    . TONSILLECTOMY       Current Meds  Medication Sig  . aspirin 81 MG tablet Take 81 mg by mouth daily.  Marland Kitchen b complex vitamins capsule Take 1 capsule by mouth daily.  . colchicine (COLCRYS) 0.6 MG tablet Take 0.6 mg by mouth daily.  . Esomeprazole Magnesium (NEXIUM PO) Take by mouth.  Marland Kitchen glimepiride (AMARYL) 4 MG tablet Take 4 mg by mouth daily with breakfast.  . Icosapent Ethyl (VASCEPA) 1  g CAPS Take 2 capsules (2 g total) by mouth 2 (two) times daily.  . Insulin Glargine, 2 Unit Dial, (TOUJEO MAX SOLOSTAR) 300 UNIT/ML SOPN Inject 20 Units into the skin. Inject 15 units subcutaneous daily   . metFORMIN (GLUCOPHAGE) 500 MG tablet Take 500 mg by mouth 2 (two) times daily.   . metoprolol succinate (TOPROL-XL) 25 MG 24 hr tablet Take 25 mg by mouth daily.  . naproxen sodium (ANAPROX) 220 MG tablet Take 220 mg by mouth 2 (two) times daily as needed (for pain.).  Marland Kitchen pantoprazole (PROTONIX) 40 MG tablet TAKE 1  TABLET BY MOUTH 30 MINUTES BEFORE FIRST MEAL OF THE DAY  . sacubitril-valsartan (ENTRESTO) 97-103 MG Take 1 tablet by mouth 2 (two) times daily.  Marland Kitchen triamcinolone cream (KENALOG) 0.1 % Apply 1 application topically 2 (two) times daily as needed (for dry skin.).  . Turmeric 500 MG CAPS Take 1 capsule by mouth daily.  . Vitamin D, Ergocalciferol, 2000 units CAPS Take by mouth daily.     Allergies:   Statins and Tetanus toxoids   Social History   Tobacco Use  . Smoking status: Former Smoker    Packs/day: 1.00    Years: 59.00    Pack years: 59.00    Types: Cigarettes    Last attempt to quit: 09/05/2016    Years since quitting: 2.2  . Smokeless tobacco: Never Used  Substance Use Topics  . Alcohol use: Yes    Alcohol/week: 0.0 standard drinks    Comment: Rare  . Drug use: No     Family Hx: The patient's family history includes Bipolar disorder in his brother; Cancer in his paternal grandmother; Diabetes in his brother; Heart failure in his father.  ROS:   Please see the history of present illness.   All other systems reviewed and are negative.   Prior CV studies:   The following studies were reviewed today: Al prior cardiac notes and studies reviewed  Labs/Other Tests and Data Reviewed:    EKG:  An ECG dated 07/2019 was personally reviewed today and demonstrated:  NSR with RBBB  Recent Labs: 07/11/2018: ALT 40; BUN 20; Creatinine, Ser 1.20; Potassium 5.1; Sodium 136   Recent Lipid Panel Lab Results  Component Value Date/Time   CHOL 250 (H) 07/11/2018 09:57 AM   TRIG 313 (H) 07/11/2018 09:57 AM   HDL 43 07/11/2018 09:57 AM   LDLCALC 144 (H) 07/11/2018 09:57 AM    Wt Readings from Last 3 Encounters:  12/11/18 181 lb (82.1 kg)  09/26/18 185 lb (83.9 kg)  09/12/18 179 lb 3.2 oz (81.3 kg)     Objective:    Vital Signs:  BP 117/66   Pulse 76   Ht 5\' 6"  (1.676 m)   Wt 181 lb (82.1 kg)   BMI 29.21 kg/m    Well nourished, well developed male in no acute distress.  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 -EF 42% on [place] records -tolerating maximum dose entresto -tolerating metoprolol succinate 25 mg daily -has weight based lasix PRN -did not tolerate spironolactone per report -NYHA class II now. Mild increase in shortness of breath -will check BMET. Mag and BNP.  CAD (predominantly RCA, but diffuse mild disease) Hyperlipidemia, intolerant of atorvastatin -tolerating rosuvastatin 5 mg. Will recheck lipids. He is amenable to uptitrating dose if needed -LDL  goal <70 -TG elevated prior, but has improved his blood sugar numbers. This may improve his TG -could not afford vascepa at >$500 -discussed diet and exercise recommendations -on aspirin 81 mg  Type II diabetes, on insulin -recheck A1c -did not tolerate SGLT2 inhibitor  Would like labs send to his cardiologist in [place]: Dr Newt Minion fax [phone]  COVID-19 Education: The signs and symptoms of COVID-19 were discussed with the patient and how to seek care for testing (follow up with PCP or arrange E-visit).  The importance of social distancing was discussed today.  Time:   Today, I have spent 25 minutes with the patient with telehealth technology discussing the above problems.    Patient Instructions  Medication Instructions:  Your Physician recommend you continue on your current medication as directed.    If you need a refill on your cardiac medications before your next appointment, please call your pharmacy.   Lab work: Your physician recommends that you return for lab work this week (Lipid, BMP, BNP, Mg).  If you have labs (blood work) drawn today and your tests are completely normal, you will receive your results only by: Marland Kitchen MyChart Message (if you have MyChart) OR . A paper copy in the mail If you have  any lab test that is abnormal or we need to change your treatment, we will call you to review the results.  Testing/Procedures: None  Follow-Up: Your physician recommends that you schedule a follow-up appointment in 6 months with Dr. Harrell Gave.       Medication Adjustments/Labs and Tests Ordered: Current medicines are reviewed at length with the patient today.  Concerns regarding medicines are outlined above.  Tests Ordered: Orders Placed This Encounter  Procedures  . Basic metabolic panel  . Lipid panel  . Brain natriuretic peptide  . Magnesium   Medication Changes: No orders of the defined types were placed in this encounter.   Disposition:  Follow up in 6 month(s)  Signed, Buford Dresser, MD  12/11/2018 7:50 AM    [facility]

## 2018-12-12 ENCOUNTER — Telehealth: Payer: Self-pay | Admitting: Cardiology

## 2018-12-12 ENCOUNTER — Ambulatory Visit: Payer: Medicare Other | Admitting: Cardiology

## 2018-12-12 DIAGNOSIS — Z79899 Other long term (current) drug therapy: Secondary | ICD-10-CM

## 2018-12-12 DIAGNOSIS — E119 Type 2 diabetes mellitus without complications: Secondary | ICD-10-CM | POA: Diagnosis not present

## 2018-12-12 NOTE — Telephone Encounter (Signed)
Request forwarded to Dr.Christopher for approval.

## 2018-12-12 NOTE — Telephone Encounter (Signed)
Lab orders placed.  

## 2018-12-12 NOTE — Telephone Encounter (Signed)
  Patient spoke with Dr Harrell Gave about his rheumatologist wanting to order some lab work and adding to what Dr Harrell Gave wants done. Dr. Gavin Pound would like to add CMP and CBC to the lab orders. He would like to come in on Friday to do lab work. Results can be faxed to Dr Trudie Reed at [phone].

## 2018-12-14 ENCOUNTER — Other Ambulatory Visit: Payer: Self-pay

## 2018-12-14 DIAGNOSIS — Z79899 Other long term (current) drug therapy: Secondary | ICD-10-CM | POA: Diagnosis not present

## 2018-12-14 DIAGNOSIS — Z794 Long term (current) use of insulin: Secondary | ICD-10-CM

## 2018-12-14 DIAGNOSIS — R0602 Shortness of breath: Secondary | ICD-10-CM | POA: Diagnosis not present

## 2018-12-14 DIAGNOSIS — E11618 Type 2 diabetes mellitus with other diabetic arthropathy: Secondary | ICD-10-CM

## 2018-12-14 DIAGNOSIS — I5042 Chronic combined systolic (congestive) and diastolic (congestive) heart failure: Secondary | ICD-10-CM | POA: Diagnosis not present

## 2018-12-15 LAB — COMPREHENSIVE METABOLIC PANEL
ALT: 27 IU/L (ref 0–44)
AST: 18 IU/L (ref 0–40)
Albumin/Globulin Ratio: 1.7 (ref 1.2–2.2)
Albumin: 4.5 g/dL (ref 3.7–4.7)
Alkaline Phosphatase: 87 IU/L (ref 39–117)
BUN/Creatinine Ratio: 17 (ref 10–24)
BUN: 22 mg/dL (ref 8–27)
Bilirubin Total: 0.2 mg/dL (ref 0.0–1.2)
CO2: 24 mmol/L (ref 20–29)
Calcium: 9.9 mg/dL (ref 8.6–10.2)
Chloride: 98 mmol/L (ref 96–106)
Creatinine, Ser: 1.27 mg/dL (ref 0.76–1.27)
GFR calc Af Amer: 64 mL/min/{1.73_m2} (ref >59)
GFR calc non Af Amer: 56 mL/min/{1.73_m2} — ABNORMAL LOW (ref >59)
Globulin, Total: 2.6 g/dL (ref 1.5–4.5)
Glucose: 128 mg/dL — ABNORMAL HIGH (ref 65–99)
Potassium: 5 mmol/L (ref 3.5–5.2)
Sodium: 137 mmol/L (ref 134–144)
Total Protein: 7.1 g/dL (ref 6.0–8.5)

## 2018-12-15 LAB — LIPID PANEL
Chol/HDL Ratio: 3.1 ratio (ref 0.0–5.0)
Cholesterol, Total: 143 mg/dL (ref 100–199)
HDL: 46 mg/dL (ref >39)
LDL Calculated: 70 mg/dL (ref 0–99)
Triglycerides: 134 mg/dL (ref 0–149)
VLDL Cholesterol Cal: 27 mg/dL (ref 5–40)

## 2018-12-15 LAB — CBC
Hematocrit: 37.6 % (ref 37.5–51.0)
Hemoglobin: 12.8 g/dL — ABNORMAL LOW (ref 13.0–17.7)
MCH: 30.1 pg (ref 26.6–33.0)
MCHC: 34 g/dL (ref 31.5–35.7)
MCV: 89 fL (ref 79–97)
Platelets: 325 x10E3/uL (ref 150–450)
RBC: 4.25 x10E6/uL (ref 4.14–5.80)
RDW: 13.2 % (ref 11.6–15.4)
WBC: 9.3 x10E3/uL (ref 3.4–10.8)

## 2018-12-15 LAB — BASIC METABOLIC PANEL
BUN/Creatinine Ratio: 17 (ref 10–24)
BUN: 21 mg/dL (ref 8–27)
CO2: 25 mmol/L (ref 20–29)
Calcium: 10.1 mg/dL (ref 8.6–10.2)
Chloride: 100 mmol/L (ref 96–106)
Creatinine, Ser: 1.26 mg/dL (ref 0.76–1.27)
GFR calc Af Amer: 65 mL/min/{1.73_m2} (ref >59)
GFR calc non Af Amer: 56 mL/min/{1.73_m2} — ABNORMAL LOW (ref >59)
Glucose: 132 mg/dL — ABNORMAL HIGH (ref 65–99)
Potassium: 5.2 mmol/L (ref 3.5–5.2)
Sodium: 140 mmol/L (ref 134–144)

## 2018-12-15 LAB — HGB A1C W/O EAG: Hgb A1c MFr Bld: 7.9 % — ABNORMAL HIGH (ref 4.8–5.6)

## 2018-12-15 LAB — MAGNESIUM: Magnesium: 1.8 mg/dL (ref 1.6–2.3)

## 2018-12-15 LAB — BRAIN NATRIURETIC PEPTIDE: BNP: 18.9 pg/mL (ref 0.0–100.0)

## 2018-12-17 ENCOUNTER — Ambulatory Visit: Payer: Medicare Other | Admitting: Pulmonary Disease

## 2018-12-19 ENCOUNTER — Telehealth: Payer: Self-pay | Admitting: Cardiology

## 2018-12-19 NOTE — Telephone Encounter (Signed)
F/U Message           Patient is returning Alisha's call

## 2018-12-19 NOTE — Telephone Encounter (Signed)
Left message to call back  

## 2018-12-20 DIAGNOSIS — L812 Freckles: Secondary | ICD-10-CM | POA: Diagnosis not present

## 2018-12-20 DIAGNOSIS — L718 Other rosacea: Secondary | ICD-10-CM | POA: Diagnosis not present

## 2018-12-20 DIAGNOSIS — L570 Actinic keratosis: Secondary | ICD-10-CM | POA: Diagnosis not present

## 2018-12-20 DIAGNOSIS — D1801 Hemangioma of skin and subcutaneous tissue: Secondary | ICD-10-CM | POA: Diagnosis not present

## 2018-12-20 NOTE — Telephone Encounter (Signed)
Follow up:    Patient returning call back from yesterday. Patient states there was no message left.

## 2018-12-21 NOTE — Progress Notes (Signed)
Typically the statin pain is large muscle pain in the thighs and hip region (like it's painful to rise from a seated position). It's not usually joint pain, and it is not usually associated with swelling. Is there something else new for him? He may want to talk to his PCP to see if it might be gout. If it's manageable, I would continue the statin, but if it is very bad, we can stop the statin for a few weeks and see what happens. Thanks.

## 2018-12-21 NOTE — Telephone Encounter (Signed)
Pt updated with lab results and voiced understanding. 

## 2018-12-25 ENCOUNTER — Ambulatory Visit: Payer: Medicare Other | Admitting: Pulmonary Disease

## 2018-12-26 DIAGNOSIS — M2550 Pain in unspecified joint: Secondary | ICD-10-CM | POA: Diagnosis not present

## 2018-12-26 DIAGNOSIS — M150 Primary generalized (osteo)arthritis: Secondary | ICD-10-CM | POA: Diagnosis not present

## 2018-12-26 DIAGNOSIS — M1120 Other chondrocalcinosis, unspecified site: Secondary | ICD-10-CM | POA: Diagnosis not present

## 2018-12-26 DIAGNOSIS — Z79899 Other long term (current) drug therapy: Secondary | ICD-10-CM | POA: Diagnosis not present

## 2018-12-26 DIAGNOSIS — M064 Inflammatory polyarthropathy: Secondary | ICD-10-CM | POA: Diagnosis not present

## 2019-01-22 DIAGNOSIS — M11849 Other specified crystal arthropathies, unspecified hand: Secondary | ICD-10-CM | POA: Diagnosis not present

## 2019-01-22 DIAGNOSIS — M7989 Other specified soft tissue disorders: Secondary | ICD-10-CM | POA: Diagnosis not present

## 2019-02-04 DIAGNOSIS — E119 Type 2 diabetes mellitus without complications: Secondary | ICD-10-CM | POA: Diagnosis not present

## 2019-02-12 ENCOUNTER — Telehealth: Payer: Self-pay | Admitting: Cardiology

## 2019-02-12 NOTE — Telephone Encounter (Signed)
iphone/ consent/ my chart active/ pre reg completed  °

## 2019-02-13 DIAGNOSIS — K219 Gastro-esophageal reflux disease without esophagitis: Secondary | ICD-10-CM | POA: Diagnosis not present

## 2019-02-13 DIAGNOSIS — R1314 Dysphagia, pharyngoesophageal phase: Secondary | ICD-10-CM | POA: Diagnosis not present

## 2019-02-15 DIAGNOSIS — I509 Heart failure, unspecified: Secondary | ICD-10-CM | POA: Diagnosis not present

## 2019-02-15 DIAGNOSIS — E1165 Type 2 diabetes mellitus with hyperglycemia: Secondary | ICD-10-CM | POA: Diagnosis not present

## 2019-02-15 DIAGNOSIS — E782 Mixed hyperlipidemia: Secondary | ICD-10-CM | POA: Diagnosis not present

## 2019-02-15 DIAGNOSIS — E1140 Type 2 diabetes mellitus with diabetic neuropathy, unspecified: Secondary | ICD-10-CM | POA: Diagnosis not present

## 2019-02-15 DIAGNOSIS — I255 Ischemic cardiomyopathy: Secondary | ICD-10-CM | POA: Diagnosis not present

## 2019-02-15 DIAGNOSIS — M1990 Unspecified osteoarthritis, unspecified site: Secondary | ICD-10-CM | POA: Diagnosis not present

## 2019-02-15 DIAGNOSIS — M179 Osteoarthritis of knee, unspecified: Secondary | ICD-10-CM | POA: Diagnosis not present

## 2019-02-15 DIAGNOSIS — E7800 Pure hypercholesterolemia, unspecified: Secondary | ICD-10-CM | POA: Diagnosis not present

## 2019-02-15 DIAGNOSIS — G458 Other transient cerebral ischemic attacks and related syndromes: Secondary | ICD-10-CM | POA: Diagnosis not present

## 2019-02-15 DIAGNOSIS — E1342 Other specified diabetes mellitus with diabetic polyneuropathy: Secondary | ICD-10-CM | POA: Diagnosis not present

## 2019-02-15 DIAGNOSIS — J449 Chronic obstructive pulmonary disease, unspecified: Secondary | ICD-10-CM | POA: Diagnosis not present

## 2019-02-18 ENCOUNTER — Telehealth (INDEPENDENT_AMBULATORY_CARE_PROVIDER_SITE_OTHER): Payer: Medicare Other | Admitting: Cardiology

## 2019-02-18 ENCOUNTER — Telehealth (HOSPITAL_COMMUNITY): Payer: Self-pay | Admitting: *Deleted

## 2019-02-18 ENCOUNTER — Telehealth: Payer: Self-pay

## 2019-02-18 ENCOUNTER — Encounter: Payer: Self-pay | Admitting: Cardiology

## 2019-02-18 VITALS — BP 106/65 | HR 66 | Ht 66.5 in | Wt 177.4 lb

## 2019-02-18 DIAGNOSIS — E1159 Type 2 diabetes mellitus with other circulatory complications: Secondary | ICD-10-CM | POA: Diagnosis not present

## 2019-02-18 DIAGNOSIS — I5042 Chronic combined systolic (congestive) and diastolic (congestive) heart failure: Secondary | ICD-10-CM | POA: Diagnosis not present

## 2019-02-18 DIAGNOSIS — I2510 Atherosclerotic heart disease of native coronary artery without angina pectoris: Secondary | ICD-10-CM | POA: Diagnosis not present

## 2019-02-18 DIAGNOSIS — Z794 Long term (current) use of insulin: Secondary | ICD-10-CM

## 2019-02-18 DIAGNOSIS — M7989 Other specified soft tissue disorders: Secondary | ICD-10-CM | POA: Diagnosis not present

## 2019-02-18 DIAGNOSIS — I2583 Coronary atherosclerosis due to lipid rich plaque: Secondary | ICD-10-CM

## 2019-02-18 DIAGNOSIS — R0602 Shortness of breath: Secondary | ICD-10-CM | POA: Diagnosis not present

## 2019-02-18 DIAGNOSIS — E782 Mixed hyperlipidemia: Secondary | ICD-10-CM | POA: Diagnosis not present

## 2019-02-18 DIAGNOSIS — Z7189 Other specified counseling: Secondary | ICD-10-CM | POA: Diagnosis not present

## 2019-02-18 MED ORDER — SACUBITRIL-VALSARTAN 97-103 MG PO TABS: 1.0000 | ORAL_TABLET | Freq: Two times a day (BID) | ORAL | 3 refills | Status: DC

## 2019-02-18 MED ORDER — METOPROLOL SUCCINATE ER 25 MG PO TB24: 25.0000 mg | ORAL_TABLET | Freq: Every day | ORAL | 3 refills | Status: DC

## 2019-02-18 NOTE — Progress Notes (Signed)
Virtual Visit via Video Note   This visit type was conducted due to national recommendations for restrictions regarding the COVID-19 Pandemic (e.g. social distancing) in an effort to limit this patient's exposure and mitigate transmission in our community.  Due to his co-morbid illnesses, this patient is at least at moderate risk for complications without adequate follow up.  This format is felt to be most appropriate for this patient at this time.  All issues noted in this document were discussed and addressed.  A limited physical exam was performed with this format.  Please refer to the patient's chart for his consent to telehealth for [facility].   Date:  02/18/2019   ID:  Casey Lynch, DOB 07/23/1946, MRN [id number]  Patient Location: Home Provider Location: Home  PCP:  Josetta Huddle, MD  Cardiologist:  Buford Dresser, MD  Electrophysiologist:  None   Evaluation Performed:  Follow-Up Visit  Chief Complaint:  SOB, right hand swelling  History of Present Illness:    Casey Lynch is a 73 y.o. male with PMH CAD, chronic systolic and diastolic heart failure, hyperlipidemia, PAD (carotid bruit <50% bilaterally, left subclavian steal syndrome), COPD, former tobacco abuse, OSA on CPAP, type II diabetes on long term insulin.  The patient does not have symptoms concerning for COVID-19 infection (fever, chills, cough, or new shortness of breath).   Main concerns today: Shortness of breath: worsening. He is walking more, feels short of breath with any incline whatsoever. No chest pain with it. Walking more as part of weight loss, has lost 8 lbs in 2 months with Noom. Reviewed PFTs from 09/2018. No cough or infectious symptoms.  Right hand swelling: has some right shoulder pain, but has noticed progressively worsening right hand swelling. No weakness, tingling, numbness. Feels tight, notices it when he grips golf club.  Denies chest pain, shortness of breath at rest. No PND,  orthopnea, LE edema or unexpected weight gain. No syncope or palpitations.   Past Medical History:  Diagnosis Date   Carotid bruit    Diabetes mellitus without complication (H[facility])    Eczema    Heart failure ([place])    History of colonic polyps    Hypercholesterolemia    Hyperlipidemia    Macular degeneration    Osteoarthritis    Paresthesias    Pseudogout    Subclavian steal syndrome    Past Surgical History:  Procedure Laterality Date   CARDIAC CATHETERIZATION     COLONOSCOPY WITH PROPOFOL N/A 10/24/2016   Procedure: COLONOSCOPY WITH PROPOFOL;  Surgeon: Garlan Fair, MD;  Location: [facility];  Service: Endoscopy;  Laterality: N/A;   HYDROCELE EXCISION / REPAIR     KNEE SURGERY     REFRACTIVE SURGERY     TONSILLECTOMY       Current Meds  Medication Sig   aspirin 81 MG tablet Take 81 mg by mouth daily.   b complex vitamins capsule Take 1 capsule by mouth daily.   colchicine (COLCRYS) 0.6 MG tablet Take 0.6 mg by mouth daily.   glimepiride (AMARYL) 4 MG tablet Take 4 mg by mouth daily with breakfast.   Insulin Glargine (BASAGLAR KWIKPEN [place]) Inject into the skin. SLIDING SCALE DEPENDING ON BLOOD SUGARS   metFORMIN (GLUCOPHAGE) 500 MG tablet Take 1,000 mg by mouth 2 (two) times daily.    metoprolol succinate (TOPROL-XL) 25 MG 24 hr tablet Take 1 tablet (25 mg total) by mouth daily.   naproxen sodium (ANAPROX) 220 MG tablet Take 220 mg  by mouth 2 (two) times daily as needed (for pain.).   pantoprazole (PROTONIX) 40 MG tablet TAKE 1 TABLET BY MOUTH 30 MINUTES BEFORE FIRST MEAL OF THE DAY   sacubitril-valsartan (ENTRESTO) 97-103 MG Take 1 tablet by mouth 2 (two) times daily.   triamcinolone cream (KENALOG) 0.1 % Apply 1 application topically 2 (two) times daily as needed (for dry skin.).   Turmeric 500 MG CAPS Take 1 capsule by mouth daily.   Vitamin D, Ergocalciferol, 2000 units CAPS Take by mouth daily.   [DISCONTINUED] metoprolol succinate  (TOPROL-XL) 25 MG 24 hr tablet Take 25 mg by mouth daily.   [DISCONTINUED] sacubitril-valsartan (ENTRESTO) 97-103 MG Take 1 tablet by mouth 2 (two) times daily.     Allergies:   Statins and Tetanus toxoids   Social History   Tobacco Use   Smoking status: Former Smoker    Packs/day: 1.00    Years: 59.00    Pack years: 59.00    Types: Cigarettes    Quit date: 09/05/2016    Years since quitting: 2.4   Smokeless tobacco: Never Used  Substance Use Topics   Alcohol use: Yes    Alcohol/week: 0.0 standard drinks    Comment: Rare   Drug use: No     Family Hx: The patient's family history includes Bipolar disorder in his brother; Cancer in his paternal grandmother; Diabetes in his brother; Heart failure in his father.  ROS:   Please see the history of present illness.    Constitutional: Negative for chills, fever, night sweats, unintentional weight loss  HENT: Negative for ear pain and hearing loss.   Eyes: Negative for loss of vision and eye pain.  Respiratory: Negative for cough, sputum, wheezing.   Cardiovascular: See HPI. Gastrointestinal: Negative for abdominal pain, melena, and hematochezia.  Genitourinary: Negative for dysuria and hematuria.  Musculoskeletal: Negative for falls and myalgias.  Skin: Negative for itching and rash.  Neurological: Negative for focal weakness, focal sensory changes and loss of consciousness.  Endo/Heme/Allergies: Does not bruise/bleed easily.  All other systems reviewed and are negative.   Prior CV studies:   The following studies were reviewed today: Historical records re: cath and echo  Labs/Other Tests and Data Reviewed:    EKG:  An ECG dated 07/12/18 was personally reviewed today and demonstrated:  NSR, RBBB  Recent Labs: 12/14/2018: ALT 27; BNP 18.9; BUN 22; Creatinine, Ser 1.27; Hemoglobin 12.8; Magnesium 1.8; Platelets 325; Potassium 5.0; Sodium 137   Recent Lipid Panel Lab Results  Component Value Date/Time   CHOL 143  12/14/2018 08:31 AM   TRIG 134 12/14/2018 08:31 AM   HDL 46 12/14/2018 08:31 AM   CHOLHDL 3.1 12/14/2018 08:31 AM   LDLCALC 70 12/14/2018 08:31 AM    Wt Readings from Last 3 Encounters:  02/18/19 177 lb 6.4 oz (80.5 kg)  12/11/18 181 lb (82.1 kg)  09/26/18 185 lb (83.9 kg)     Objective:    Vital Signs:  BP 106/65    Pulse 66    Ht 5' 6.5" (1.689 m)    Wt 177 lb 6.4 oz (80.5 kg)    SpO2 94%    BMI 28.20 kg/m    VITAL SIGNS:  reviewed GEN:  no acute distress EYES:  sclerae anicteric, EOMI - Extraocular Movements Intact RESPIRATORY:  normal respiratory effort, symmetric expansion CARDIOVASCULAR:  no visible jvd SKIN:  no rash, lesions or ulcers. MUSCULOSKELETAL:  right fingers and hand with mild increase in swelling compared to left, symmetric,  able to flex/extend, no discoloration NEURO:  alert and oriented x 3, no obvious focal deficit PSYCH:  normal affect  ASSESSMENT & PLAN:    Shortness of breath: progressive, exertional. No other clinical signs of heart failure such as LE edema, weight gain (actually working to lose weight), PND, orthopnea. Differential includes COPD (though recently had PFTs), deconditioning, etc -will repeat echo to evaluate cardiac structure and function -if echo unchanged, consider further evaluation (treadmill stress vs. Trial of inahler)  Right hand swelling: localized to right hand but symmetric throughout. Has been favoring that arm due to shoulder pain. No loss of sensation or strength per report. Has L subclavian steal but no known issues on the right side.  -with no discoloration, numbness, etc less suggestive of an arterial process. Could not examine pulses virtually today -would consider lymphatic or venous impingement/obstruction. 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 -EF 42% on [place] records, repeated ordered today -tolerating maximum dose entresto -tolerating metoprolol succinate 25 mg daily -has weight based lasix PRN -did not tolerate spironolactone per report -NYHA class II, shortness of breath as above -unlikely to be traveling to [place] given COVID, will be local for testing/follow up.  CAD (predominantly RCA, but diffuse mild disease) Hyperlipidemia, intolerant of atorvastatin -tolerating rosuvastatin 5 mg. -repeat lipids in 12/2018 note LDL of 70, much improved from 144. -LDL goal <70 -TG improved to 134 from 313. -could not afford vascepa at >$500, but TG now improved -discussed diet and exercise recommendations. Has lost 8 lbs with Noom, congratulated. -on aspirin 81 mg  Type II diabetes, on insulin -recent A1c 7.9 -did not tolerate SGLT2 inhibitor  Cardiologist in [place]: Dr Newt Minion fax [phone]  COVID-19 Education: The signs and symptoms of COVID-19 were discussed with the patient and how to seek care for testing (follow up with PCP or arrange E-visit).  The importance of social distancing was discussed today.  Time:   Today, I have spent 27 minutes with the patient with telehealth technology discussing the above problems.    Patient Instructions  Medication Instructions:  Your Physician recommend you continue on your current medication as directed.    If you need a refill on your cardiac medications before your next appointment, please call your pharmacy.   Lab work: None  Testing/Procedures: Your physician has requested that you have an echocardiogram. Echocardiography is a painless test that uses sound waves to create images of your heart. It provides your doctor with information about the size and shape of your  heart and how well your hearts chambers and valves are working. This procedure takes approximately one hour. There are no restrictions for this procedure. [address] [address]   Follow-Up: At [facility], you and your health needs are our priority.  As part of our continuing mission to provide you with exceptional heart care, we have created designated Provider Care Teams.  These Care Teams include your primary Cardiologist (physician) and Advanced Practice Providers (APPs -  Physician Assistants and Nurse Practitioners) who all work together to provide you with the care you need, when you need it. You will need a follow up appointment in 6 weeks.  Please call our office 2 months in advance to schedule this appointment.  You may see Buford Dresser, MD or one of the following Advanced Practice Providers on your designated Care Team:   Rosaria Ferries, PA-C  Jory Sims, DNP, ANP       Medication Adjustments/Labs and Tests Ordered: Current medicines are reviewed at length with the patient today.  Concerns regarding medicines are outlined above.   Tests Ordered: Orders Placed This Encounter  Procedures   ECHOCARDIOGRAM COMPLETE    Medication Changes: Meds ordered this encounter  Medications   metoprolol succinate (TOPROL-XL) 25 MG 24 hr tablet    Sig: Take 1 tablet (25 mg total) by mouth daily.    Dispense:  90 tablet    Refill:  3   sacubitril-valsartan (ENTRESTO) 97-103 MG    Sig: Take 1 tablet by mouth 2 (two) times daily.    Dispense:  180 tablet    Refill:  3    Follow Up:  6 weeks or sooner PRN  Signed, Buford Dresser, MD  02/18/2019 9:53 PM    [facility]

## 2019-02-18 NOTE — Telephone Encounter (Signed)

## 2019-02-18 NOTE — Patient Instructions (Signed)
Medication Instructions:  Your Physician recommend you continue on your current medication as directed.    If you need a refill on your cardiac medications before your next appointment, please call your pharmacy.   Lab work: None  Testing/Procedures: Your physician has requested that you have an echocardiogram. Echocardiography is a painless test that uses sound waves to create images of your heart. It provides your doctor with information about the size and shape of your heart and how well your heart's chambers and valves are working. This procedure takes approximately one hour. There are no restrictions for this procedure. [place] [address]   Follow-Up: At [facility], you and your health needs are our priority.  As part of our continuing mission to provide you with exceptional heart care, we have created designated Provider Care Teams.  These Care Teams include your primary Cardiologist (physician) and Advanced Practice Providers (APPs -  Physician Assistants and Nurse Practitioners) who all work together to provide you with the care you need, when you need it. You will need a follow up appointment in 6 weeks.  Please call our office 2 months in advance to schedule this appointment.  You may see Buford Dresser, MD or one of the following Advanced Practice Providers on your designated Care Team:   Rosaria Ferries, PA-C . Jory Sims, DNP, ANP

## 2019-02-18 NOTE — Telephone Encounter (Signed)
COVID-19 Pre-Screening Questions:  . Do you currently have a fever? NO (yes = cancel and refer to pcp for e-visit) . Have you recently travelled on a cruise, internationally, or to [place], [place], [place], [place], [place], or [place], [place] ([employer]) ? NO (yes = cancel, stay home, monitor symptoms, and contact pcp or initiate e-visit if symptoms develop) . Have you been in contact with someone that is currently pending confirmation of Covid19 testing or has been confirmed to have the Covid19 virus?  NO (yes = cancel, stay home, away from tested individual, monitor symptoms, and contact pcp or initiate e-visit if symptoms develop) . Are you currently experiencing fatigue or cough? NO (yes = pt should be prepared to have a mask placed at the time of their visit).   . Reiterated no additional visitors. . Arrive no earlier than 15 minutes before appointment time. . Please bring own mask.  Casey Lynch 

## 2019-02-19 ENCOUNTER — Ambulatory Visit (HOSPITAL_COMMUNITY): Payer: Medicare Other | Attending: Internal Medicine

## 2019-02-19 ENCOUNTER — Telehealth: Payer: Self-pay | Admitting: Cardiology

## 2019-02-19 ENCOUNTER — Other Ambulatory Visit: Payer: Self-pay

## 2019-02-19 DIAGNOSIS — R0602 Shortness of breath: Secondary | ICD-10-CM | POA: Insufficient documentation

## 2019-02-19 NOTE — Telephone Encounter (Signed)
LVM for patient to call and schedule 6 week followup with Dr. Harrell Gave.

## 2019-02-26 ENCOUNTER — Other Ambulatory Visit: Payer: Self-pay | Admitting: *Deleted

## 2019-02-26 DIAGNOSIS — Z87891 Personal history of nicotine dependence: Secondary | ICD-10-CM

## 2019-02-26 DIAGNOSIS — Z122 Encounter for screening for malignant neoplasm of respiratory organs: Secondary | ICD-10-CM

## 2019-03-04 DIAGNOSIS — Z8619 Personal history of other infectious and parasitic diseases: Secondary | ICD-10-CM | POA: Diagnosis not present

## 2019-03-04 DIAGNOSIS — E119 Type 2 diabetes mellitus without complications: Secondary | ICD-10-CM | POA: Diagnosis not present

## 2019-03-04 DIAGNOSIS — K2930 Chronic superficial gastritis without bleeding: Secondary | ICD-10-CM | POA: Diagnosis not present

## 2019-03-04 DIAGNOSIS — R1013 Epigastric pain: Secondary | ICD-10-CM | POA: Diagnosis not present

## 2019-03-04 DIAGNOSIS — K228 Other specified diseases of esophagus: Secondary | ICD-10-CM | POA: Diagnosis not present

## 2019-03-04 DIAGNOSIS — R1310 Dysphagia, unspecified: Secondary | ICD-10-CM | POA: Diagnosis not present

## 2019-03-06 DIAGNOSIS — K228 Other specified diseases of esophagus: Secondary | ICD-10-CM | POA: Diagnosis not present

## 2019-03-06 DIAGNOSIS — K2930 Chronic superficial gastritis without bleeding: Secondary | ICD-10-CM | POA: Diagnosis not present

## 2019-03-20 DIAGNOSIS — I509 Heart failure, unspecified: Secondary | ICD-10-CM | POA: Diagnosis not present

## 2019-03-20 DIAGNOSIS — E1165 Type 2 diabetes mellitus with hyperglycemia: Secondary | ICD-10-CM | POA: Diagnosis not present

## 2019-03-20 DIAGNOSIS — E782 Mixed hyperlipidemia: Secondary | ICD-10-CM | POA: Diagnosis not present

## 2019-03-20 DIAGNOSIS — E1140 Type 2 diabetes mellitus with diabetic neuropathy, unspecified: Secondary | ICD-10-CM | POA: Diagnosis not present

## 2019-03-20 DIAGNOSIS — E1342 Other specified diabetes mellitus with diabetic polyneuropathy: Secondary | ICD-10-CM | POA: Diagnosis not present

## 2019-03-20 DIAGNOSIS — E7800 Pure hypercholesterolemia, unspecified: Secondary | ICD-10-CM | POA: Diagnosis not present

## 2019-03-20 DIAGNOSIS — G458 Other transient cerebral ischemic attacks and related syndromes: Secondary | ICD-10-CM | POA: Diagnosis not present

## 2019-03-20 DIAGNOSIS — J449 Chronic obstructive pulmonary disease, unspecified: Secondary | ICD-10-CM | POA: Diagnosis not present

## 2019-03-20 DIAGNOSIS — I255 Ischemic cardiomyopathy: Secondary | ICD-10-CM | POA: Diagnosis not present

## 2019-03-20 DIAGNOSIS — M1990 Unspecified osteoarthritis, unspecified site: Secondary | ICD-10-CM | POA: Diagnosis not present

## 2019-03-20 DIAGNOSIS — M179 Osteoarthritis of knee, unspecified: Secondary | ICD-10-CM | POA: Diagnosis not present

## 2019-03-21 DIAGNOSIS — D135 Benign neoplasm of extrahepatic bile ducts: Secondary | ICD-10-CM | POA: Diagnosis not present

## 2019-03-21 DIAGNOSIS — Z1159 Encounter for screening for other viral diseases: Secondary | ICD-10-CM | POA: Diagnosis not present

## 2019-03-21 DIAGNOSIS — Z01812 Encounter for preprocedural laboratory examination: Secondary | ICD-10-CM | POA: Diagnosis not present

## 2019-03-21 DIAGNOSIS — D132 Benign neoplasm of duodenum: Secondary | ICD-10-CM | POA: Diagnosis not present

## 2019-03-27 DIAGNOSIS — M1120 Other chondrocalcinosis, unspecified site: Secondary | ICD-10-CM | POA: Diagnosis not present

## 2019-03-27 DIAGNOSIS — Z79899 Other long term (current) drug therapy: Secondary | ICD-10-CM | POA: Diagnosis not present

## 2019-03-27 DIAGNOSIS — M2550 Pain in unspecified joint: Secondary | ICD-10-CM | POA: Diagnosis not present

## 2019-03-27 DIAGNOSIS — E089 Diabetes mellitus due to underlying condition without complications: Secondary | ICD-10-CM | POA: Diagnosis not present

## 2019-03-27 DIAGNOSIS — Z6827 Body mass index (BMI) 27.0-27.9, adult: Secondary | ICD-10-CM | POA: Diagnosis not present

## 2019-03-27 DIAGNOSIS — M150 Primary generalized (osteo)arthritis: Secondary | ICD-10-CM | POA: Diagnosis not present

## 2019-03-27 DIAGNOSIS — E663 Overweight: Secondary | ICD-10-CM | POA: Diagnosis not present

## 2019-03-28 DIAGNOSIS — Z9889 Other specified postprocedural states: Secondary | ICD-10-CM | POA: Diagnosis not present

## 2019-03-28 DIAGNOSIS — Z9689 Presence of other specified functional implants: Secondary | ICD-10-CM | POA: Diagnosis not present

## 2019-03-28 DIAGNOSIS — Z7984 Long term (current) use of oral hypoglycemic drugs: Secondary | ICD-10-CM | POA: Diagnosis not present

## 2019-03-28 DIAGNOSIS — Z7982 Long term (current) use of aspirin: Secondary | ICD-10-CM | POA: Diagnosis not present

## 2019-03-28 DIAGNOSIS — D135 Benign neoplasm of extrahepatic bile ducts: Secondary | ICD-10-CM | POA: Diagnosis not present

## 2019-03-28 DIAGNOSIS — K219 Gastro-esophageal reflux disease without esophagitis: Secondary | ICD-10-CM | POA: Diagnosis not present

## 2019-03-28 DIAGNOSIS — I110 Hypertensive heart disease with heart failure: Secondary | ICD-10-CM | POA: Diagnosis not present

## 2019-03-28 DIAGNOSIS — I272 Pulmonary hypertension, unspecified: Secondary | ICD-10-CM | POA: Diagnosis not present

## 2019-03-28 DIAGNOSIS — D132 Benign neoplasm of duodenum: Secondary | ICD-10-CM | POA: Diagnosis not present

## 2019-03-28 DIAGNOSIS — I5022 Chronic systolic (congestive) heart failure: Secondary | ICD-10-CM | POA: Diagnosis not present

## 2019-03-28 DIAGNOSIS — Z79899 Other long term (current) drug therapy: Secondary | ICD-10-CM | POA: Diagnosis not present

## 2019-03-28 DIAGNOSIS — I255 Ischemic cardiomyopathy: Secondary | ICD-10-CM | POA: Diagnosis not present

## 2019-03-28 DIAGNOSIS — E119 Type 2 diabetes mellitus without complications: Secondary | ICD-10-CM | POA: Diagnosis not present

## 2019-03-28 DIAGNOSIS — I340 Nonrheumatic mitral (valve) insufficiency: Secondary | ICD-10-CM | POA: Diagnosis not present

## 2019-03-28 DIAGNOSIS — I509 Heart failure, unspecified: Secondary | ICD-10-CM | POA: Diagnosis not present

## 2019-03-29 DIAGNOSIS — I5022 Chronic systolic (congestive) heart failure: Secondary | ICD-10-CM | POA: Diagnosis not present

## 2019-03-29 DIAGNOSIS — K219 Gastro-esophageal reflux disease without esophagitis: Secondary | ICD-10-CM | POA: Diagnosis not present

## 2019-03-29 DIAGNOSIS — I272 Pulmonary hypertension, unspecified: Secondary | ICD-10-CM | POA: Diagnosis not present

## 2019-03-29 DIAGNOSIS — I340 Nonrheumatic mitral (valve) insufficiency: Secondary | ICD-10-CM | POA: Diagnosis not present

## 2019-03-29 DIAGNOSIS — Z9689 Presence of other specified functional implants: Secondary | ICD-10-CM | POA: Diagnosis not present

## 2019-03-29 DIAGNOSIS — I110 Hypertensive heart disease with heart failure: Secondary | ICD-10-CM | POA: Diagnosis not present

## 2019-03-29 DIAGNOSIS — E119 Type 2 diabetes mellitus without complications: Secondary | ICD-10-CM | POA: Diagnosis not present

## 2019-03-29 DIAGNOSIS — D135 Benign neoplasm of extrahepatic bile ducts: Secondary | ICD-10-CM | POA: Diagnosis not present

## 2019-03-29 DIAGNOSIS — I509 Heart failure, unspecified: Secondary | ICD-10-CM | POA: Diagnosis not present

## 2019-03-29 DIAGNOSIS — Z9889 Other specified postprocedural states: Secondary | ICD-10-CM | POA: Diagnosis not present

## 2019-04-01 DIAGNOSIS — E119 Type 2 diabetes mellitus without complications: Secondary | ICD-10-CM | POA: Diagnosis not present

## 2019-04-02 ENCOUNTER — Telehealth (INDEPENDENT_AMBULATORY_CARE_PROVIDER_SITE_OTHER): Payer: Medicare Other | Admitting: Cardiology

## 2019-04-02 ENCOUNTER — Telehealth: Payer: Self-pay

## 2019-04-02 ENCOUNTER — Telehealth: Payer: Self-pay | Admitting: Cardiology

## 2019-04-02 ENCOUNTER — Encounter: Payer: Self-pay | Admitting: Cardiology

## 2019-04-02 VITALS — Ht 66.0 in | Wt 170.8 lb

## 2019-04-02 DIAGNOSIS — I2583 Coronary atherosclerosis due to lipid rich plaque: Secondary | ICD-10-CM | POA: Diagnosis not present

## 2019-04-02 DIAGNOSIS — Z794 Long term (current) use of insulin: Secondary | ICD-10-CM | POA: Diagnosis not present

## 2019-04-02 DIAGNOSIS — Z789 Other specified health status: Secondary | ICD-10-CM

## 2019-04-02 DIAGNOSIS — I2510 Atherosclerotic heart disease of native coronary artery without angina pectoris: Secondary | ICD-10-CM | POA: Diagnosis not present

## 2019-04-02 DIAGNOSIS — E782 Mixed hyperlipidemia: Secondary | ICD-10-CM

## 2019-04-02 DIAGNOSIS — I5042 Chronic combined systolic (congestive) and diastolic (congestive) heart failure: Secondary | ICD-10-CM

## 2019-04-02 DIAGNOSIS — E1159 Type 2 diabetes mellitus with other circulatory complications: Secondary | ICD-10-CM | POA: Diagnosis not present

## 2019-04-02 NOTE — Progress Notes (Signed)
.    Virtual Visit via Video Note   This visit type was conducted due to national recommendations for restrictions regarding the COVID-19 Pandemic (e.g. social distancing) in an effort to limit this patient's exposure and mitigate transmission in our community.  Due to his co-morbid illnesses, this patient is at least at moderate risk for complications without adequate follow up.  This format is felt to be most appropriate for this patient at this time.  All issues noted in this document were discussed and addressed.  A limited physical exam was performed with this format.  Please refer to the patient's chart for his consent to telehealth for [facility].   Date:  04/02/2019   ID:  Casey Lynch, DOB Apr 04, 1946, MRN [id number]  Patient Location: Home Provider Location: Office  PCP:  Josetta Huddle, MD  Cardiologist:  Buford Dresser, MD  Electrophysiologist:  None   Evaluation Performed:  Follow-Up Visit  Chief Complaint:  Hand swelling  History of Present Illness:    Casey Lynch is a 73 y.o. male with CAD, chronic systolic and diastolic heart failure, hyperlipidemia, PAD (carotid bruit <50% bilaterally, left subclavian steal syndrome), COPD, former tobacco abuse, OSA on CPAP, type II diabetes on long term insulin.  The patient does not have symptoms concerning for COVID-19 infection (fever, chills, cough, or new shortness of breath).   Today:  Had upper endoscopy at [facility] last week, had two stents placed and polyps removed. Tolerated well. Per review of Care Everywhere, had ERCP and EUS with pancreatic ductal stent placement. He had many of his home medications held due to the procedure but is due to restart them tomorrow. He was also treated with ciprofloxacin.  A1c 7.1, losing weight on noom. Off glimeperide now.   Right hand still slightly swollen, not better with prednisone or antiinflammatories. Has upcoming appy with Dr. Inda Merlin. I looked at his hands via video, and there  does appear to be diffuse swelling worse on right vs. Left hand. No compartment syndrome symptoms.   Breathing is not bad, [place]lty [place] but walking 10k steps/day without issue. Feels like it is better than it was before.  Denies chest pain, shortness of breath at rest or with light exertion. No PND, orthopnea, LE edema or unexpected weight gain. No syncope or palpitations.  Past Medical History:  Diagnosis Date   Carotid bruit    Diabetes mellitus without complication (HCC)    Eczema    Heart failure ([place])    History of colonic polyps    Hypercholesterolemia    Hyperlipidemia    Macular degeneration    Osteoarthritis    Paresthesias    Pseudogout    Subclavian steal syndrome    Past Surgical History:  Procedure Laterality Date   CARDIAC CATHETERIZATION     COLONOSCOPY WITH PROPOFOL N/A 10/24/2016   Procedure: COLONOSCOPY WITH PROPOFOL;  Surgeon: Garlan Fair, MD;  Location: [facility];  Service: Endoscopy;  Laterality: N/A;   HYDROCELE EXCISION / REPAIR     KNEE SURGERY     REFRACTIVE SURGERY     TONSILLECTOMY       Current Meds  Medication Sig   aspirin 81 MG tablet Take 81 mg by mouth daily.   b complex vitamins capsule Take 1 capsule by mouth daily.   colchicine (COLCRYS) 0.6 MG tablet Take 0.6 mg by mouth daily.   metFORMIN (GLUCOPHAGE) 500 MG tablet Take 1,000 mg by mouth 2 (two) times daily.  metoprolol succinate (TOPROL-XL) 25 MG 24 hr tablet Take 1 tablet (25 mg total) by mouth daily.   pantoprazole (PROTONIX) 40 MG tablet TAKE 1 TABLET BY MOUTH 30 MINUTES BEFORE FIRST MEAL OF THE DAY   sacubitril-valsartan (ENTRESTO) 97-103 MG Take 1 tablet by mouth 2 (two) times daily.   triamcinolone cream (KENALOG) 0.1 % Apply 1 application topically 2 (two) times daily as needed (for dry skin.).     Allergies:   Statins and Tetanus toxoids   Social History   Tobacco Use   Smoking status: Former Smoker     Packs/day: 1.00    Years: 59.00    Pack years: 59.00    Types: Cigarettes    Quit date: 09/05/2016    Years since quitting: 2.5   Smokeless tobacco: Never Used  Substance Use Topics   Alcohol use: Yes    Alcohol/week: 0.0 standard drinks    Comment: Rare   Drug use: No     Family Hx: The patient's family history includes Bipolar disorder in his brother; Cancer in his paternal grandmother; Diabetes in his brother; Heart failure in his father.  ROS:   Please see the history of present illness.    Constitutional: Negative for chills, fever, night sweats, unintentional weight loss  HENT: Negative for ear pain and hearing loss.   Eyes: Negative for loss of vision and eye pain.  Respiratory: Negative for cough, sputum, wheezing.   Cardiovascular: See HPI. Gastrointestinal: Negative for abdominal pain, melena, and hematochezia.  Genitourinary: Negative for dysuria and hematuria.  Musculoskeletal: Negative for falls and myalgias.  Skin: Negative for itching and rash.  Neurological: Negative for focal weakness, focal sensory changes and loss of consciousness.  Endo/Heme/Allergies: Does not bruise/bleed easily.  All other systems reviewed and are negative.   Prior CV studies:   The following studies were reviewed today: Historical records re: cath and echo  Echo 02/19/19  1. The left ventricle has mild-moderately reduced systolic function, with an ejection fraction of 40-45%. The cavity size was normal. Left ventricular diastolic Doppler parameters are consistent with impaired relaxation. Left ventricular diffuse  hypokinesis, with slightly worse function in the lateral wall.  2. The right ventricle has normal systolic function. The cavity was normal. There is no increase in right ventricular wall thickness. Right ventricular systolic pressure could not be assessed.  3. The aortic valve is tricuspid. Aortic valve regurgitation is trivial by color flow Doppler. No stenosis of the aortic  valve.  4. Cannot exclude small PFO by color flow Doppler.  5. There is redundancy of the interatrial septum.  Labs/Other Tests and Data Reviewed:    EKG:  An ECG dated 07/12/18 was personally reviewed today and demonstrated:  NSR, RBBB  Recent Labs: 12/14/2018: ALT 27; BNP 18.9; BUN 22; Creatinine, Ser 1.27; Hemoglobin 12.8; Magnesium 1.8; Platelets 325; Potassium 5.0; Sodium 137   Recent Lipid Panel Lab Results  Component Value Date/Time   CHOL 143 12/14/2018 08:31 AM   TRIG 134 12/14/2018 08:31 AM   HDL 46 12/14/2018 08:31 AM   CHOLHDL 3.1 12/14/2018 08:31 AM   LDLCALC 70 12/14/2018 08:31 AM    Wt Readings from Last 3 Encounters:  04/02/19 170 lb 12.8 oz (77.5 kg)  02/18/19 177 lb 6.4 oz (80.5 kg)  12/11/18 181 lb (82.1 kg)    Down 16 lbs on hpme scale total  Objective:    Vital Signs:  Ht 5\' 6"  (1.676 m)    Wt 170 lb 12.8 oz (77.5  kg)    BMI 27.57 kg/m    VITAL SIGNS:  reviewed GEN:  no acute distress EYES:  sclerae anicteric, EOMI - Extraocular Movements Intact RESPIRATORY:  normal respiratory effort, symmetric expansion CARDIOVASCULAR:  no visible JVD SKIN:  no rash, lesions or ulcers. MUSCULOSKELETAL:  right hand appears mildly diffuse swollen as compared to left NEURO:  alert and oriented x 3, no obvious focal deficit PSYCH:  normal affect  ASSESSMENT & PLAN:    Shortness of breath: Now improving. Able to do light exertion, only notices with moderate exertion or greater -echo 02/19/19 with EF 40-45%, grade 1 DD. No significant valve disease. Cannot exclude PFO.  Right hand swelling: Persistent. Did not respond to prednisone or NSAID. No compartment syndrome symptoms. Not improved with elevation -plans to see Dr. Inda Merlin soon in person. Would do pulse check -has known subclavian steal syndrome on the left, no known disease on the right. If pulse is diminished on the right, would consider arterial dopplers. 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 -EF 42% on [place] records, here 40-45% -tolerating maximum dose entresto -tolerating metoprolol succinate 25 mg daily -has weight based lasix PRN, has not required -did not tolerate spironolactone per report -NYHA class II, shortness of breath as above  CAD (predominantly RCA, but diffuse mild disease) Hyperlipidemia, intolerant of atorvastatin -was tolerating rosuvastatin 5 mg. Meds stopped for ERCP.  -repeat lipids in 12/2018 note LDL of 70, much improved from 144. -LDL goal <70 -TG improved to 134 from 313. -could not afford vascepa at >$500, but TG now improved -discussed diet and exercise recommendations. Working on weight loss with Noom. -on aspirin 81 mg -avoid long term NSAIDs given CAD  Type II diabetes, on insulin -recent A1c 7.9 -did not tolerate SGLT2 inhibitor -consider GLP1-RA. As he is on insulin, will defer this to PCP, but would benefit from this from ASCVD risk standpoint  Cardiologist in [place]: Dr Newt Minion fax [phone]  COVID-19 Education: The signs and symptoms of COVID-19 were discussed with the patient and how to seek care for testing (follow up with PCP or arrange E-visit).  The importance of social distancing was discussed today.  Time:   Today, I have spent 17 minutes with the patient with telehealth technology discussing the above problems.     Medication Adjustments/Labs and Tests Ordered: Current medicines are reviewed at length with the patient today.  Concerns regarding medicines are outlined above.   Tests Ordered: No orders of the defined types were placed in this encounter.   Medication Changes: No orders of the defined types were placed in this encounter.   Follow Up:  4 mos with labs  Signed, Buford Dresser, MD  04/02/2019 7:07 PM    [place]

## 2019-04-02 NOTE — Patient Instructions (Signed)
Medication Instructions:  Your Physician recommend you continue on your current medication as directed.    If you need a refill on your cardiac medications before your next appointment, please call your pharmacy.   Lab work: None  Testing/Procedures: None  Follow-Up: At [facility], you and your health needs are our priority.  As part of our continuing mission to provide you with exceptional heart care, we have created designated Provider Care Teams.  These Care Teams include your primary Cardiologist (physician) and Advanced Practice Providers (APPs -  Physician Assistants and Nurse Practitioners) who all work together to provide you with the care you need, when you need it. You will need a follow up appointment in 4 years.  Please call our office 2 months in advance to schedule this appointment.  You may see Buford Dresser, MD or one of the following Advanced Practice Providers on your designated Care Team:   Rosaria Ferries, PA-C . Jory Sims, DNP, ANP

## 2019-04-02 NOTE — Telephone Encounter (Signed)
Called Pt twice to go over medications and vitals for virtual appt, Pt did not answer. Left two vm.

## 2019-04-02 NOTE — Telephone Encounter (Signed)
LVM for patient to call and schedule 4 month followup with Dr. Harrell Gave.

## 2019-04-02 NOTE — Telephone Encounter (Signed)
error 

## 2019-04-03 ENCOUNTER — Ambulatory Visit (INDEPENDENT_AMBULATORY_CARE_PROVIDER_SITE_OTHER)
Admission: RE | Admit: 2019-04-03 | Discharge: 2019-04-03 | Disposition: A | Discharge status: Home / Self Care | Payer: Medicare Other | Source: Ambulatory Visit | Attending: Acute Care | Admitting: Acute Care

## 2019-04-03 ENCOUNTER — Other Ambulatory Visit: Payer: Self-pay

## 2019-04-03 DIAGNOSIS — Z122 Encounter for screening for malignant neoplasm of respiratory organs: Secondary | ICD-10-CM

## 2019-04-03 DIAGNOSIS — Z87891 Personal history of nicotine dependence: Secondary | ICD-10-CM | POA: Diagnosis not present

## 2019-04-09 DIAGNOSIS — M25511 Pain in right shoulder: Secondary | ICD-10-CM | POA: Diagnosis not present

## 2019-04-09 DIAGNOSIS — K8689 Other specified diseases of pancreas: Secondary | ICD-10-CM | POA: Diagnosis not present

## 2019-04-12 ENCOUNTER — Telehealth: Payer: Self-pay | Admitting: Pulmonary Disease

## 2019-04-12 DIAGNOSIS — Z87891 Personal history of nicotine dependence: Secondary | ICD-10-CM

## 2019-04-12 DIAGNOSIS — Z122 Encounter for screening for malignant neoplasm of respiratory organs: Secondary | ICD-10-CM

## 2019-04-12 NOTE — Telephone Encounter (Signed)
Pt calling requesting results for CT Chest Lung Cancer Screen Low Dose.  Routing to [employer] RN who is with the lung cancer screening program for follow-up.

## 2019-04-12 NOTE — Telephone Encounter (Signed)
Pt informed of CT results per Sarah Groce, NP.  PT verbalized understanding.  Copy sent to PCP.  Order placed for 1 yr f/u CT.  

## 2019-04-24 DIAGNOSIS — M25512 Pain in left shoulder: Secondary | ICD-10-CM | POA: Diagnosis not present

## 2019-04-24 DIAGNOSIS — M4722 Other spondylosis with radiculopathy, cervical region: Secondary | ICD-10-CM | POA: Diagnosis not present

## 2019-04-24 DIAGNOSIS — M67911 Unspecified disorder of synovium and tendon, right shoulder: Secondary | ICD-10-CM | POA: Diagnosis not present

## 2019-04-29 ENCOUNTER — Ambulatory Visit (INDEPENDENT_AMBULATORY_CARE_PROVIDER_SITE_OTHER): Payer: Medicare Other

## 2019-04-29 ENCOUNTER — Other Ambulatory Visit: Payer: Self-pay

## 2019-04-29 ENCOUNTER — Telehealth: Payer: Self-pay | Admitting: Pulmonary Disease

## 2019-04-29 DIAGNOSIS — Z23 Encounter for immunization: Secondary | ICD-10-CM | POA: Diagnosis not present

## 2019-04-29 DIAGNOSIS — M67921 Unspecified disorder of synovium and tendon, right upper arm: Secondary | ICD-10-CM | POA: Diagnosis not present

## 2019-04-29 DIAGNOSIS — M67922 Unspecified disorder of synovium and tendon, left upper arm: Secondary | ICD-10-CM | POA: Diagnosis not present

## 2019-04-29 NOTE — Telephone Encounter (Signed)
Spoke with pt's wife, Olin Hauser. Pt has been scheduled for his flu shot today at 1405. Nothing further was needed.

## 2019-05-02 DIAGNOSIS — D132 Benign neoplasm of duodenum: Secondary | ICD-10-CM | POA: Diagnosis not present

## 2019-05-02 DIAGNOSIS — D135 Benign neoplasm of extrahepatic bile ducts: Secondary | ICD-10-CM | POA: Diagnosis not present

## 2019-05-02 DIAGNOSIS — Z01812 Encounter for preprocedural laboratory examination: Secondary | ICD-10-CM | POA: Diagnosis not present

## 2019-05-02 DIAGNOSIS — Z20828 Contact with and (suspected) exposure to other viral communicable diseases: Secondary | ICD-10-CM | POA: Diagnosis not present

## 2019-05-09 DIAGNOSIS — I272 Pulmonary hypertension, unspecified: Secondary | ICD-10-CM | POA: Diagnosis not present

## 2019-05-09 DIAGNOSIS — K317 Polyp of stomach and duodenum: Secondary | ICD-10-CM | POA: Diagnosis not present

## 2019-05-09 DIAGNOSIS — G4733 Obstructive sleep apnea (adult) (pediatric): Secondary | ICD-10-CM | POA: Diagnosis not present

## 2019-05-09 DIAGNOSIS — I509 Heart failure, unspecified: Secondary | ICD-10-CM | POA: Diagnosis not present

## 2019-05-09 DIAGNOSIS — E119 Type 2 diabetes mellitus without complications: Secondary | ICD-10-CM | POA: Diagnosis not present

## 2019-05-09 DIAGNOSIS — Z4689 Encounter for fitting and adjustment of other specified devices: Secondary | ICD-10-CM | POA: Diagnosis not present

## 2019-05-09 DIAGNOSIS — I255 Ischemic cardiomyopathy: Secondary | ICD-10-CM | POA: Diagnosis not present

## 2019-05-09 DIAGNOSIS — K219 Gastro-esophageal reflux disease without esophagitis: Secondary | ICD-10-CM | POA: Diagnosis not present

## 2019-05-09 DIAGNOSIS — I340 Nonrheumatic mitral (valve) insufficiency: Secondary | ICD-10-CM | POA: Diagnosis not present

## 2019-05-09 DIAGNOSIS — Z9889 Other specified postprocedural states: Secondary | ICD-10-CM | POA: Diagnosis not present

## 2019-05-09 DIAGNOSIS — I110 Hypertensive heart disease with heart failure: Secondary | ICD-10-CM | POA: Diagnosis not present

## 2019-05-09 DIAGNOSIS — D132 Benign neoplasm of duodenum: Secondary | ICD-10-CM | POA: Diagnosis not present

## 2019-05-09 DIAGNOSIS — D135 Benign neoplasm of extrahepatic bile ducts: Secondary | ICD-10-CM | POA: Diagnosis not present

## 2019-05-15 DIAGNOSIS — M67922 Unspecified disorder of synovium and tendon, left upper arm: Secondary | ICD-10-CM | POA: Diagnosis not present

## 2019-05-15 DIAGNOSIS — M67921 Unspecified disorder of synovium and tendon, right upper arm: Secondary | ICD-10-CM | POA: Diagnosis not present

## 2019-05-16 DIAGNOSIS — E119 Type 2 diabetes mellitus without complications: Secondary | ICD-10-CM | POA: Diagnosis not present

## 2019-05-21 NOTE — Telephone Encounter (Signed)
Mr. Feith,  You have moderate sleep apnea.  Hence we recommend treating it If you cannot tolerate the CPAP then another option would be to use dental devices which do not involve the mask and can be effective in your case If you are interested we can make a referral for this  Marshell Garfinkel MD [facility] 05/21/2019, 12:39 PM

## 2019-05-22 NOTE — Telephone Encounter (Signed)
Dr. Vaughan Browner please advise on email response below. Thank you!  "This message is being sent by Maree Erie on behalf of Casey Lynch  I have a mouthpiece that I have worn for many years. Is this sufficient for my moderate apnea?"

## 2019-05-27 DIAGNOSIS — M67921 Unspecified disorder of synovium and tendon, right upper arm: Secondary | ICD-10-CM | POA: Diagnosis not present

## 2019-06-11 DIAGNOSIS — H353131 Nonexudative age-related macular degeneration, bilateral, early dry stage: Secondary | ICD-10-CM | POA: Diagnosis not present

## 2019-06-11 DIAGNOSIS — H401111 Primary open-angle glaucoma, right eye, mild stage: Secondary | ICD-10-CM | POA: Diagnosis not present

## 2019-06-11 DIAGNOSIS — E119 Type 2 diabetes mellitus without complications: Secondary | ICD-10-CM | POA: Diagnosis not present

## 2019-06-11 DIAGNOSIS — H40022 Open angle with borderline findings, high risk, left eye: Secondary | ICD-10-CM | POA: Diagnosis not present

## 2019-07-03 DIAGNOSIS — E1140 Type 2 diabetes mellitus with diabetic neuropathy, unspecified: Secondary | ICD-10-CM | POA: Diagnosis not present

## 2019-07-03 DIAGNOSIS — I509 Heart failure, unspecified: Secondary | ICD-10-CM | POA: Diagnosis not present

## 2019-07-03 DIAGNOSIS — G458 Other transient cerebral ischemic attacks and related syndromes: Secondary | ICD-10-CM | POA: Diagnosis not present

## 2019-07-03 DIAGNOSIS — M1990 Unspecified osteoarthritis, unspecified site: Secondary | ICD-10-CM | POA: Diagnosis not present

## 2019-07-03 DIAGNOSIS — E1165 Type 2 diabetes mellitus with hyperglycemia: Secondary | ICD-10-CM | POA: Diagnosis not present

## 2019-07-03 DIAGNOSIS — E1342 Other specified diabetes mellitus with diabetic polyneuropathy: Secondary | ICD-10-CM | POA: Diagnosis not present

## 2019-07-03 DIAGNOSIS — I255 Ischemic cardiomyopathy: Secondary | ICD-10-CM | POA: Diagnosis not present

## 2019-07-03 DIAGNOSIS — E782 Mixed hyperlipidemia: Secondary | ICD-10-CM | POA: Diagnosis not present

## 2019-07-03 DIAGNOSIS — J449 Chronic obstructive pulmonary disease, unspecified: Secondary | ICD-10-CM | POA: Diagnosis not present

## 2019-07-03 DIAGNOSIS — I270 Primary pulmonary hypertension: Secondary | ICD-10-CM | POA: Diagnosis not present

## 2019-07-03 DIAGNOSIS — E7800 Pure hypercholesterolemia, unspecified: Secondary | ICD-10-CM | POA: Diagnosis not present

## 2019-07-03 DIAGNOSIS — M179 Osteoarthritis of knee, unspecified: Secondary | ICD-10-CM | POA: Diagnosis not present

## 2019-07-22 ENCOUNTER — Ambulatory Visit: Payer: Medicare Other | Admitting: Cardiology

## 2019-07-24 ENCOUNTER — Encounter: Payer: Self-pay | Admitting: Cardiology

## 2019-07-24 ENCOUNTER — Ambulatory Visit (INDEPENDENT_AMBULATORY_CARE_PROVIDER_SITE_OTHER): Payer: Medicare Other | Admitting: Cardiology

## 2019-07-24 ENCOUNTER — Other Ambulatory Visit: Payer: Self-pay

## 2019-07-24 VITALS — BP 104/58 | HR 67 | Temp 97.8°F | Ht 66.5 in | Wt 170.0 lb

## 2019-07-24 DIAGNOSIS — E782 Mixed hyperlipidemia: Secondary | ICD-10-CM

## 2019-07-24 DIAGNOSIS — I5042 Chronic combined systolic (congestive) and diastolic (congestive) heart failure: Secondary | ICD-10-CM | POA: Diagnosis not present

## 2019-07-24 DIAGNOSIS — I2583 Coronary atherosclerosis due to lipid rich plaque: Secondary | ICD-10-CM | POA: Diagnosis not present

## 2019-07-24 DIAGNOSIS — E1159 Type 2 diabetes mellitus with other circulatory complications: Secondary | ICD-10-CM

## 2019-07-24 DIAGNOSIS — Z789 Other specified health status: Secondary | ICD-10-CM

## 2019-07-24 DIAGNOSIS — I2510 Atherosclerotic heart disease of native coronary artery without angina pectoris: Secondary | ICD-10-CM

## 2019-07-24 DIAGNOSIS — Z79899 Other long term (current) drug therapy: Secondary | ICD-10-CM | POA: Diagnosis not present

## 2019-07-24 DIAGNOSIS — Z7189 Other specified counseling: Secondary | ICD-10-CM

## 2019-07-24 DIAGNOSIS — I255 Ischemic cardiomyopathy: Secondary | ICD-10-CM | POA: Diagnosis not present

## 2019-07-24 NOTE — Patient Instructions (Signed)
Medication Instructions:  Your Physician recommend you continue on your current medication as directed.    *If you need a refill on your cardiac medications before your next appointment, please call your pharmacy*  Lab Work: Your physician recommends that you return for lab work tomorrow 07/25/19 (fasting lipids)  If you have labs (blood work) drawn today and your tests are completely normal, you will receive your results only by: Marland Kitchen MyChart Message (if you have MyChart) OR . A paper copy in the mail If you have any lab test that is abnormal or we need to change your treatment, we will call you to review the results.  Testing/Procedures: None  Follow-Up: At [facility], you and your health needs are our priority.  As part of our continuing mission to provide you with exceptional heart care, we have created designated Provider Care Teams.  These Care Teams include your primary Cardiologist (physician) and Advanced Practice Providers (APPs -  Physician Assistants and Nurse Practitioners) who all work together to provide you with the care you need, when you need it.  Your next appointment:   5 month(s)  The format for your next appointment:   In Person  Provider:   Buford Dresser, MD

## 2019-07-24 NOTE — Progress Notes (Signed)
Cardiology Office Note:    Date:  07/24/2019   ID:  Casey Lynch, DOB [birth year], MRN [id number]  PCP:  Josetta Huddle, MD  Cardiologist:  Buford Dresser, MD  Referring MD: Josetta Huddle, MD   Chief Complaint  Patient presents with  . Follow-up    6 months.  . Headache    History of Present Illness:    Casey Lynch is a 73 y.o. male with a hx of with CAD, chronic systolic and diastolic heart failure, ischemic cardiomyopathy, hyperlipidemia, PAD (carotid bruit <50% bilaterally, left subclavian steal syndrome), COPD, former tobacco abuse, OSA on CPAP, type II diabetes. I met him 07/11/2018, and his initial cardiac evaluation from [place] is summarized in that note.  Today: Not sleeping well, reviewed his activity tracker results of this. Has tried melatonin in the past.   Dropped 26 lbs using Noom. Doing very well with this, congratulated. He was able to come off of insulin due to his weight loss.  Doing well with entresto, though it is expensive for him. Was >$400 for 90 days earlier , now $250 for 90 days  Mild shortness of breath, mostly with hills. Has not limited his daily activities.  Stopped crestor due to muscle aches. Has now been intolerant tolow doses of rosuvastatin, atorvastatin. Has been off 4-5 mos. With his known coronary disease and ischemic cardiomyopathy, he should be on a high intensity statin, but he has been unable to tolerate. Also trialed vascepa but was unable to fill prescription as cost was >$500.  Denies chest pain, shortness of breath at rest or with normal exertion. No PND, orthopnea, LE edema or unexpected weight gain. No syncope or palpitations.  Past Medical History:  Diagnosis Date  . Carotid bruit   . Diabetes mellitus without complication ([place])   . Eczema   . Heart failure ([place])   . History of colonic polyps   . Hypercholesterolemia   . Hyperlipidemia   . Macular degeneration   . Osteoarthritis   . Paresthesias   . Pseudogout    . Subclavian steal syndrome     Past Surgical History:  Procedure Laterality Date  . CARDIAC CATHETERIZATION    . COLONOSCOPY WITH PROPOFOL N/A 10/24/2016   Procedure: COLONOSCOPY WITH PROPOFOL;  Surgeon: Garlan Fair, MD;  Location: [facility];  Service: Endoscopy;  Laterality: N/A;  . HYDROCELE EXCISION / REPAIR    . KNEE SURGERY    . REFRACTIVE SURGERY    . TONSILLECTOMY      Current Medications: Current Outpatient Medications on File Prior to Visit  Medication Sig  . aspirin 81 MG tablet Take 81 mg by mouth daily.  Marland Kitchen b complex vitamins capsule Take 1 capsule by mouth daily.  . colchicine (COLCRYS) 0.6 MG tablet Take 0.6 mg by mouth daily.  . Glimepiride (AMARYL PO) Take 1 tablet by mouth every morning.  . metFORMIN (GLUCOPHAGE) 500 MG tablet Take 1,000 mg by mouth 2 (two) times daily.   . metoprolol succinate (TOPROL-XL) 25 MG 24 hr tablet Take 1 tablet (25 mg total) by mouth daily.  . naproxen sodium (ANAPROX) 220 MG tablet Take 220 mg by mouth 2 (two) times daily as needed (for pain.).  Marland Kitchen pantoprazole (PROTONIX) 40 MG tablet TAKE 1 TABLET BY MOUTH 30 MINUTES BEFORE FIRST MEAL OF THE DAY  . sacubitril-valsartan (ENTRESTO) 97-103 MG Take 1 tablet by mouth 2 (two) times daily.  Marland Kitchen triamcinolone cream (KENALOG) 0.1 % Apply 1 application topically 2 (two) times daily  as needed (for dry skin.).  . Turmeric 500 MG CAPS Take 1 capsule by mouth daily.   No current facility-administered medications on file prior to visit.      Allergies:   Statins and Tetanus toxoids   Social History   Tobacco Use  . Smoking status: Former Smoker    Packs/day: 1.00    Years: 59.00    Pack years: 59.00    Types: Cigarettes    Quit date: 09/05/2016    Years since quitting: 2.8  . Smokeless tobacco: Never Used  Substance Use Topics  . Alcohol use: Yes    Alcohol/week: 0.0 standard drinks    Comment: Rare  . Drug use: No    Family History: family history includes Bipolar disorder in  his brother; Cancer in his paternal grandmother; Diabetes in his brother; Heart failure in his father.  ROS:   Please see the history of present illness.  Additional pertinent ROS: Constitutional: Negative for chills, fever, night sweats, unintentional weight loss  HENT: Negative for ear pain and hearing loss.   Eyes: Negative for loss of vision and eye pain.  Respiratory: Negative for cough, sputum, wheezing.   Cardiovascular: See HPI. Gastrointestinal: Negative for abdominal pain, melena, and hematochezia.  Genitourinary: Negative for dysuria and hematuria.  Musculoskeletal: Negative for falls and myalgias.  Skin: Negative for itching and rash.  Neurological: Negative for focal weakness, focal sensory changes and loss of consciousness.  Endo/Heme/Allergies: Does not bruise/bleed easily.     EKGs/Labs/Other Studies Reviewed:    The following studies were reviewed today: Historical records re: cath and echo  Echo 02/19/19 1. The left ventricle has mild-moderately reduced systolic function, with an ejection fraction of 40-45%. The cavity size was normal. Left ventricular diastolic Doppler parameters are consistent with impaired relaxation. Left ventricular diffuse  hypokinesis, with slightly worse function in the lateral wall. 2. The right ventricle has normal systolic function. The cavity was normal. There is no increase in right ventricular wall thickness. Right ventricular systolic pressure could not be assessed. 3. The aortic valve is tricuspid. Aortic valve regurgitation is trivial by color flow Doppler. No stenosis of the aortic valve. 4. Cannot exclude small PFO by color flow Doppler. 5. There is redundancy of the interatrial septum.  EKG:  EKG is personally reviewed.  The ekg ordered today demonstrates NSR, RBBB  Recent Labs: 12/14/2018: ALT 27; BNP 18.9; BUN 22; Creatinine, Ser 1.27; Hemoglobin 12.8; Magnesium 1.8; Platelets 325; Potassium 5.0; Sodium 137  Recent Lipid  Panel    Component Value Date/Time   CHOL 143 12/14/2018 0831   TRIG 134 12/14/2018 0831   HDL 46 12/14/2018 0831   CHOLHDL 3.1 12/14/2018 0831   LDLCALC 70 12/14/2018 0831    Physical Exam:    VS:  BP (!) 104/58 (BP Location: Right Arm, Patient Position: Sitting, Cuff Size: Normal)   Pulse 67   Temp 97.8 F (36.6 C)   Ht 5' 6.5" (1.689 m)   Wt 170 lb (77.1 kg)   BMI 27.03 kg/m     Wt Readings from Last 3 Encounters:  07/24/19 170 lb (77.1 kg)  04/02/19 170 lb 12.8 oz (77.5 kg)  02/18/19 177 lb 6.4 oz (80.5 kg)    GEN: Well nourished, well developed in no acute distress HEENT: Normal, moist mucous membranes NECK: No JVD CARDIAC: regular rhythm, normal S1 and S2, no rubs or gallops. No murmurs. VASCULAR: Radial and DP pulses 2+ bilaterally. No carotid bruits RESPIRATORY:  Clear to auscultation  without rales, wheezing or rhonchi  ABDOMEN: Soft, non-tender, non-distended MUSCULOSKELETAL:  Ambulates independently SKIN: Warm and dry, no edema NEUROLOGIC:  Alert and oriented x 3. No focal neuro deficits noted. PSYCHIATRIC:  Normal affect    ASSESSMENT:    1. Coronary artery disease due to lipid rich plaque   2. Medication management   3. Chronic combined systolic and diastolic heart failure ([place])   4. Mixed hyperlipidemia   5. Statin intolerance   6. Type 2 diabetes mellitus with other circulatory complication, unspecified whether long term insulin use ([place])   7. Ischemic cardiomyopathy   8. Cardiac risk counseling   9. 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 Unfortunately, developed myalgias on rosuvastatin and had to stop several months ago -recheck fasting lipids off statin. If elevated, would have him see lipid clinic to discuss PCSK9i (preferably) vs alternates (ezetimibe, nexletol) -LDL goal <70 -could not afford vascepa at >$500, but TG may be better now given weight loss -on aspirin 81 mg -avoid long term NSAIDs given CAD  Type II diabetes, was on long term insulin and now off of this. -recentA1c 7.9 -did not tolerate SGLT2 inhibitor -consider GLP1-RA. -we discussed COORDINATE-DIABETES trial. He would be interested in this.  Cardiologist in [place]: Dr Newt Minion fax [phone]  Cardiac risk counseling and prevention recommendations: -recommend heart healthy/Mediterranean diet, with whole grains, fruits, vegetable, fish, lean meats, nuts, and olive oil. Limit salt. -recommend moderate walking, 3-5 times/week for 30-50 minutes each session. Aim for at least 150 minutes.week. Goal should be pace of 3 miles/hours, or walking 1.5 miles in 30 minutes -recommend avoidance of tobacco products. Avoid excess alcohol.  Plan for follow up: 5 mos, before he (hopefully) goes to [place] for the summer  Medication Adjustments/Labs and Tests Ordered: Current medicines are reviewed at length with the patient today.  Concerns regarding medicines are outlined above.  Orders Placed This Encounter  Procedures  . Lipid panel  . EKG 12-Lead   No orders of the defined types were placed in this encounter.   Patient Instructions  Medication Instructions:  Your Physician recommend you continue on your current medication as directed.    *If you need a refill on your cardiac medications before your next appointment, please call your pharmacy*  Lab Work: Your physician recommends that you return for lab work tomorrow 07/25/19  (fasting lipids)  If you have labs (blood work) drawn today and your tests are completely normal, you will receive your results only by: Marland Kitchen MyChart Message (if you have MyChart) OR . A paper copy in the mail If you have any lab test that is abnormal or we need to change your treatment, we will call you to review the results.  Testing/Procedures: None  Follow-Up: At [facility], you and your health needs are our priority.  As part of our continuing mission to provide you with exceptional heart care, we have created designated Provider Care Teams.  These Care Teams include your primary Cardiologist (physician) and Advanced Practice Providers (APPs -  Physician Assistants and Nurse Practitioners) who all work together to provide you with the care you need, when you need it.  Your next appointment:   5 month(s)  The format for your next appointment:   In Person  Provider:   Buford Dresser, MD     Signed, Buford Dresser, MD PhD 07/24/2019    [place]

## 2019-07-25 ENCOUNTER — Encounter: Payer: Self-pay | Admitting: Cardiology

## 2019-07-25 DIAGNOSIS — Z79899 Other long term (current) drug therapy: Secondary | ICD-10-CM | POA: Diagnosis not present

## 2019-07-25 DIAGNOSIS — I255 Ischemic cardiomyopathy: Secondary | ICD-10-CM | POA: Insufficient documentation

## 2019-07-25 LAB — LIPID PANEL
Chol/HDL Ratio: 6.2 ratio — ABNORMAL HIGH (ref 0.0–5.0)
Cholesterol, Total: 253 mg/dL — ABNORMAL HIGH (ref 100–199)
HDL: 41 mg/dL (ref >39)
LDL Chol Calc (NIH): 153 mg/dL — ABNORMAL HIGH (ref 0–99)
Triglycerides: 319 mg/dL — ABNORMAL HIGH (ref 0–149)
VLDL Cholesterol Cal: 59 mg/dL — ABNORMAL HIGH (ref 5–40)

## 2019-07-29 ENCOUNTER — Telehealth: Payer: Self-pay | Admitting: Cardiology

## 2019-07-29 ENCOUNTER — Encounter: Payer: Self-pay | Admitting: Adult Health

## 2019-07-29 NOTE — Telephone Encounter (Signed)
Left message for patient to call and schedule LIPID clinic appointment with the Pharm D

## 2019-07-29 NOTE — Telephone Encounter (Signed)
Error

## 2019-07-30 ENCOUNTER — Ambulatory Visit (INDEPENDENT_AMBULATORY_CARE_PROVIDER_SITE_OTHER): Payer: Medicare Other | Admitting: Pharmacist

## 2019-07-30 ENCOUNTER — Other Ambulatory Visit: Payer: Self-pay

## 2019-07-30 VITALS — BP 122/60 | HR 72

## 2019-07-30 DIAGNOSIS — E782 Mixed hyperlipidemia: Secondary | ICD-10-CM | POA: Diagnosis not present

## 2019-07-30 DIAGNOSIS — Z789 Other specified health status: Secondary | ICD-10-CM | POA: Diagnosis not present

## 2019-07-30 DIAGNOSIS — I255 Ischemic cardiomyopathy: Secondary | ICD-10-CM | POA: Diagnosis not present

## 2019-07-30 MED ORDER — ROSUVASTATIN CALCIUM 5 MG PO TABS: ORAL_TABLET | ORAL | 1 refills | Status: DC

## 2019-07-30 NOTE — Patient Instructions (Addendum)
Lipid Clinic (pharmacist) Casey Lynch/Kristin [phone]  *START taking Crestor (rosuvastatin) 5mg  Mondays, Wednesdays, and Fridays for 2 weeks, then increase to 5mg  Monday to Friday*  *Will repeat fasting Lipid panel in  2 months if able to tolerate*  *Call back if unable to tolerate to change therapy to [facility] at [phone] for financial assistance if unable to afford co-pay for cholesterol medication*   High Cholesterol  High cholesterol is a condition in which the blood has high levels of a white, waxy, fat-like substance (cholesterol). The human body needs small amounts of cholesterol. The liver makes all the cholesterol that the body needs. Extra (excess) cholesterol comes from the food that we eat. Cholesterol is carried from the liver by the blood through the blood vessels. If you have high cholesterol, deposits (plaques) may build up on the walls of your blood vessels (arteries). Plaques make the arteries narrower and stiffer. Cholesterol plaques increase your risk for heart attack and stroke. Work with your health care provider to keep your cholesterol levels in a healthy range. What increases the risk? This condition is more likely to develop in people who:  Eat foods that are high in animal fat (saturated fat) or cholesterol.  Are overweight.  Are not getting enough exercise.  Have a family history of high cholesterol. What are the signs or symptoms? There are no symptoms of this condition. How is this diagnosed? This condition may be diagnosed from the results of a blood test.  If you are older than age 54, your health care provider may check your cholesterol every 4-6 years.  You may be checked more often if you already have high cholesterol or other risk factors for heart disease. The blood test for cholesterol measures:  "Bad" cholesterol (LDL cholesterol). This is the main type of cholesterol that causes heart disease.  The desired level for LDL is less than 100.  "Good" cholesterol (HDL cholesterol). This type helps to protect against heart disease by cleaning the arteries and carrying the LDL away. The desired level for HDL is 60 or higher.  Triglycerides. These are fats that the body can store or burn for energy. The desired number for triglycerides is lower than 150.  Total cholesterol. This is a measure of the total amount of cholesterol in your blood, including LDL cholesterol, HDL cholesterol, and triglycerides. A healthy number is less than 200. How is this treated? This condition is treated with diet changes, lifestyle changes, and medicines. Diet changes  This may include eating more whole grains, fruits, vegetables, nuts, and fish.  This may also include cutting back on red meat and foods that have a lot of added sugar. Lifestyle changes  Changes may include getting at least 40 minutes of aerobic exercise 3 times a week. Aerobic exercises include walking, biking, and swimming. Aerobic exercise along with a healthy diet can help you maintain a healthy weight.  Changes may also include quitting smoking. Medicines  Medicines are usually given if diet and lifestyle changes have failed to reduce your cholesterol to healthy levels.  Your health care provider may prescribe a statin medicine. Statin medicines have been shown to reduce cholesterol, which can reduce the risk of heart disease. Follow these instructions at home: Eating and drinking If told by your health care provider:  Eat chicken (without skin), fish, veal, shellfish, ground [place] breast, and round or loin cuts of red meat.  Do not eat fried foods or fatty meats, such as hot dogs and  salami.  Eat plenty of fruits, such as apples.  Eat plenty of vegetables, such as broccoli, potatoes, and carrots.  Eat beans, peas, and lentils.  Eat grains such as barley, rice, couscous, and bulgur wheat.  Eat pasta without cream sauces.   Use skim or nonfat milk, and eat low-fat or nonfat yogurt and cheeses.  Do not eat or drink whole milk, cream, ice cream, egg yolks, or hard cheeses.  Do not eat stick margarine or tub margarines that contain trans fats (also called partially hydrogenated oils).  Do not eat saturated tropical oils, such as coconut oil and palm oil.  Do not eat cakes, cookies, crackers, or other baked goods that contain trans fats.  General instructions  Exercise as directed by your health care provider. Increase your activity level with activities such as gardening, walking, and taking the stairs.  Take over-the-counter and prescription medicines only as told by your health care provider.  Do not use any products that contain nicotine or tobacco, such as cigarettes and e-cigarettes. If you need help quitting, ask your health care provider.  Keep all follow-up visits as told by your health care provider. This is important. Contact a health care provider if:  You are struggling to maintain a healthy diet or weight.  You need help to start on an exercise program.  You need help to stop smoking. Get help right away if:  You have chest pain.  You have trouble breathing. This information is not intended to replace advice given to you by your health care provider. Make sure you discuss any questions you have with your health care provider. Document Released: 08/22/2005 Document Revised: 08/25/2017 Document Reviewed: 02/20/2016 Elsevier Patient Education  2020 [employer].

## 2019-07-30 NOTE — Progress Notes (Signed)
Patient ID: Casey Lynch                 DOB: 1946/01/23                    MRN: [id number]     HPI:  Casey Lynch is a 73 y.o. male patient referred to lipid clinic by Dr Harrell Gave. PMH is significant for HF, ischemic cardiomyopathy, hyperlipidemia, COPD, PAD, OSA on CPAP,and DM. He presents to day for initiate evaluation and potential start of PCSK9i therapy. Noted documented intolerance to statins in the past.  Current Medications: none  Intolerances:  Rosuvastatin - joint pain Atorvastatin Vascepa - too expensive   LDL goal: 70mg /dL or 50% drop from baseline value  Diet: balanced, low fat  Family History: family history includes Bipolar disorder in his brother; Cancer in his paternal grandmother; Diabetes in his brother; Heart failure in his father.  Social History: former smoker, rare alcohol use  Labs: 07/25/2019: CHO 253, TG 319, HDL 41, LDL-c 153 (no medication)  Past Medical History:  Diagnosis Date  . Carotid bruit   . Diabetes mellitus without complication ([place])   . Eczema   . Heart failure ([place])   . History of colonic polyps   . Hypercholesterolemia   . Hyperlipidemia   . Macular degeneration   . Osteoarthritis   . Paresthesias   . Pseudogout   . Subclavian steal syndrome     Current Outpatient Medications on File Prior to Visit  Medication Sig Dispense Refill  . aspirin 81 MG tablet Take 81 mg by mouth daily.    Marland Kitchen b complex vitamins capsule Take 1 capsule by mouth daily.    . colchicine (COLCRYS) 0.6 MG tablet Take 0.6 mg by mouth daily.    . Glimepiride (AMARYL PO) Take 1 tablet by mouth every morning.    . metFORMIN (GLUCOPHAGE) 500 MG tablet Take 1,000 mg by mouth 2 (two) times daily.     . metoprolol succinate (TOPROL-XL) 25 MG 24 hr tablet Take 1 tablet (25 mg total) by mouth daily. 90 tablet 3  . naproxen sodium (ANAPROX) 220 MG tablet Take 220 mg by mouth 2 (two) times daily as needed (for pain.).    Marland Kitchen pantoprazole (PROTONIX) 40 MG tablet TAKE  1 TABLET BY MOUTH 30 MINUTES BEFORE FIRST MEAL OF THE DAY    . sacubitril-valsartan (ENTRESTO) 97-103 MG Take 1 tablet by mouth 2 (two) times daily. 180 tablet 3  . triamcinolone cream (KENALOG) 0.1 % Apply 1 application topically 2 (two) times daily as needed (for dry skin.).    . Turmeric 500 MG CAPS Take 1 capsule by mouth daily.     No current facility-administered medications on file prior to visit.     Allergies  Allergen Reactions  . Statins Other (See Comments)    Terrible joint pain  . Tetanus Toxoids Other (See Comments)    Fever 104/105 & arm stiffiness    Mixed hyperlipidemia LDL remains above goa for secondary prevention. Patient developed muscle pain with atorvastatin and rosuvastatin in the past but report join pain not really changed since stopping rosuvastatin. We discussed Praluent/Repatha, Nexletol and low dose stating therapy. He will prefer a low rosuvastatin approach for now and re-assessment in 2 months. Mr Howden is willing to start PCSKi therapy if low dose rosuvastatin worsen his pain or is no able to bring LDL to goal.  Will start rosuvastatin 5mg  every Monday, Wednesday and Friday for 2 weeks,  then increase to 5mg  from Monday to Friday (hold Saturdays and Sundays). Plan to repeat fastin LDL profile in 2 months. Will start  Repatha/Praluent therapy if unable to reach LDL with low dose rosuvastatin.     Casey Lynch PharmD, BCPS, [place] [address] [place] [address] 08/07/2019 1:35 PM

## 2019-08-07 ENCOUNTER — Encounter: Payer: Self-pay | Admitting: Pharmacist

## 2019-08-07 NOTE — Assessment & Plan Note (Signed)
LDL remains above goa for secondary prevention. Patient developed muscle pain with atorvastatin and rosuvastatin in the past but report join pain not really changed since stopping rosuvastatin. We discussed Praluent/Repatha, Nexletol and low dose stating therapy. He will prefer a low rosuvastatin approach for now and re-assessment in 2 months. Casey Lynch is willing to start PCSKi therapy if low dose rosuvastatin worsen his pain or is no able to bring LDL to goal.  Will start rosuvastatin 5mg  every Monday, Wednesday and Friday for 2 weeks, then increase to 5mg  from Monday to Friday (hold Saturdays and Sundays). Plan to repeat fastin LDL profile in 2 months. Will start  Repatha/Praluent therapy if unable to reach LDL with low dose rosuvastatin.

## 2019-08-08 DIAGNOSIS — M1120 Other chondrocalcinosis, unspecified site: Secondary | ICD-10-CM | POA: Diagnosis not present

## 2019-08-08 DIAGNOSIS — M2550 Pain in unspecified joint: Secondary | ICD-10-CM | POA: Diagnosis not present

## 2019-08-08 DIAGNOSIS — Z79899 Other long term (current) drug therapy: Secondary | ICD-10-CM | POA: Diagnosis not present

## 2019-08-08 DIAGNOSIS — M150 Primary generalized (osteo)arthritis: Secondary | ICD-10-CM | POA: Diagnosis not present

## 2019-08-12 DIAGNOSIS — K5730 Diverticulosis of large intestine without perforation or abscess without bleeding: Secondary | ICD-10-CM | POA: Diagnosis not present

## 2019-08-12 DIAGNOSIS — K3189 Other diseases of stomach and duodenum: Secondary | ICD-10-CM | POA: Diagnosis not present

## 2019-08-12 DIAGNOSIS — D135 Benign neoplasm of extrahepatic bile ducts: Secondary | ICD-10-CM | POA: Diagnosis not present

## 2019-08-12 DIAGNOSIS — K648 Other hemorrhoids: Secondary | ICD-10-CM | POA: Diagnosis not present

## 2019-08-12 DIAGNOSIS — Z800 Family history of malignant neoplasm of digestive organs: Secondary | ICD-10-CM | POA: Diagnosis not present

## 2019-08-12 DIAGNOSIS — K838 Other specified diseases of biliary tract: Secondary | ICD-10-CM | POA: Diagnosis not present

## 2019-08-12 DIAGNOSIS — Z1211 Encounter for screening for malignant neoplasm of colon: Secondary | ICD-10-CM | POA: Diagnosis not present

## 2019-08-12 DIAGNOSIS — D132 Benign neoplasm of duodenum: Secondary | ICD-10-CM | POA: Diagnosis not present

## 2019-08-12 DIAGNOSIS — D122 Benign neoplasm of ascending colon: Secondary | ICD-10-CM | POA: Diagnosis not present

## 2019-08-12 DIAGNOSIS — K8050 Calculus of bile duct without cholangitis or cholecystitis without obstruction: Secondary | ICD-10-CM | POA: Diagnosis not present

## 2019-08-21 ENCOUNTER — Other Ambulatory Visit: Payer: Self-pay | Admitting: Cardiology

## 2019-08-23 ENCOUNTER — Telehealth: Payer: Self-pay | Admitting: Pharmacist Clinician (PhC)/ Clinical Pharmacy Specialist

## 2019-08-23 NOTE — Telephone Encounter (Signed)
Patient called to report that his most recent attempt with statin has failed.  Was feeling stiff and sore.  Has discontinued rosuvastatin about 5 days ago and now feeling much better.  Would like to try Repatha.  He will be getting new insurance on January 1 - [employer] Medicare.    I will have Haleigh reach out to him in the week after Christmas, to get his card information.  She will then be able to process the claim when we come back on January 4 after the holiday weekend.   Patient agreeable to this plan.

## 2019-09-02 ENCOUNTER — Telehealth: Payer: Self-pay

## 2019-09-02 NOTE — Telephone Encounter (Signed)
-----   Message from Rockne Menghini, [place] sent at 08/23/2019  4:27 PM EST ----- [place]  He is expecting your call this week to get his new insurance information for 2021.  He will need to start Repatha [id number] mg [employer] .  Casey Lynch

## 2019-09-02 NOTE — Telephone Encounter (Signed)
CALLED AND RECEIVED THE NEW INSURANCE CARD INFORMATION IS AS FOLLOWS: [id number]-[id number]

## 2019-09-25 DIAGNOSIS — E1165 Type 2 diabetes mellitus with hyperglycemia: Secondary | ICD-10-CM | POA: Diagnosis not present

## 2019-09-25 DIAGNOSIS — M1990 Unspecified osteoarthritis, unspecified site: Secondary | ICD-10-CM | POA: Diagnosis not present

## 2019-09-25 DIAGNOSIS — J449 Chronic obstructive pulmonary disease, unspecified: Secondary | ICD-10-CM | POA: Diagnosis not present

## 2019-09-25 DIAGNOSIS — E782 Mixed hyperlipidemia: Secondary | ICD-10-CM | POA: Diagnosis not present

## 2019-10-02 NOTE — Telephone Encounter (Signed)
Need prior authorization for Repatha 140mg  every 14 days Please call patient when available.

## 2019-10-02 NOTE — Addendum Note (Signed)
Addended by: Harrington Challenger on: 10/02/2019 05:02 PM   Modules accepted: Orders

## 2019-10-03 NOTE — Telephone Encounter (Signed)
PA SUBMITTED TODAY FOR REPATHA

## 2019-10-03 NOTE — Telephone Encounter (Signed)
CALLED AND LEFT A MSG THAT we do not have the most current insurance on file and also informed the pt that I contacted their pharmacy and they do not have any current information either. Instructed the pt to call us back with that information as soon as possible.

## 2019-10-03 NOTE — Telephone Encounter (Signed)
[id number] [id number] [id number]

## 2019-10-04 ENCOUNTER — Telehealth: Payer: Self-pay | Admitting: Cardiology

## 2019-10-04 MED ORDER — REPATHA SURECLICK 140 MG/ML ~~LOC~~ SOAJ: 140.0000 mg | SUBCUTANEOUS | 11 refills | Status: DC

## 2019-10-04 NOTE — Telephone Encounter (Signed)
Patient called and wanted to speak with one of the members of the Pharmacy team. The patient did not specify the reason for his call, he just would like the Pharmacy to call him back.

## 2019-10-04 NOTE — Telephone Encounter (Signed)
PA for repatha approved by insurance. RX sent to prefer pharmacy.  Patient was instructed to call if unable to afford medication.

## 2019-10-08 NOTE — Telephone Encounter (Signed)
Returned a call to the pt and explained that the needles are in the pen and it works similar to an [place]. The pt voiced understanding and will call back if they have further questions

## 2019-10-08 NOTE — Telephone Encounter (Signed)
Patient called back and states he is still having difficulty affording the new medication. He would like to speak with a Pharmacist again to discuss his options. Please call the patient's wife [name] at [phone] if the patient himself does not answer

## 2019-10-08 NOTE — Telephone Encounter (Signed)
Follow up   Patient's wife states that need a separate prescription for the needles for repatha. Please contact the patient to discuss.

## 2019-10-08 NOTE — Telephone Encounter (Signed)
Called and spoke w/pt about being unable to afford the medication. I was able to get the pt approved for the [employer] and emailed the confirmation to the pt. Pt voiced understanding.

## 2019-10-14 ENCOUNTER — Ambulatory Visit: Payer: Medicare Other

## 2019-10-16 DIAGNOSIS — M659 Synovitis and tenosynovitis, unspecified: Secondary | ICD-10-CM | POA: Diagnosis not present

## 2019-10-28 DIAGNOSIS — H524 Presbyopia: Secondary | ICD-10-CM | POA: Diagnosis not present

## 2019-10-29 DIAGNOSIS — M2550 Pain in unspecified joint: Secondary | ICD-10-CM | POA: Diagnosis not present

## 2019-10-29 DIAGNOSIS — M064 Inflammatory polyarthropathy: Secondary | ICD-10-CM | POA: Diagnosis not present

## 2019-10-29 DIAGNOSIS — M1120 Other chondrocalcinosis, unspecified site: Secondary | ICD-10-CM | POA: Diagnosis not present

## 2019-10-29 DIAGNOSIS — M150 Primary generalized (osteo)arthritis: Secondary | ICD-10-CM | POA: Diagnosis not present

## 2019-10-31 DIAGNOSIS — Z01812 Encounter for preprocedural laboratory examination: Secondary | ICD-10-CM | POA: Diagnosis not present

## 2019-10-31 DIAGNOSIS — J449 Chronic obstructive pulmonary disease, unspecified: Secondary | ICD-10-CM | POA: Diagnosis not present

## 2019-10-31 DIAGNOSIS — E1165 Type 2 diabetes mellitus with hyperglycemia: Secondary | ICD-10-CM | POA: Diagnosis not present

## 2019-10-31 DIAGNOSIS — Z20822 Contact with and (suspected) exposure to covid-19: Secondary | ICD-10-CM | POA: Diagnosis not present

## 2019-10-31 DIAGNOSIS — E782 Mixed hyperlipidemia: Secondary | ICD-10-CM | POA: Diagnosis not present

## 2019-10-31 DIAGNOSIS — M1990 Unspecified osteoarthritis, unspecified site: Secondary | ICD-10-CM | POA: Diagnosis not present

## 2019-11-01 ENCOUNTER — Ambulatory Visit: Payer: Medicare Other

## 2019-11-06 DIAGNOSIS — G4733 Obstructive sleep apnea (adult) (pediatric): Secondary | ICD-10-CM | POA: Diagnosis not present

## 2019-11-07 DIAGNOSIS — K838 Other specified diseases of biliary tract: Secondary | ICD-10-CM | POA: Diagnosis not present

## 2019-11-07 DIAGNOSIS — D135 Benign neoplasm of extrahepatic bile ducts: Secondary | ICD-10-CM | POA: Diagnosis not present

## 2019-11-07 DIAGNOSIS — E119 Type 2 diabetes mellitus without complications: Secondary | ICD-10-CM | POA: Diagnosis not present

## 2019-11-07 DIAGNOSIS — Z794 Long term (current) use of insulin: Secondary | ICD-10-CM | POA: Diagnosis not present

## 2019-11-07 DIAGNOSIS — D132 Benign neoplasm of duodenum: Secondary | ICD-10-CM | POA: Diagnosis not present

## 2019-11-07 DIAGNOSIS — K2980 Duodenitis without bleeding: Secondary | ICD-10-CM | POA: Diagnosis not present

## 2019-11-12 DIAGNOSIS — M539 Dorsopathy, unspecified: Secondary | ICD-10-CM | POA: Diagnosis not present

## 2019-11-19 DIAGNOSIS — E782 Mixed hyperlipidemia: Secondary | ICD-10-CM | POA: Diagnosis not present

## 2019-11-19 DIAGNOSIS — M1990 Unspecified osteoarthritis, unspecified site: Secondary | ICD-10-CM | POA: Diagnosis not present

## 2019-11-19 DIAGNOSIS — E1165 Type 2 diabetes mellitus with hyperglycemia: Secondary | ICD-10-CM | POA: Diagnosis not present

## 2019-11-19 DIAGNOSIS — J449 Chronic obstructive pulmonary disease, unspecified: Secondary | ICD-10-CM | POA: Diagnosis not present

## 2019-11-29 DIAGNOSIS — M1120 Other chondrocalcinosis, unspecified site: Secondary | ICD-10-CM | POA: Diagnosis not present

## 2019-11-29 DIAGNOSIS — M064 Inflammatory polyarthropathy: Secondary | ICD-10-CM | POA: Diagnosis not present

## 2019-11-29 DIAGNOSIS — M150 Primary generalized (osteo)arthritis: Secondary | ICD-10-CM | POA: Diagnosis not present

## 2019-11-29 DIAGNOSIS — M2550 Pain in unspecified joint: Secondary | ICD-10-CM | POA: Diagnosis not present

## 2019-12-02 DIAGNOSIS — J449 Chronic obstructive pulmonary disease, unspecified: Secondary | ICD-10-CM | POA: Diagnosis not present

## 2019-12-02 DIAGNOSIS — I255 Ischemic cardiomyopathy: Secondary | ICD-10-CM | POA: Diagnosis not present

## 2019-12-02 DIAGNOSIS — E7800 Pure hypercholesterolemia, unspecified: Secondary | ICD-10-CM | POA: Diagnosis not present

## 2019-12-02 DIAGNOSIS — G458 Other transient cerebral ischemic attacks and related syndromes: Secondary | ICD-10-CM | POA: Diagnosis not present

## 2019-12-10 DIAGNOSIS — H40022 Open angle with borderline findings, high risk, left eye: Secondary | ICD-10-CM | POA: Diagnosis not present

## 2019-12-10 DIAGNOSIS — H401111 Primary open-angle glaucoma, right eye, mild stage: Secondary | ICD-10-CM | POA: Diagnosis not present

## 2019-12-23 ENCOUNTER — Telehealth: Payer: Self-pay | Admitting: Cardiology

## 2019-12-23 DIAGNOSIS — L812 Freckles: Secondary | ICD-10-CM | POA: Diagnosis not present

## 2019-12-23 DIAGNOSIS — L821 Other seborrheic keratosis: Secondary | ICD-10-CM | POA: Diagnosis not present

## 2019-12-23 DIAGNOSIS — L570 Actinic keratosis: Secondary | ICD-10-CM | POA: Diagnosis not present

## 2019-12-23 DIAGNOSIS — E1159 Type 2 diabetes mellitus with other circulatory complications: Secondary | ICD-10-CM

## 2019-12-23 DIAGNOSIS — D225 Melanocytic nevi of trunk: Secondary | ICD-10-CM | POA: Diagnosis not present

## 2019-12-23 DIAGNOSIS — Z79899 Other long term (current) drug therapy: Secondary | ICD-10-CM

## 2019-12-23 NOTE — Telephone Encounter (Signed)
[name] and Jenny Reichmann are calling requesting any labs Dr. Harrell Gave would like for Casey Lynch to have performed be put in so Pratheek can come in prior to the appointment to have them done to discuss at his appointment. I advised Jenny Reichmann and [name] that Dr. Harrell Gave has an active request in the system for him to have HgB A1c, but they both stated they feel a BMET is necessary as well as whatever Dr. Harrell Gave advises so all bases are covered to discuss at the appointment. Please advise.

## 2019-12-23 NOTE — Telephone Encounter (Signed)
Please advise on labs to be completed prior to 12/26/19 appointment

## 2019-12-23 NOTE — Telephone Encounter (Signed)
The patient's wife has been made aware that we would call her back as soon as we heard from Dr. Harrell Gave.

## 2019-12-23 NOTE — Telephone Encounter (Signed)
Patient calling back.   °

## 2019-12-24 DIAGNOSIS — Z79899 Other long term (current) drug therapy: Secondary | ICD-10-CM | POA: Diagnosis not present

## 2019-12-24 DIAGNOSIS — E1159 Type 2 diabetes mellitus with other circulatory complications: Secondary | ICD-10-CM | POA: Diagnosis not present

## 2019-12-24 NOTE — Telephone Encounter (Signed)
Nurse advised wife of MD's recommendations. Wife verbalized understanding. Lab orders placed.

## 2019-12-24 NOTE — Telephone Encounter (Signed)
He will need lipid panel, BMET, and A1c. Thank you!

## 2019-12-25 LAB — LIPID PANEL
Chol/HDL Ratio: 2.5 ratio (ref 0.0–5.0)
Cholesterol, Total: 120 mg/dL (ref 100–199)
HDL: 48 mg/dL (ref >39)
LDL Chol Calc (NIH): 31 mg/dL (ref 0–99)
Triglycerides: 277 mg/dL — ABNORMAL HIGH (ref 0–149)
VLDL Cholesterol Cal: 41 mg/dL — ABNORMAL HIGH (ref 5–40)

## 2019-12-25 LAB — HEMOGLOBIN A1C
Est. average glucose Bld gHb Est-mCnc: 160 mg/dL
Hgb A1c MFr Bld: 7.2 % — ABNORMAL HIGH (ref 4.8–5.6)

## 2019-12-25 LAB — BASIC METABOLIC PANEL
BUN/Creatinine Ratio: 18 (ref 10–24)
BUN: 22 mg/dL (ref 8–27)
CO2: 23 mmol/L (ref 20–29)
Calcium: 10.3 mg/dL — ABNORMAL HIGH (ref 8.6–10.2)
Chloride: 98 mmol/L (ref 96–106)
Creatinine, Ser: 1.22 mg/dL (ref 0.76–1.27)
GFR calc Af Amer: 67 mL/min/{1.73_m2} (ref >59)
GFR calc non Af Amer: 58 mL/min/{1.73_m2} — ABNORMAL LOW (ref >59)
Glucose: 164 mg/dL — ABNORMAL HIGH (ref 65–99)
Potassium: 5.4 mmol/L — ABNORMAL HIGH (ref 3.5–5.2)
Sodium: 138 mmol/L (ref 134–144)

## 2019-12-26 ENCOUNTER — Other Ambulatory Visit: Payer: Self-pay

## 2019-12-26 ENCOUNTER — Encounter: Payer: Self-pay | Admitting: Cardiology

## 2019-12-26 ENCOUNTER — Ambulatory Visit: Payer: Medicare Other | Admitting: Cardiology

## 2019-12-26 VITALS — BP 124/68 | HR 77 | Temp 97.1°F | Ht 66.5 in | Wt 171.0 lb

## 2019-12-26 DIAGNOSIS — I2510 Atherosclerotic heart disease of native coronary artery without angina pectoris: Secondary | ICD-10-CM

## 2019-12-26 DIAGNOSIS — I5042 Chronic combined systolic (congestive) and diastolic (congestive) heart failure: Secondary | ICD-10-CM

## 2019-12-26 DIAGNOSIS — Z789 Other specified health status: Secondary | ICD-10-CM

## 2019-12-26 DIAGNOSIS — I2583 Coronary atherosclerosis due to lipid rich plaque: Secondary | ICD-10-CM

## 2019-12-26 DIAGNOSIS — E1159 Type 2 diabetes mellitus with other circulatory complications: Secondary | ICD-10-CM

## 2019-12-26 DIAGNOSIS — I255 Ischemic cardiomyopathy: Secondary | ICD-10-CM

## 2019-12-26 DIAGNOSIS — E782 Mixed hyperlipidemia: Secondary | ICD-10-CM

## 2019-12-26 DIAGNOSIS — Z7189 Other specified counseling: Secondary | ICD-10-CM

## 2019-12-26 NOTE — Progress Notes (Signed)
Cardiology Office Note:    Date:  12/26/2019   ID:  Casey Lynch, DOB 1945/10/11, MRN [id number]  PCP:  Josetta Huddle, MD  Cardiologist:  Buford Dresser, MD  Referring MD: Josetta Huddle, MD   CC: follow up  History of Present Illness:    Casey Lynch is a 74 y.o. male with a hx of with CAD, chronic systolic and diastolic heart failure, ischemic cardiomyopathy, hyperlipidemia, PAD (carotid bruit <50% bilaterally, left subclavian steal syndrome), COPD, former tobacco abuse, OSA on CPAP, type II diabetes. I met him 07/11/2018, and his initial cardiac evaluation from [place] is summarized in that note.  Today: Tolerating repatha well, given his statin intolerance and known CAD. Tolerating entresto, though expensive. We reviewed his recent labs, LDL excellent at 31. TG still elevated at 277, but vascepa very expensive. A1c 7.2, coming down.   Overall doing well. Rare tightness in upper chest, only with activity. Lasts 3-4 minutes, goes away on his own. Mild, does not have to stop walking when it comes on. Mild shortness of breath with more moderate activity, such as climbing hills [place] the golf [place].  Weight going down, still doing Noom. No LE edema, no PND or orthopnea. No syncope.  He is not sure if/when he will make it back up to [place] given Covid and border closure. [place], [place]  Past Medical History:  Diagnosis Date  . Carotid bruit   . Diabetes mellitus without complication ([place])   . Eczema   . Heart failure ([place])   . History of colonic polyps   . Hypercholesterolemia   . Hyperlipidemia   . Macular degeneration   . Osteoarthritis   . Paresthesias   . Pseudogout   . Subclavian steal syndrome     Past Surgical History:  Procedure Laterality Date  . CARDIAC CATHETERIZATION    . COLONOSCOPY WITH PROPOFOL N/A 10/24/2016   Procedure: COLONOSCOPY WITH PROPOFOL;  Surgeon: Garlan Fair,  MD;  Location: [facility];  Service: Endoscopy;  Laterality: N/A;  . HYDROCELE EXCISION / REPAIR    . KNEE SURGERY    . REFRACTIVE SURGERY    . TONSILLECTOMY      Current Medications: Current Outpatient Medications on File Prior to Visit  Medication Sig  . aspirin 81 MG chewable tablet Chew by mouth.  . B Complex Vitamins (VITAMIN B COMPLEX) TABS Take by mouth.  . Cholecalciferol 25 MCG (1000 UT) tablet Take by mouth.  . colchicine (COLCRYS) 0.6 MG tablet Take 0.6 mg by mouth daily.  . CONTOUR NEXT TEST test strip USE TO CHECK BLOOD SUGAR TWICE DAILY AS DIRECTED  . Evolocumab (REPATHA SURECLICK) 564 MG/ML SOAJ Inject into the skin.  . furosemide (LASIX) 20 MG tablet Take by mouth.  Marland Kitchen glimepiride (AMARYL) 4 MG tablet Take 8 mg by mouth every morning.  . hydroxychloroquine (PLAQUENIL) 200 MG tablet Take 200 mg by mouth 2 (two) times daily.  . metFORMIN (GLUCOPHAGE-XR) 500 MG 24 hr tablet Take 1,000 mg by mouth 2 (two) times daily.  . metoprolol succinate (TOPROL-XL) 25 MG 24 hr tablet Take by mouth.  . Multiple Vitamins-Minerals (PRESERVISION AREDS 2+MULTI VIT PO)   . naproxen sodium (ANAPROX) 220 MG tablet Take 220 mg by mouth 2 (two) times daily as needed (for pain.).  Marland Kitchen pantoprazole (PROTONIX) 40 MG tablet Take by mouth.  . sacubitril-valsartan (ENTRESTO) 97-103 MG Take by mouth.  Marland Kitchen  Turmeric Curcumin 500 MG CAPS Take by mouth.   No current facility-administered medications on file prior to visit.     Allergies:   Statins and Tetanus toxoids   Social History   Tobacco Use  . Smoking status: Former Smoker    Packs/day: 1.00    Years: 59.00    Pack years: 59.00    Types: Cigarettes    Quit date: 09/05/2016    Years since quitting: 3.3  . Smokeless tobacco: Never Used  Substance Use Topics  . Alcohol use: Yes    Alcohol/week: 0.0 standard drinks    Comment: Rare  . Drug use: No    Family History: family history includes Bipolar disorder in his brother; Cancer in his  paternal grandmother; Diabetes in his brother; Heart failure in his father.  ROS:   Please see the history of present illness.  Additional pertinent ROS otherwise unremarkable.  EKGs/Labs/Other Studies Reviewed:    The following studies were reviewed today: Historical records re: cath and echo  Echo 02/19/19 1. The left ventricle has mild-moderately reduced systolic function, with an ejection fraction of 40-45%. The cavity size was normal. Left ventricular diastolic Doppler parameters are consistent with impaired relaxation. Left ventricular diffuse  hypokinesis, with slightly worse function in the lateral wall. 2. The right ventricle has normal systolic function. The cavity was normal. There is no increase in right ventricular wall thickness. Right ventricular systolic pressure could not be assessed. 3. The aortic valve is tricuspid. Aortic valve regurgitation is trivial by color flow Doppler. No stenosis of the aortic valve. 4. Cannot exclude small PFO by color flow Doppler. 5. There is redundancy of the interatrial septum.  EKG:  EKG is personally reviewed.  The ekg ordered 07/30/19 demonstrates NSR, RBBB  Recent Labs: 12/24/2019: BUN 22; Creatinine, Ser 1.22; Potassium 5.4; Sodium 138  Recent Lipid Panel    Component Value Date/Time   CHOL 120 12/24/2019 1605   TRIG 277 (H) 12/24/2019 1605   HDL 48 12/24/2019 1605   CHOLHDL 2.5 12/24/2019 1605   LDLCALC 31 12/24/2019 1605    Physical Exam:    VS:  BP 124/68   Pulse 77   Temp (!) 97.1 F (36.2 C)   Ht 5' 6.5" (1.689 m)   Wt 171 lb (77.6 kg)   SpO2 98%   BMI 27.19 kg/m     Wt Readings from Last 3 Encounters:  12/26/19 171 lb (77.6 kg)  07/24/19 170 lb (77.1 kg)  04/02/19 170 lb 12.8 oz (77.5 kg)   GEN: Well nourished, well developed in no acute distress HEENT: Normal, moist mucous membranes NECK: No JVD CARDIAC: regular rhythm, normal S1 and S2, no rubs or gallops. No murmur. VASCULAR: Radial and DP pulses  2+ bilaterally. No carotid bruits RESPIRATORY:  Clear to auscultation without rales, wheezing or rhonchi  ABDOMEN: Soft, non-tender, non-distended MUSCULOSKELETAL:  Ambulates independently SKIN: Warm and dry, no edema NEUROLOGIC:  Alert and oriented x 3. No focal neuro deficits noted. PSYCHIATRIC:  Normal affect   ASSESSMENT:    1. Chronic combined systolic and diastolic heart failure ([place])   2. Coronary artery disease due to lipid rich plaque   3. Type 2 diabetes mellitus with other circulatory complication, unspecified whether long term insulin use ([facility])   4. Statin intolerance   5. Mixed hyperlipidemia   6. Ischemic cardiomyopathy   7. Cardiac risk counseling   8. 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 Discussed that he should call me if this becomes more frequent -discussed red flag warning signs that need immediate medical attention  Mixed hyperlipidemia, statin intolerance: -LDL goal <70 -doing well on repatha -could not afford vascepa at >$500, but TG may be better now given weight loss  Type II diabetes, was on long term insulin and now off of this. -recentA1c 7.9 -did not tolerate SGLT2 inhibitor -consider GLP1-RA. Given prior treatment with insulin, will defer to Dr. Inda Merlin on this. We discussed today. -we discussed COORDINATE-DIABETES trial at prior visit  Cardiologist in [place]: Dr Newt Minion fax [phone]  Cardiac risk counseling and prevention  recommendations: -recommend heart healthy/Mediterranean diet, with whole grains, fruits, vegetable, fish, lean meats, nuts, and olive oil. Limit salt. -recommend moderate walking, 3-5 times/week for 30-50 minutes each session. Aim for at least 150 minutes.week. Goal should be pace of 3 miles/hours, or walking 1.5 miles in 30 minutes -recommend avoidance of tobacco products. Avoid excess alcohol.  Plan for follow up: 6 mos or sooner as needed  Medication Adjustments/Labs and Tests Ordered: Current medicines are reviewed at length with the patient today.  Concerns regarding medicines are outlined above.  No orders of the defined types were placed in this encounter.  No orders of the defined types were placed in this encounter.   Patient Instructions  Medication Instructions:  Your Physician recommend you continue on your current medication as directed.    *If you need a refill on your cardiac medications before your next appointment, please call your pharmacy*   Lab Work: None  Testing/Procedures: None   Follow-Up: At [facility], you and your health needs are our priority.  As part of our continuing mission to provide you with exceptional heart care, we have created designated Provider Care Teams.  These Care Teams include your primary Cardiologist (physician) and Advanced Practice Providers (APPs -  Physician Assistants and Nurse Practitioners) who all work together to provide you with the care you need, when you need it.  We recommend signing up for the patient portal called "MyChart".  Sign up information is provided on this After Visit Summary.  MyChart is used to connect with patients for Virtual Visits (Telemedicine).  Patients are able to view lab/test results, encounter notes, upcoming appointments, etc.  Non-urgent messages can be sent to your provider as well.   To learn more about what you can do with MyChart, go to [email or web address].    Your next appointment:     6 month(s)  The format for your next appointment:   In Person  Provider:   Buford Dresser, MD       Signed, Buford Dresser, MD PhD 12/26/2019    [place]

## 2019-12-26 NOTE — Patient Instructions (Signed)

## 2019-12-27 ENCOUNTER — Encounter: Payer: Self-pay | Admitting: Cardiology

## 2019-12-30 DIAGNOSIS — Z0120 Encounter for dental examination and cleaning without abnormal findings: Secondary | ICD-10-CM | POA: Diagnosis not present

## 2020-01-09 MED ORDER — FUROSEMIDE 20 MG PO TABS: ORAL_TABLET | ORAL | 3 refills | Status: DC

## 2020-01-31 ENCOUNTER — Other Ambulatory Visit: Payer: Self-pay | Admitting: *Deleted

## 2020-01-31 NOTE — Telephone Encounter (Signed)
*  STAT* If patient is at the pharmacy, call can be transferred to refill team.   1. Which medications need to be refilled? (please list name of each medication and dose if known) Metoprolol ER Succinate 25 mg qd  2. Which pharmacy/location (including street and city if local pharmacy) is medication to be sent to?[facility]  3. Do they need a 30 day or 90 day supply? [place]

## 2020-02-04 MED ORDER — METOPROLOL SUCCINATE ER 25 MG PO TB24: 25.0000 mg | ORAL_TABLET | Freq: Every day | ORAL | 1 refills | Status: DC

## 2020-02-04 NOTE — Addendum Note (Signed)
Addended by: Venetia Maxon on: 02/04/2020 01:41 PM   Modules accepted: Orders

## 2020-03-10 ENCOUNTER — Other Ambulatory Visit: Payer: Self-pay

## 2020-03-11 ENCOUNTER — Other Ambulatory Visit: Payer: Self-pay | Admitting: Cardiology

## 2020-03-11 NOTE — Telephone Encounter (Signed)
*  STAT* If patient is at the pharmacy, call can be transferred to refill team.   1. Which medications need to be refilled? (please list name of each medication and dose if known)  sacubitril-valsartan (ENTRESTO) 97-103 MG Evolocumab (REPATHA SURECLICK) 940 MG/ML SOAJ  2. Which pharmacy/location (including street and city if local pharmacy) is medication to be sent to?  [facility] [place] - [address] AT [place]  3. Do they need a 30 day or 90 day supply? 90 with refills   Pt is out of medication

## 2020-03-12 DIAGNOSIS — H40022 Open angle with borderline findings, high risk, left eye: Secondary | ICD-10-CM | POA: Diagnosis not present

## 2020-03-12 DIAGNOSIS — H401111 Primary open-angle glaucoma, right eye, mild stage: Secondary | ICD-10-CM | POA: Diagnosis not present

## 2020-03-12 MED ORDER — ENTRESTO 97-103 MG PO TABS: 1.0000 | ORAL_TABLET | Freq: Two times a day (BID) | ORAL | 3 refills | Status: DC

## 2020-03-12 NOTE — Telephone Encounter (Signed)
Patient requesting refill on entresto. I don't were Dr. Harrell Gave filled it before.

## 2020-04-16 ENCOUNTER — Ambulatory Visit (INDEPENDENT_AMBULATORY_CARE_PROVIDER_SITE_OTHER)
Admission: RE | Admit: 2020-04-16 | Discharge: 2020-04-16 | Disposition: A | Discharge status: Home / Self Care | Payer: Medicare Other | Source: Ambulatory Visit | Attending: Acute Care | Admitting: Acute Care

## 2020-04-16 ENCOUNTER — Other Ambulatory Visit: Payer: Self-pay

## 2020-04-16 DIAGNOSIS — Z87891 Personal history of nicotine dependence: Secondary | ICD-10-CM

## 2020-04-16 DIAGNOSIS — Z122 Encounter for screening for malignant neoplasm of respiratory organs: Secondary | ICD-10-CM

## 2020-04-23 ENCOUNTER — Other Ambulatory Visit: Payer: Self-pay | Admitting: *Deleted

## 2020-04-23 DIAGNOSIS — Z87891 Personal history of nicotine dependence: Secondary | ICD-10-CM

## 2020-04-23 NOTE — Progress Notes (Signed)
Please call patient and let them  know their  low dose Ct was read as a Lung RADS 2: nodules that are benign in appearance and behavior with a very low likelihood of becoming a clinically active cancer due to size or lack of growth. Recommendation per radiology is for a repeat LDCT in 12 months. .Please let them  know we will order and schedule their  annual screening scan for 04/2021 Please let them  know there was notation of CAD on their  scan.  Please remind the patient  that this is a non-gated exam therefore degree or severity of disease  cannot be determined. Please have them  follow up with their PCP regarding potential risk factor modification, dietary therapy or pharmacologic therapy if clinically indicated. Pt.  is not  currently on statin therapy. Please place order for annual  screening scan for  04/2021 and fax results to PCP. Thanks so much.

## 2020-06-11 DIAGNOSIS — L570 Actinic keratosis: Secondary | ICD-10-CM | POA: Diagnosis not present

## 2020-06-11 DIAGNOSIS — C44222 Squamous cell carcinoma of skin of right ear and external auricular canal: Secondary | ICD-10-CM | POA: Diagnosis not present

## 2020-06-11 DIAGNOSIS — D485 Neoplasm of uncertain behavior of skin: Secondary | ICD-10-CM | POA: Diagnosis not present

## 2020-06-18 DIAGNOSIS — H401111 Primary open-angle glaucoma, right eye, mild stage: Secondary | ICD-10-CM | POA: Diagnosis not present

## 2020-06-18 DIAGNOSIS — Z79899 Other long term (current) drug therapy: Secondary | ICD-10-CM | POA: Diagnosis not present

## 2020-06-18 DIAGNOSIS — E119 Type 2 diabetes mellitus without complications: Secondary | ICD-10-CM | POA: Diagnosis not present

## 2020-06-18 DIAGNOSIS — H353131 Nonexudative age-related macular degeneration, bilateral, early dry stage: Secondary | ICD-10-CM | POA: Diagnosis not present

## 2020-06-22 DIAGNOSIS — Z794 Long term (current) use of insulin: Secondary | ICD-10-CM | POA: Diagnosis not present

## 2020-06-22 DIAGNOSIS — D135 Benign neoplasm of extrahepatic bile ducts: Secondary | ICD-10-CM | POA: Diagnosis not present

## 2020-06-22 DIAGNOSIS — E119 Type 2 diabetes mellitus without complications: Secondary | ICD-10-CM | POA: Diagnosis not present

## 2020-06-22 DIAGNOSIS — Z79899 Other long term (current) drug therapy: Secondary | ICD-10-CM | POA: Diagnosis not present

## 2020-06-23 ENCOUNTER — Telehealth: Payer: Self-pay | Admitting: Cardiology

## 2020-06-23 NOTE — Telephone Encounter (Signed)
Patient had lipid panel, BMP, and A1C done in April. Please advise if you would like specific lab work to be done at PCP this week.

## 2020-06-23 NOTE — Telephone Encounter (Signed)
Casey Lynch is calling stating he has an appointment with Dr. Gavin Pound tomorrow and is wanting to know what blood work Dr. Harrell Gave is wanting for his upcoming appointment on 07/02/20 so it can been drawn tomorrow. He is requesting the lab request be sent to Dr. Trudie Reed office, but is unsure of the fax number. The number to their office is [phone]. He also would like the lab request she is wanting to be sent to him in his my chart as well, but would still like a callback to discuss. Please advise.

## 2020-06-25 DIAGNOSIS — M064 Inflammatory polyarthropathy: Secondary | ICD-10-CM | POA: Diagnosis not present

## 2020-06-25 DIAGNOSIS — M2550 Pain in unspecified joint: Secondary | ICD-10-CM | POA: Diagnosis not present

## 2020-06-25 DIAGNOSIS — M1120 Other chondrocalcinosis, unspecified site: Secondary | ICD-10-CM | POA: Diagnosis not present

## 2020-06-25 DIAGNOSIS — M150 Primary generalized (osteo)arthritis: Secondary | ICD-10-CM | POA: Diagnosis not present

## 2020-06-25 DIAGNOSIS — E089 Diabetes mellitus due to underlying condition without complications: Secondary | ICD-10-CM | POA: Diagnosis not present

## 2020-06-25 DIAGNOSIS — E7800 Pure hypercholesterolemia, unspecified: Secondary | ICD-10-CM | POA: Diagnosis not present

## 2020-06-25 DIAGNOSIS — I5022 Chronic systolic (congestive) heart failure: Secondary | ICD-10-CM | POA: Diagnosis not present

## 2020-06-30 NOTE — Telephone Encounter (Signed)
Left message to call back  

## 2020-06-30 NOTE — Telephone Encounter (Signed)
Apologies for the late response, no specific labs needed before this visit. Thanks.

## 2020-07-01 NOTE — Telephone Encounter (Signed)
Attempted to contact pt. Left detailed message stating no specific labs needed prior to scheduled appointment.

## 2020-07-02 ENCOUNTER — Ambulatory Visit: Payer: Medicare Other | Admitting: Cardiology

## 2020-07-02 ENCOUNTER — Encounter: Payer: Self-pay | Admitting: Cardiology

## 2020-07-02 ENCOUNTER — Telehealth: Payer: Self-pay

## 2020-07-02 VITALS — BP 116/54 | HR 77 | Ht 66.5 in | Wt 165.0 lb

## 2020-07-02 DIAGNOSIS — E1159 Type 2 diabetes mellitus with other circulatory complications: Secondary | ICD-10-CM | POA: Diagnosis not present

## 2020-07-02 DIAGNOSIS — R0600 Dyspnea, unspecified: Secondary | ICD-10-CM

## 2020-07-02 DIAGNOSIS — I5042 Chronic combined systolic (congestive) and diastolic (congestive) heart failure: Secondary | ICD-10-CM | POA: Diagnosis not present

## 2020-07-02 DIAGNOSIS — R0609 Other forms of dyspnea: Secondary | ICD-10-CM

## 2020-07-02 DIAGNOSIS — I2583 Coronary atherosclerosis due to lipid rich plaque: Secondary | ICD-10-CM | POA: Diagnosis not present

## 2020-07-02 DIAGNOSIS — Z789 Other specified health status: Secondary | ICD-10-CM | POA: Diagnosis not present

## 2020-07-02 DIAGNOSIS — E782 Mixed hyperlipidemia: Secondary | ICD-10-CM

## 2020-07-02 DIAGNOSIS — I255 Ischemic cardiomyopathy: Secondary | ICD-10-CM

## 2020-07-02 DIAGNOSIS — I2510 Atherosclerotic heart disease of native coronary artery without angina pectoris: Secondary | ICD-10-CM | POA: Diagnosis not present

## 2020-07-02 DIAGNOSIS — M7989 Other specified soft tissue disorders: Secondary | ICD-10-CM

## 2020-07-02 NOTE — Telephone Encounter (Signed)
Called and spoke w/pt and stated that we can reapply and call us closer to the end date of the grant 09/06/20. Pt voiced understanding

## 2020-07-02 NOTE — Progress Notes (Signed)
Cardiology Office Note:    Date:  07/02/2020   ID:  Casey Lynch, DOB 12/26/1945, MRN [id number]  PCP:  Casey Huddle, MD  Cardiologist:  Casey Dresser, MD  Referring MD: Casey Huddle, MD   CC: follow up  History of Present Illness:    Casey Lynch is a 74 y.o. male with a hx of with CAD, chronic systolic and diastolic heart failure, ischemic cardiomyopathy, hyperlipidemia, PAD (carotid bruit <50% bilaterally, left subclavian steal syndrome), COPD, former tobacco abuse, OSA on CPAP, type II diabetes. I met him 07/11/2018, and his initial cardiac evaluation from [place] is summarized in that note.  Today: Here with wife today: -when is the best time to take metoprolol? Taking at bedtime, this is fine. -does he have to be on entresto for life? Cost is $400/2 months while in the donut hole. We discussed entresto at length today, data for it vs ARB. For now we will continue.  -repatha on a grant, is there a way to get that renewed? Will send message to pharmD team to see if we can get this renewed.   We discussed the data and guidelines at length for both CAD and systolic heart failure at length today. He is doing very well on the repatha, very appreciative of the financial assistance he has received with this. No significant side effects.   We discussed entresto as well, recommendations, data. He is NYHA class II. Discussed alternatives, difference in symptoms/outcomes with other medications.  We again discussed SGLT2i, see below.  Reviewed labs from Casey Lynch' office from 06/25/20. A1c just checked at 8.6, restarted glimeperide this morning at 4 mg daily.   Planning to go back to [place] at the [facility] next spring/summer, hopefully in April 2022. Hasn't been able to visit in 2 years due to Covid.  Switching to Dr. Rogers Lynch, visit in January 2022. Tried jardiance in the past, did not improve his diabetes much. Was having urination on this, but his urinary symptoms did not  change after stopping this. If starting Jardiance, would stop lasix routinely and use only PRN. Could also cut back or stop 4 mg glimeperide. No longer on insulin after losing 35 lbs.   Denies shortness of breath at rest or with normal exertion. [place] get [place] of breath [place], which is heavy exertion. Has mild tightness in his chest with severe exertion like hills, resolves with rest. No PND, orthopnea, LE edema or unexpected weight gain. No syncope or palpitations.  Mild tingling/numbness in his feet. Has been ordered gabapentin, hasn't started yet. Does endorse intermittent hand swelling, right more than left. Recently started hydroxychloroquine for his inflammatory arthritis. Also sleeps on his side with his arm over his head. Discussed trying positioning changed to see if this improved.   Past Medical History:  Diagnosis Date  . Carotid bruit   . Diabetes mellitus without complication ([place])   . Eczema   . Heart failure ([place])   . History of colonic polyps   . Hypercholesterolemia   . Hyperlipidemia   . Macular degeneration   . Osteoarthritis   . Paresthesias   . Pseudogout   . Subclavian steal syndrome     Past Surgical History:  Procedure Laterality Date  . CARDIAC CATHETERIZATION    . COLONOSCOPY WITH PROPOFOL N/A 10/24/2016   Procedure: COLONOSCOPY WITH PROPOFOL;  Surgeon: Casey Fair, MD;  Location: [facility];  Service: Endoscopy;  Laterality: N/A;  .  HYDROCELE EXCISION / REPAIR    . KNEE SURGERY    . REFRACTIVE SURGERY    . TONSILLECTOMY      Current Medications: Current Outpatient Medications on File Prior to Visit  Medication Sig  . aspirin 81 MG chewable tablet Chew by mouth.  . B Complex Vitamins (VITAMIN B COMPLEX) TABS Take by mouth.  . Cholecalciferol (VITAMIN D3 PO) Take 1 capsule by mouth daily.  . colchicine (COLCRYS) 0.6 MG tablet Take 0.6 mg by mouth daily.  . CONTOUR NEXT TEST test strip USE  TO CHECK BLOOD SUGAR TWICE DAILY AS DIRECTED  . Evolocumab (REPATHA SURECLICK) 563 MG/ML SOAJ Inject into the skin.  . furosemide (LASIX) 20 MG tablet Take 1 tablet by mouth daily or as directed  . glimepiride (AMARYL) 4 MG tablet Take 4 mg by mouth daily with breakfast.  . hydroxychloroquine (PLAQUENIL) 200 MG tablet Take 200 mg by mouth 2 (two) times daily.  . metFORMIN (GLUCOPHAGE-XR) 500 MG 24 hr tablet Take 1,000 mg by mouth 2 (two) times daily.  . metoprolol succinate (TOPROL-XL) 25 MG 24 hr tablet Take 1 tablet (25 mg total) by mouth daily.  . Multiple Vitamins-Minerals (PRESERVISION AREDS 2+MULTI VIT PO)   . naproxen sodium (ANAPROX) 220 MG tablet Take 220 mg by mouth 2 (two) times daily as needed (for pain.).  Marland Kitchen sacubitril-valsartan (ENTRESTO) 97-103 MG Take 1 tablet by mouth 2 (two) times daily.  . Turmeric Curcumin 500 MG CAPS Take by mouth.   No current facility-administered medications on file prior to visit.     Allergies:   Statins and Tetanus toxoids   Social History   Tobacco Use  . Smoking status: Former Smoker    Packs/day: 1.00    Years: 59.00    Pack years: 59.00    Types: Cigarettes    Quit date: 09/05/2016    Years since quitting: 3.8  . Smokeless tobacco: Never Used  Vaping Use  . Vaping Use: Never used  Substance Use Topics  . Alcohol use: Yes    Alcohol/week: 0.0 standard drinks    Comment: Rare  . Drug use: No    Family History: family history includes Bipolar disorder in his brother; Cancer in his paternal grandmother; Diabetes in his brother; Heart failure in his father.  ROS:   Please see the history of present illness.  Additional pertinent ROS otherwise unremarkable.  EKGs/Labs/Other Studies Reviewed:    The following studies were reviewed today: Historical records re: cath and echo  Echo 02/19/19 1. The left ventricle has mild-moderately reduced systolic function, with an ejection fraction of 40-45%. The cavity size was normal. Left  ventricular diastolic Doppler parameters are consistent with impaired relaxation. Left ventricular diffuse  hypokinesis, with slightly worse function in the lateral wall. 2. The right ventricle has normal systolic function. The cavity was normal. There is no increase in right ventricular wall thickness. Right ventricular systolic pressure could not be assessed. 3. The aortic valve is tricuspid. Aortic valve regurgitation is trivial by color flow Doppler. No stenosis of the aortic valve. 4. Cannot exclude small PFO by color flow Doppler. 5. There is redundancy of the interatrial septum.  EKG:  EKG is personally reviewed.  The ekg ordered today demonstrates NSR, RBBB at 77 bpm  Recent Labs: 12/24/2019: BUN 22; Creatinine, Ser 1.22; Potassium 5.4; Sodium 138  Recent Lipid Panel    Component Value Date/Time   CHOL 120 12/24/2019 1605   TRIG 277 (H) 12/24/2019 1605   HDL  48 12/24/2019 1605   CHOLHDL 2.5 12/24/2019 1605   LDLCALC 31 12/24/2019 1605    Physical Exam:    VS:  BP (!) 116/54 (BP Location: Right Arm, Patient Position: Sitting, Cuff Size: Normal)   Pulse 77   Ht 5' 6.5" (1.689 m)   Wt 165 lb (74.8 kg)   BMI 26.23 kg/m     Wt Readings from Last 3 Encounters:  07/02/20 165 lb (74.8 kg)  12/26/19 171 lb (77.6 kg)  07/24/19 170 lb (77.1 kg)   GEN: Well nourished, well developed in no acute distress HEENT: Normal, moist mucous membranes NECK: No JVD CARDIAC: regular rhythm, normal S1 and S2, no rubs or gallops. No murmur. VASCULAR: Radial and DP pulses 2+ bilaterally. No carotid bruits RESPIRATORY:  Clear to auscultation without rales, wheezing or rhonchi  ABDOMEN: Soft, non-tender, non-distended MUSCULOSKELETAL:  Ambulates independently. Mild distal left hand swelling, more than right, no warmth or erythema. Pulses strong and intact SKIN: Warm and dry, no edema NEUROLOGIC:  Alert and oriented x 3. No focal neuro deficits noted. PSYCHIATRIC:  Normal affect    ASSESSMENT:    1. Coronary artery disease due to lipid rich plaque   2. Chronic combined systolic and diastolic heart failure ([place])   3. Type 2 diabetes mellitus with other circulatory complication, without long-term current use of insulin ([facility])   4. Statin intolerance   5. Mixed hyperlipidemia   6. Ischemic cardiomyopathy   7. Arm swelling   8. 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 This is expensive while in the donut hole but otherwise affordable. We discussed data, recommendations, options today. For now, will continue entresto -tolerating metoprolol succinate 25 mg daily -did not tolerate spironolactone per report -we again discussed SGLT2i today. He tried [place] briefly but felt it didn't help his sugars, and he was urinating a lot. His urination did not change significantly after stopping, and he has drastically improved his diabetes control with weight loss. As he is in the donut hole now, he wants to wait to discuss adding SGLT2i until January.  -if able, I would start jardiance 10 mg daily in January, change lasix to PRN only. May be able to cut back or stop the glimeperide once this is started, but will depend on what his A1c is closer to that time. Has had low blood sugars on higher doses of glimeperide in the past.  CAD (predominantly RCA, but diffuse mild disease) -on aspirin 81 mg -avoid long term NSAIDs given CAD -lipids as below -likely has a component of stable angina, given his dyspnea on heavy exertion and chest tightness. Discussed that he should call me if this becomes more frequent. -discussed red flag warning signs that need immediate medical attention, such as chest tightness that does not resolve with rest  Mixed hyperlipidemia, statin intolerance: -LDL goal <70 -doing well on repatha, last  LDL 31 -could not afford vascepa at >$500 -last TG 277, but focusing on lifestyle/diabetes control. Continue to monitor  Type II diabetes, was on long term insulin and now off of this. -recentA1c 8.6 -see above, he does not think SGLT2i affected him much, would retrial in January if able. -has neuropathy, will try gabapentin, hasn't started yet  Arm/hand swelling: no warmth/erythema. Counseled on position while sleeping. Recommend trying a different pillow to see if he can improve  his symptoms. He will try and let us know  Cardiologist in [place]: Dr Newt Minion fax [phone]  Cardiac risk counseling and prevention recommendations: -recommend heart healthy/Mediterranean diet, with whole grains, fruits, vegetable, fish, lean meats, nuts, and olive oil. Limit salt. -recommend moderate walking, 3-5 times/week for 30-50 minutes each session. Aim for at least 150 minutes.week. Goal should be pace of 3 miles/hours, or walking 1.5 miles in 30 minutes -recommend avoidance of tobacco products. Avoid excess alcohol.  Plan for follow up: 5 mos or sooner as needed. Will make sure his medications are set prior to his trip to [place] 12/2020.  Total time of encounter: 68 minutes total time of encounter, including 49 minutes spent in face-to-face patient care. This time includes coordination of care and counseling regarding heart failure, CAD, symptoms, and medications. Remainder of non-face-to-face time involved reviewing chart documents/testing relevant to the patient encounter and documentation in the medical record.  Casey Dresser, MD, PhD [place]  [facility]   Medication Adjustments/Labs and Tests Ordered: Current medicines are reviewed at length with the patient today.  Concerns regarding medicines are outlined above.  Orders Placed This Encounter  Procedures  . EKG 12-Lead   No orders of the defined types were placed in this encounter.   Patient Instructions   Medication Instructions:  Your Physician recommend you continue on your current medication as directed.    *If you need a refill on your cardiac medications before your next appointment, please call your pharmacy*   Lab Work: None ordered   Testing/Procedures: None ordered    Follow-Up: At [facility], you and your health needs are our priority.  As part of our continuing mission to provide you with exceptional heart care, we have created designated Provider Care Teams.  These Care Teams include your primary Cardiologist (physician) and Advanced Practice Providers (APPs -  Physician Assistants and Nurse Practitioners) who all work together to provide you with the care you need, when you need it.  We recommend signing up for the patient portal called "MyChart".  Sign up information is provided on this After Visit Summary.  MyChart is used to connect with patients for Virtual Visits (Telemedicine).  Patients are able to view lab/test results, encounter notes, upcoming appointments, etc.  Non-urgent messages can be sent to your provider as well.   To learn more about what you can do with MyChart, go to [email or web address].    Your next appointment:   6 month(s)  The format for your next appointment:   In Person  Provider:   Buford Dresser, MD      side sleeper pillow     Signed, Casey Dresser, MD PhD 07/02/2020    [place]

## 2020-07-02 NOTE — Patient Instructions (Addendum)
Medication Instructions:  Your Physician recommend you continue on your current medication as directed.    *If you need a refill on your cardiac medications before your next appointment, please call your pharmacy*   Lab Work: None ordered   Testing/Procedures: None ordered    Follow-Up: At [facility], you and your health needs are our priority.  As part of our continuing mission to provide you with exceptional heart care, we have created designated Provider Care Teams.  These Care Teams include your primary Cardiologist (physician) and Advanced Practice Providers (APPs -  Physician Assistants and Nurse Practitioners) who all work together to provide you with the care you need, when you need it.  We recommend signing up for the patient portal called "MyChart".  Sign up information is provided on this After Visit Summary.  MyChart is used to connect with patients for Virtual Visits (Telemedicine).  Patients are able to view lab/test results, encounter notes, upcoming appointments, etc.  Non-urgent messages can be sent to your provider as well.   To learn more about what you can do with MyChart, go to [email or web address].    Your next appointment:   6 month(s)  The format for your next appointment:   In Person  Provider:   Buford Dresser, MD      side sleeper pillow

## 2020-07-02 NOTE — Telephone Encounter (Signed)
-----   Message from Buford Dresser, MD sent at 07/02/2020 12:38 PM EDT ----- Regarding: repatha grant Hey team, Casey Lynch is so appreciative of how much you helped with the repatha costs for this year. He was asking if this needs to be re-submitted every year or if there was anything else he needed to do to continue getting the discount. I told him I would ask the experts!  Thanks so much, [employer]

## 2020-07-14 MED ORDER — METOPROLOL SUCCINATE ER 25 MG PO TB24: 25.0000 mg | ORAL_TABLET | Freq: Every day | ORAL | 3 refills | Status: DC

## 2020-08-04 ENCOUNTER — Telehealth: Payer: Self-pay | Admitting: Pulmonary Disease

## 2020-08-05 DIAGNOSIS — Z20822 Contact with and (suspected) exposure to covid-19: Secondary | ICD-10-CM | POA: Diagnosis not present

## 2020-08-05 NOTE — Telephone Encounter (Signed)
Called and spoke with pt's wife Olin Hauser who stated she tested positive for Covid 11/29. 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  Olin Hauser stated that she was going to try to get pt covid tested as well due to them living together and him being around her. Olin Hauser did not state that he was having any symptoms, just stated that he was not having the same symptoms as her (her symptoms can be seen in her open encounter).  Dr. Vaughan Browner, please advise on any recommendations for pt.

## 2020-08-05 NOTE — Telephone Encounter (Signed)
I have and spoke with Casey Lynch and Casey Lynch.  She has mild symptoms which started on 4 days ago Advised her to self quarantine at home for at least 2 weeks I will make referral for monoclonal antibody infusion.  Casey Lynch is going to get tested as well.  If he is positive then he can be referred as well for the antibody infusion  Marshell Garfinkel MD [facility] 08/05/2020, 9:45 AM

## 2020-08-07 ENCOUNTER — Telehealth: Payer: Self-pay | Admitting: Pulmonary Disease

## 2020-08-07 NOTE — Telephone Encounter (Signed)
mychart message sent by pt's wife Olin Hauser who is also a Dr. Vaughan Browner pt stating that pt's Covid test has come back positive. Olin Hauser is wanting to know if pt can be referred to have the antibody infusion. Dr. Vaughan Browner please advise.

## 2020-08-07 NOTE — Telephone Encounter (Signed)
I have called and LM on VM for the pts wife to make her aware that PM has placed the referral for the antibody infusion and that they should receive the call in the next 24 to 48 hours.  If they have any concerns they are to call the office back.

## 2020-08-07 NOTE — Telephone Encounter (Signed)
I have referred him.  Please expect a phone call in the next 24 to 48 hours.

## 2020-08-08 ENCOUNTER — Other Ambulatory Visit: Payer: Self-pay | Admitting: Physician Assistant

## 2020-08-08 ENCOUNTER — Telehealth: Payer: Self-pay | Admitting: Physician Assistant

## 2020-08-08 DIAGNOSIS — U071 COVID-19: Secondary | ICD-10-CM

## 2020-08-08 DIAGNOSIS — I255 Ischemic cardiomyopathy: Secondary | ICD-10-CM

## 2020-08-08 DIAGNOSIS — I5042 Chronic combined systolic (congestive) and diastolic (congestive) heart failure: Secondary | ICD-10-CM

## 2020-08-08 DIAGNOSIS — I272 Pulmonary hypertension, unspecified: Secondary | ICD-10-CM

## 2020-08-08 DIAGNOSIS — E1159 Type 2 diabetes mellitus with other circulatory complications: Secondary | ICD-10-CM

## 2020-08-08 NOTE — Telephone Encounter (Signed)
Called to discuss with patient about Covid symptoms and the use of sotrovimab, bamlanivimab/etesevimab or casirivimab/imdevimab, a monoclonal antibody infusion for those with mild to moderate Covid symptoms and at a high risk of hospitalization.  Pt is qualified for this infusion at the [facility] center due to; Specific high risk criteria : Older age (>/= 75 yo), Diabetes, Cardiovascular disease or hypertension and Chronic Lung Disease   Message left to call back our hotline [phone]. My chart message sent if active on Mychart.   [name] PA-C

## 2020-08-08 NOTE — Progress Notes (Signed)
I connected by phone with Casey Lynch on 08/08/2020 at 12:25 PM to discuss the potential use of a new treatment for mild to moderate COVID-19 viral infection in non-hospitalized patients.  This patient is a 74 y.o. male that meets the FDA criteria for Emergency Use Authorization of COVID monoclonal antibody sotrovimab, casirivimab/imdevimab or bamlamivimab/estevimab.  Has a (+) direct SARS-CoV-2 viral test result  Has mild or moderate COVID-19   Is NOT hospitalized due to COVID-19  Is within 10 days of symptom onset  Has at least one of the high risk factor(s) for progression to severe COVID-19 and/or hospitalization as defined in EUA.  Specific high risk criteria : Older age (>/= 75 yo), BMI > 25, Diabetes, Cardiovascular disease or hypertension, Chronic Lung Disease and Other high risk medical condition per CDC:  high SVI   I have spoken and communicated the following to the patient or parent/caregiver regarding COVID monoclonal antibody treatment:  1. FDA has authorized the emergency use for the treatment of mild to moderate COVID-19 in adults and pediatric patients with positive results of direct SARS-CoV-2 viral testing who are 64 years of age and older weighing at least 40 kg, and who are at high risk for progressing to severe COVID-19 and/or hospitalization.  2. The significant known and potential risks and benefits of COVID monoclonal antibody, and the extent to which such potential risks and benefits are unknown.  3. Information on available alternative treatments and the risks and benefits of those alternatives, including clinical trials.  4. Patients treated with COVID monoclonal antibody should continue to self-isolate and use infection control measures (e.g., wear mask, isolate, social distance, avoid sharing personal items, clean and disinfect "high touch" surfaces, and frequent handwashing) according to CDC guidelines.   5. The patient or parent/caregiver has the option to  accept or refuse COVID monoclonal antibody treatment.  After reviewing this information with the patient, the patient has agreed to receive one of the available covid 19 monoclonal antibodies and will be provided an appropriate fact sheet prior to infusion.  Sx onset 11/29. Set up for infusion on 12/7 @ 10:30am . Directions given to [facility]. Pt is aware that insurance will be charged an infusion fee. Pt is fully vaccinated. Will e-mail over + result.   [name] 08/08/2020 12:25 PM

## 2020-08-09 ENCOUNTER — Telehealth: Payer: Self-pay | Admitting: Physician Assistant

## 2020-08-09 NOTE — Telephone Encounter (Signed)
   Casey Lynch was recently diagnosed with Covid, and is scheduled to get the infusion that is offered in a couple of days.  He is not struggling too much with the Covid, not terribly symptomatic.  Patient's wife called because he had an episode of chest pain last night.  It was fairly severe at the time.  It started at rest.  He does not have nitroglycerin and did not take anything for the pain.  She was following his vital signs and his O2 saturation while he was feeling bad, and his blood pressure was a little high, but nothing else was out of whack.  His symptoms eventually resolved completely.  Today he feels well.  As he is completely asymptomatic and not having any chest pain or shortness of breath with exertion, hold off on evaluation for now.  We are not doing in person visits on patients that are under Covid quarantine.  I explained that he would be under quarantine for 10 days from the diagnosis.  Spoke with Dr. Harrell Gave, she has a virtual office day on Thursday, I have sent a message to get him an appointment at that time.  Rosaria Ferries, PA-C 08/09/2020 1:28 PM

## 2020-08-10 ENCOUNTER — Other Ambulatory Visit (HOSPITAL_COMMUNITY): Payer: Self-pay

## 2020-08-11 ENCOUNTER — Ambulatory Visit (HOSPITAL_COMMUNITY)
Admission: RE | Admit: 2020-08-11 | Discharge: 2020-08-11 | Disposition: A | Discharge status: Home / Self Care | Payer: Medicare Other | Source: Ambulatory Visit | Attending: Pulmonary Disease | Admitting: Pulmonary Disease

## 2020-08-11 DIAGNOSIS — I5042 Chronic combined systolic (congestive) and diastolic (congestive) heart failure: Secondary | ICD-10-CM | POA: Insufficient documentation

## 2020-08-11 DIAGNOSIS — I272 Pulmonary hypertension, unspecified: Secondary | ICD-10-CM | POA: Diagnosis present

## 2020-08-11 DIAGNOSIS — U071 COVID-19: Secondary | ICD-10-CM | POA: Insufficient documentation

## 2020-08-11 DIAGNOSIS — E1159 Type 2 diabetes mellitus with other circulatory complications: Secondary | ICD-10-CM | POA: Diagnosis present

## 2020-08-11 DIAGNOSIS — I255 Ischemic cardiomyopathy: Secondary | ICD-10-CM | POA: Insufficient documentation

## 2020-08-11 DIAGNOSIS — Z23 Encounter for immunization: Secondary | ICD-10-CM | POA: Diagnosis not present

## 2020-08-11 MED ORDER — DIPHENHYDRAMINE HCL 50 MG/ML IJ SOLN: 50.0000 mg | Freq: Once | INTRAMUSCULAR | Status: DC | PRN

## 2020-08-11 MED ORDER — SODIUM CHLORIDE 0.9 % IV SOLN: INTRAVENOUS | Status: DC | PRN

## 2020-08-11 MED ORDER — EPINEPHRINE 0.3 MG/0.3ML IJ SOAJ: 0.3000 mg | Freq: Once | INTRAMUSCULAR | Status: DC | PRN

## 2020-08-11 MED ORDER — METHYLPREDNISOLONE SODIUM SUCC 125 MG IJ SOLR: 125.0000 mg | Freq: Once | INTRAMUSCULAR | Status: DC | PRN

## 2020-08-11 MED ORDER — FAMOTIDINE IN NACL 20-0.9 MG/50ML-% IV SOLN: 20.0000 mg | Freq: Once | INTRAVENOUS | Status: DC | PRN

## 2020-08-11 MED ORDER — ALBUTEROL SULFATE HFA 108 (90 BASE) MCG/ACT IN AERS: 2.0000 | INHALATION_SPRAY | Freq: Once | RESPIRATORY_TRACT | Status: DC | PRN

## 2020-08-11 MED ORDER — SODIUM CHLORIDE 0.9 % IV SOLN: Freq: Once | INTRAVENOUS | Status: AC

## 2020-08-11 NOTE — Telephone Encounter (Signed)
Message sent to scheduling to schedule appointment.

## 2020-08-11 NOTE — Discharge Instructions (Signed)
10 Things You Can Do to Manage Your COVID-19 Symptoms at Home If you have possible or confirmed COVID-19: 1. Stay home from work and school. And stay away from other public places. If you must go out, avoid using any kind of public transportation, ridesharing, or taxis. 2. Monitor your symptoms carefully. If your symptoms get worse, call your healthcare provider immediately. 3. Get rest and stay hydrated. 4. If you have a medical appointment, call the healthcare provider ahead of time and tell them that you have or may have COVID-19. 5. For medical emergencies, call 911 and notify the dispatch personnel that you have or may have COVID-19. 6. Cover your cough and sneezes with a tissue or use the inside of your elbow. 7. Wash your hands often with soap and water for at least 20 seconds or clean your hands with an alcohol-based hand sanitizer that contains at least 60% alcohol. 8. As much as possible, stay in a specific room and away from other people in your home. Also, you should use a separate bathroom, if available. If you need to be around other people in or outside of the home, wear a mask. 9. Avoid sharing personal items with other people in your household, like dishes, towels, and bedding. 10. Clean all surfaces that are touched often, like counters, tabletops, and doorknobs. Use household cleaning sprays or wipes according to the label instructions. cdc.gov/coronavirus 03/06/2019 This information is not intended to replace advice given to you by your health care provider. Make sure you discuss any questions you have with your health care provider. Document Revised: 08/08/2019 Document Reviewed: 08/08/2019 Elsevier Patient Education  2020 Elsevier Inc. What types of side effects do monoclonal antibody drugs cause?  Common side effects  In general, the more common side effects caused by monoclonal antibody drugs include: . Allergic reactions, such as hives or itching . Flu-like signs and  symptoms, including chills, fatigue, fever, and muscle aches and pains . Nausea, vomiting . Diarrhea . Skin rashes . Low blood pressure   The CDC is recommending patients who receive monoclonal antibody treatments wait at least 90 days before being vaccinated.  Currently, there are no data on the safety and efficacy of mRNA COVID-19 vaccines in persons who received monoclonal antibodies or convalescent plasma as part of COVID-19 treatment. Based on the estimated half-life of such therapies as well as evidence suggesting that reinfection is uncommon in the 90 days after initial infection, vaccination should be deferred for at least 90 days, as a precautionary measure until additional information becomes available, to avoid interference of the antibody treatment with vaccine-induced immune responses. If you have any questions or concerns after the infusion please call the Advanced Practice Provider on call at [phone]. This number is ONLY intended for your use regarding questions or concerns about the infusion post-treatment side-effects.  Please do not provide this number to others for use. For return to work notes please contact your primary care provider.   If someone you know is interested in receiving treatment please have them call the COVID hotline at [phone].   

## 2020-08-11 NOTE — Progress Notes (Addendum)
  Diagnosis: COVID-19  Physician: Dr. Asencion Noble  Procedure: Medication fact sheet provided to patient; all questions answered.  Allergies reviewed with patient. Casirivimab/imdevimab administered via IV infusion.  Complications: No immediate complications noted.  Discharge: Discharged home   Monna Fam 08/11/2020

## 2020-08-11 NOTE — Progress Notes (Addendum)
Patient reviewed Fact Sheet for Patients, Parents, and Caregivers for Emergency Use Authorization (EUA) of casirivimab/imdevimab for the Treatment of Coronavirus.  Patient also reviewed and is agreeable to the estimated cost of treatment.  Patient is agreeable to proceed.

## 2020-08-13 ENCOUNTER — Encounter: Payer: Self-pay | Admitting: Cardiology

## 2020-08-13 ENCOUNTER — Telehealth (INDEPENDENT_AMBULATORY_CARE_PROVIDER_SITE_OTHER): Payer: Medicare Other | Admitting: Cardiology

## 2020-08-13 VITALS — BP 137/73 | HR 72 | Ht 66.0 in | Wt 159.2 lb

## 2020-08-13 DIAGNOSIS — R079 Chest pain, unspecified: Secondary | ICD-10-CM

## 2020-08-13 DIAGNOSIS — E782 Mixed hyperlipidemia: Secondary | ICD-10-CM

## 2020-08-13 DIAGNOSIS — I5042 Chronic combined systolic (congestive) and diastolic (congestive) heart failure: Secondary | ICD-10-CM | POA: Diagnosis not present

## 2020-08-13 DIAGNOSIS — I255 Ischemic cardiomyopathy: Secondary | ICD-10-CM

## 2020-08-13 DIAGNOSIS — I2510 Atherosclerotic heart disease of native coronary artery without angina pectoris: Secondary | ICD-10-CM | POA: Diagnosis not present

## 2020-08-13 DIAGNOSIS — Z789 Other specified health status: Secondary | ICD-10-CM

## 2020-08-13 DIAGNOSIS — Z8616 Personal history of COVID-19: Secondary | ICD-10-CM

## 2020-08-13 DIAGNOSIS — E1159 Type 2 diabetes mellitus with other circulatory complications: Secondary | ICD-10-CM

## 2020-08-13 MED ORDER — NITROGLYCERIN 0.4 MG SL SUBL
0.4000 mg | SUBLINGUAL_TABLET | SUBLINGUAL | 3 refills | Status: DC | PRN
Start: 2020-08-13 — End: 2021-09-27

## 2020-08-13 NOTE — Progress Notes (Signed)
Virtual Visit via Video Note   This visit type was conducted due to national recommendations for restrictions regarding the COVID-19 Pandemic (e.g. social distancing) in an effort to limit this patient's exposure and mitigate transmission in our community.  Due to his co-morbid illnesses, this patient is at least at moderate risk for complications without adequate follow up.  This format is felt to be most appropriate for this patient at this time.  All issues noted in this document were discussed and addressed.  A limited physical exam was performed with this format.  Please refer to the patient's chart for his consent to telehealth for [facility].  The patient was identified using 2 identifiers.  Patient Location: Home Provider Location: Home Office  Date:  08/13/2020   ID:  Casey Lynch, DOB 12/10/1945, MRN [id number]  PCP:  Orma Flaming, MD  Cardiologist:  Buford Dresser, MD  Referring MD: Josetta Huddle, MD   CC: follow up  History of Present Illness:    Casey Lynch is a 74 y.o. male with a hx of with CAD, chronic systolic and diastolic heart failure, ischemic cardiomyopathy, hyperlipidemia, PAD (carotid bruit <50% bilaterally, left subclavian steal syndrome), COPD, former tobacco abuse, OSA on CPAP, type II diabetes. I met him 07/11/2018, and his initial cardiac evaluation from [place] is summarized in that note.  Today: Recently diagnosed with Covid. He received the infusion of casirivimab and imdevimab (monoclonal antibodies) on 08/11/20. His wife called the office on 12/5 after he had an episode of chest pain overnight 12/4.  He has done well post infusion. The episode he had occurred in the middle of the night (12:15 AM 12/5). He was very dizzy, central diffuse chest pain, worse when he laid down. All his vitals were stable. Wife put her apple watch on him and did an ECG, was NSR. Lasted 10-20 minutes. The pain was across his entire chest, as well as in the right  arm. Not in the left chest. No nausea, no shortness of breath. Main issue was dizziness. Hadn't done anything in particular the day prior, had been staying home in quarantine. He has had this rarely with exertion in the past.   Past Medical History:  Diagnosis Date  . Carotid bruit   . Diabetes mellitus without complication ([place])   . Eczema   . Heart failure ([place])   . History of colonic polyps   . Hypercholesterolemia   . Hyperlipidemia   . Macular degeneration   . Osteoarthritis   . Paresthesias   . Pseudogout   . Subclavian steal syndrome     Past Surgical History:  Procedure Laterality Date  . CARDIAC CATHETERIZATION    . COLONOSCOPY WITH PROPOFOL N/A 10/24/2016   Procedure: COLONOSCOPY WITH PROPOFOL;  Surgeon: Garlan Fair, MD;  Location: [facility] END[facility]COPY;  Service: Endoscopy;  Laterality: N/A;  . HYDROCELE EXCISION / REPAIR    . KNEE SURGERY    . REFRACTIVE SURGERY    . TONSILLECTOMY      Current Medications: Current Outpatient Medications on File Prior to Visit  Medication Sig  . aspirin 81 MG chewable tablet Chew by mouth.  . B Complex Vitamins (VITAMIN B COMPLEX) TABS Take by mouth.  . Cholecalciferol (VITAMIN D3 PO) Take 1 capsule by mouth daily.  . colchicine 0.6 MG tablet Take 0.6 mg by mouth daily.  . CONTOUR NEXT TEST test strip USE TO CHECK BLOOD SUGAR TWICE DAILY AS DIRECTED  . Evolocumab (REPATHA SURECLICK) 315 MG/ML SOAJ  Inject into the skin.  . furosemide (LASIX) 20 MG tablet Take 1 tablet by mouth daily or as directed  . hydroxychloroquine (PLAQUENIL) 200 MG tablet Take 200 mg by mouth 2 (two) times daily.  . metFORMIN (GLUCOPHAGE-XR) 500 MG 24 hr tablet Take 1,000 mg by mouth 2 (two) times daily.  . metoprolol succinate (TOPROL-XL) 25 MG 24 hr tablet Take 1 tablet (25 mg total) by mouth daily.  . naproxen sodium (ANAPROX) 220 MG tablet Take 220 mg by mouth 2 (two) times daily as needed (for pain.).  Marland Kitchen sacubitril-valsartan (ENTRESTO) 97-103 MG Take 1  tablet by mouth 2 (two) times daily.  . Turmeric Curcumin 500 MG CAPS Take by mouth.   No current facility-administered medications on file prior to visit.     Allergies:   Statins and Tetanus toxoids   Social History   Tobacco Use  . Smoking status: Former Smoker    Packs/day: 1.00    Years: 59.00    Pack years: 59.00    Types: Cigarettes    Quit date: 09/05/2016    Years since quitting: 3.9  . Smokeless tobacco: Never Used  Vaping Use  . Vaping Use: Never used  Substance Use Topics  . Alcohol use: Yes    Alcohol/week: 0.0 standard drinks    Comment: Rare  . Drug use: No    Family History: family history includes Bipolar disorder in his brother; Cancer in his paternal grandmother; Diabetes in his brother; Heart failure in his father.  ROS:   Please see the history of present illness.  Additional pertinent ROS otherwise unremarkable.  EKGs/Labs/Other Studies Reviewed:    The following studies were reviewed today: Historical records re: cath and echo  Echo 02/19/19 1. The left ventricle has mild-moderately reduced systolic function, with an ejection fraction of 40-45%. The cavity size was normal. Left ventricular diastolic Doppler parameters are consistent with impaired relaxation. Left ventricular diffuse  hypokinesis, with slightly worse function in the lateral wall. 2. The right ventricle has normal systolic function. The cavity was normal. There is no increase in right ventricular wall thickness. Right ventricular systolic pressure could not be assessed. 3. The aortic valve is tricuspid. Aortic valve regurgitation is trivial by color flow Doppler. No stenosis of the aortic valve. 4. Cannot exclude small PFO by color flow Doppler. 5. There is redundancy of the interatrial septum.  EKG:  EKG is personally reviewed.  The ekg ordered 07/02/20 demonstrates NSR, RBBB at 77 bpm  Recent Labs: 12/24/2019: BUN 22; Creatinine, Ser 1.22; Potassium 5.4; Sodium 138  Recent  Lipid Panel    Component Value Date/Time   CHOL 120 12/24/2019 1605   TRIG 277 (H) 12/24/2019 1605   HDL 48 12/24/2019 1605   CHOLHDL 2.5 12/24/2019 1605   LDLCALC 31 12/24/2019 1605    Physical Exam:    VS:  BP 137/73   Pulse 72   Ht 5' 6" (1.676 m)   Wt 159 lb 3.2 oz (72.2 kg)   SpO2 98%   BMI 25.70 kg/m     Wt Readings from Last 3 Encounters:  08/13/20 159 lb 3.2 oz (72.2 kg)  07/02/20 165 lb (74.8 kg)  12/26/19 171 lb (77.6 kg)   VITAL SIGNS:  reviewed GEN:  no acute distress EYES:  sclerae anicteric, EOMI - Extraocular Movements Intact RESPIRATORY:  normal respiratory effort, symmetric expansion CARDIOVASCULAR:  no visible JVD SKIN:  no rash, lesions or ulcers. MUSCULOSKELETAL:  no obvious deformities. NEURO:  alert and oriented  x 3, no obvious focal deficit PSYCH:  normal affect  ASSESSMENT:    1. Chest pain, unspecified type   2. Chronic combined systolic and diastolic heart failure ([place])   3. Ischemic cardiomyopathy   4. Coronary artery disease involving native coronary artery of native heart, unspecified whether angina present   5. Type 2 diabetes mellitus with other circulatory complication, without long-term current use of insulin ([place])   6. Mixed hyperlipidemia   7. Statin intolerance   8. Personal history of COVID-19    PLAN:    Chest pain: after Covid 19 infection/treatment -ordered for SL NG, instructed on use -will get echo given Covid, stress test given chest pain and history of CAD. 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  -tolerating metoprolol succinate 25 mg daily -did not tolerate spironolactone per report -we again discussed SGLT2i today. He tried [place] briefly but felt it didn't help his sugars, and he was urinating a lot. His  urination did not change significantly after stopping, and he has drastically improved his diabetes control with weight loss. Continue to discuss   CAD (predominantly RCA, but diffuse mild disease) -on aspirin 81 mg -avoid long term NSAIDs given CAD -lipids as below  Mixed hyperlipidemia, statin intolerance: -LDL goal <70 -doing well on repatha, last LDL 31 -could not afford vascepa at >$500 -last TG 277, but focusing on lifestyle/diabetes control. Continue to monitor  Type II diabetes, was on long term insulin and now off of this. -recentA1c 7.2 -see above, he does not think SGLT2i affected him much, would retrial in January if able. -has neuropathy symptoms intermittently  Cardiologist in [place]: Dr Newt Minion fax [phone]  Cardiac risk counseling and prevention recommendations: -recommend heart healthy/Mediterranean diet, with whole grains, fruits, vegetable, fish, lean meats, nuts, and olive oil. Limit salt. -recommend moderate walking, 3-5 times/week for 30-50 minutes each session. Aim for at least 150 minutes.week. Goal should be pace of 3 miles/hours, or walking 1.5 miles in 30 minutes -recommend avoidance of tobacco products. Avoid excess alcohol.  Plan for follow up: 6 weeks  Today, I have spent 31 minutes with the patient with telehealth technology discussing the above problems.  Additional time spent in chart review, documentation, and communication.  Buford Dresser, MD, PhD [place]  [facility]   Medication Adjustments/Labs and Tests Ordered: Current medicines are reviewed at length with the patient today.  Concerns regarding medicines are outlined above.  Orders Placed This Encounter  Procedures  . MYOCARDIAL PERFUSION IMAGING  . ECHOCARDIOGRAM COMPLETE   Meds ordered this encounter  Medications  . nitroGLYCERIN (NITROSTAT) 0.4 MG SL tablet    Sig: Place 1 tablet (0.4 mg total) under the tongue every 5 (five) minutes as needed for  chest pain.    Dispense:  90 tablet    Refill:  3    Patient Instructions  Medication Instructions:  Take Nitroglycerin 0.4 mg as needed for chest pain as directed.  *If you need a refill on your cardiac medications before your next appointment, please call your pharmacy*   Lab Work: None   Testing/Procedures: Your physician has requested that you have an echocardiogram. Echocardiography is a painless test that uses sound waves to create images of your heart. It provides your doctor with information about the size and shape of your heart and how well  your heart's chambers and valves are working. This procedure takes approximately one hour. There are no restrictions for this procedure. [place] has requested that you have a lexiscan myoview. For further information please visit [email or web address]. Please follow instruction sheet, as given. [place]. [address]   Follow-Up: At [facility], you and your health needs are our priority.  As part of our continuing mission to provide you with exceptional heart care, we have created designated Provider Care Teams.  These Care Teams include your primary Cardiologist (physician) and Advanced Practice Providers (APPs -  Physician Assistants and Nurse Practitioners) who all work together to provide you with the care you need, when you need it.  We recommend signing up for the patient portal called "MyChart".  Sign up information is provided on this After Visit Summary.  MyChart is used to connect with patients for Virtual Visits (Telemedicine).  Patients are able to view lab/test results, encounter notes, upcoming appointments, etc.  Non-urgent messages can be sent to your provider as well.   To learn more about what you can do with MyChart, go to [email or web address].    Your next appointment:   6 week(s)  The format for your next appointment:   In Person  Provider:   Buford Dresser, MD  You are scheduled for a Myocardial Perfusion Imaging Study. Please arrive 15 minutes prior to your appointment time for registration and insurance purposes.  The test will take approximately 3 to 4 hours to complete; you may bring reading material.  If someone comes with you to your appointment, they will need to remain in the main lobby due to limited space in the testing area. **If you are pregnant or breastfeeding, please notify the nuclear lab prior to your appointment**  How to prepare for your Myocardial Perfusion Test: . Do not eat or drink 3 hours prior to your test, except you may have water. . Do not consume products containing caffeine (regular or decaffeinated) 12 hours prior to your test. (ex: coffee, chocolate, sodas, tea). . Do bring a list of your current medications with you.  If not listed below, you may take your medications as normal. . Do wear comfortable clothes (no dresses or overalls) and walking shoes, tennis shoes preferred (No heels or open toe shoes are allowed). . Do NOT wear cologne, perfume, aftershave, or lotions (deodorant is allowed). . If these instructions are not followed, your test will have to be rescheduled.  Please report to [place], [address] for your test.  If you have questions or concerns about your appointment, you can call the Nuclear Lab at [phone].  If you cannot keep your appointment, please provide 24 hours notification to the Nuclear Lab, to avoid a possible $50 charge to your account.  Nitroglycerin sublingual tablets What is this medicine? NITROGLYCERIN (nye troe GLI ser in) is a type of vasodilator. It relaxes blood vessels, increasing the blood and oxygen supply to your heart. This medicine is used to relieve chest pain caused by angina. It is also used to prevent chest pain before activities like climbing stairs, going outdoors in cold weather, or sexual activity. This medicine may be used for other  purposes; ask your health care provider or pharmacist if you have questions. COMMON BRAND NAME(S): Nitroquick, Nitrostat, Nitrotab What should I tell my health care provider before I take this medicine? They need to know if you have any of these conditions:  anemia  head injury, recent stroke, or bleeding in the brain  liver disease  previous heart attack  an unusual or allergic reaction to nitroglycerin, other medicines, foods, dyes, or preservatives  pregnant or trying to get pregnant  breast-feeding How should I use this medicine? Take this medicine by mouth as needed. At the first sign of an angina attack (chest pain or tightness) place one tablet under your tongue. You can also take this medicine 5 to 10 minutes before an event likely to produce chest pain. Follow the directions on the prescription label. Let the tablet dissolve under the tongue. Do not swallow whole. Replace the dose if you accidentally swallow it. It will help if your mouth is not dry. Saliva around the tablet will help it to dissolve more quickly. Do not eat or drink, smoke or chew tobacco while a tablet is dissolving. If you are not better within 5 minutes after taking ONE dose of nitroglycerin, call 9-1-1 immediately to seek emergency medical care. Do not take more than 3 nitroglycerin tablets over 15 minutes. If you take this medicine often to relieve symptoms of angina, your doctor or health care professional may provide you with different instructions to manage your symptoms. If symptoms do not go away after following these instructions, it is important to call 9-1-1 immediately. Do not take more than 3 nitroglycerin tablets over 15 minutes. Talk to your pediatrician regarding the use of this medicine in children. Special care may be needed. Overdosage: If you think you have taken too much of this medicine contact a poison control center or emergency room at once. NOTE: This medicine is only for you. Do not share  this medicine with others. What if I miss a dose? This does not apply. This medicine is only used as needed. What may interact with this medicine? Do not take this medicine with any of the following medications:  certain migraine medicines like ergotamine and dihydroergotamine (DHE)  medicines used to treat erectile dysfunction like sildenafil, tadalafil, and vardenafil  riociguat This medicine may also interact with the following medications:  alteplase  aspirin  heparin  medicines for high blood pressure  medicines for mental depression  other medicines used to treat angina  phenothiazines like chlorpromazine, mesoridazine, prochlorperazine, thioridazine This list may not describe all possible interactions. Give your health care provider a list of all the medicines, herbs, non-prescription drugs, or dietary supplements you use. Also tell them if you smoke, drink alcohol, or use illegal drugs. Some items may interact with your medicine. What should I watch for while using this medicine? Tell your doctor or health care professional if you feel your medicine is no longer working. Keep this medicine with you at all times. Sit or lie down when you take your medicine to prevent falling if you feel dizzy or faint after using it. Try to remain calm. This will help you to feel better faster. If you feel dizzy, take several deep breaths and lie down with your feet propped up, or bend forward with your head resting between your knees. You may get drowsy or dizzy. Do not drive, use machinery, or do anything that needs mental alertness until you know how this drug affects you. Do not stand or sit up quickly, especially if you are an older patient. This reduces the risk of dizzy or fainting spells. Alcohol can make you more drowsy and dizzy. Avoid alcoholic drinks. Do not treat yourself for coughs, colds, or pain while you are taking this medicine  without asking your doctor or health care  professional for advice. Some ingredients may increase your blood pressure. What side effects may I notice from receiving this medicine? Side effects that you should report to your doctor or health care professional as soon as possible:  blurred vision  dry mouth  skin rash  sweating  the feeling of extreme pressure in the head  unusually weak or tired Side effects that usually do not require medical attention (report to your doctor or health care professional if they continue or are bothersome):  flushing of the face or neck  headache  irregular heartbeat, palpitations  nausea, vomiting This list may not describe all possible side effects. Call your doctor for medical advice about side effects. You may report side effects to FDA at 1-800-FDA-1088. Where should I keep my medicine? Keep out of the reach of children. Store at room temperature between 20 and 25 degrees C (68 and 77 degrees F). Store in [facility] [employer]. Protect from light and moisture. Keep tightly closed. Throw away any unused medicine after the expiration date. NOTE: This sheet is a summary. It may not cover all possible information. If you have questions about this medicine, talk to your doctor, pharmacist, or health care provider.  2020 Elsevier/Gold Standard (2013-06-20 17:57:36)    Signed, Buford Dresser, MD PhD 08/13/2020    [facility]

## 2020-08-13 NOTE — Patient Instructions (Signed)
Medication Instructions:  Take Nitroglycerin 0.4 mg as needed for chest pain as directed.  *If you need a refill on your cardiac medications before your next appointment, please call your pharmacy*   Lab Work: None   Testing/Procedures: Your physician has requested that you have an echocardiogram. Echocardiography is a painless test that uses sound waves to create images of your heart. It provides your doctor with information about the size and shape of your heart and how well your heart's chambers and valves are working. This procedure takes approximately one hour. There are no restrictions for this procedure. [place] has requested that you have a lexiscan myoview. For further information please visit [email or web address]. Please follow instruction sheet, as given. [address]   Follow-Up: At [facility], you and your health needs are our priority.  As part of our continuing mission to provide you with exceptional heart care, we have created designated Provider Care Teams.  These Care Teams include your primary Cardiologist (physician) and Advanced Practice Providers (APPs -  Physician Assistants and Nurse Practitioners) who all work together to provide you with the care you need, when you need it.  We recommend signing up for the patient portal called "MyChart".  Sign up information is provided on this After Visit Summary.  MyChart is used to connect with patients for Virtual Visits (Telemedicine).  Patients are able to view lab/test results, encounter notes, upcoming appointments, etc.  Non-urgent messages can be sent to your provider as well.   To learn more about what you can do with MyChart, go to [email or web address].    Your next appointment:   6 week(s)  The format for your next appointment:   In Person  Provider:   Buford Dresser, MD  You are scheduled for a Myocardial Perfusion Imaging Study. Please  arrive 15 minutes prior to your appointment time for registration and insurance purposes.  The test will take approximately 3 to 4 hours to complete; you may bring reading material.  If someone comes with you to your appointment, they will need to remain in the main lobby due to limited space in the testing area. **If you are pregnant or breastfeeding, please notify the nuclear lab prior to your appointment**  How to prepare for your Myocardial Perfusion Test: . Do not eat or drink 3 hours prior to your test, except you may have water. . Do not consume products containing caffeine (regular or decaffeinated) 12 hours prior to your test. (ex: coffee, chocolate, sodas, tea). . Do bring a list of your current medications with you.  If not listed below, you may take your medications as normal. . Do wear comfortable clothes (no dresses or overalls) and walking shoes, tennis shoes preferred (No heels or open toe shoes are allowed). . Do NOT wear cologne, perfume, aftershave, or lotions (deodorant is allowed). . If these instructions are not followed, your test will have to be rescheduled.  Please report to [employer], [address] for your test.  If you have questions or concerns about your appointment, you can call the Nuclear Lab at [phone].  If you cannot keep your appointment, please provide 24 hours notification to the Nuclear Lab, to avoid a possible $50 charge to your account.  Nitroglycerin sublingual tablets What is this medicine? NITROGLYCERIN (nye troe GLI ser in) is a type of vasodilator. It relaxes blood vessels, increasing the blood and oxygen supply to your heart. This medicine is  used to relieve chest pain caused by angina. It is also used to prevent chest pain before activities like climbing stairs, going outdoors in cold weather, or sexual activity. This medicine may be used for other purposes; ask your health care provider or pharmacist if you have questions. COMMON BRAND  NAME(S): Nitroquick, Nitrostat, Nitrotab What should I tell my health care provider before I take this medicine? They need to know if you have any of these conditions:  anemia  head injury, recent stroke, or bleeding in the brain  liver disease  previous heart attack  an unusual or allergic reaction to nitroglycerin, other medicines, foods, dyes, or preservatives  pregnant or trying to get pregnant  breast-feeding How should I use this medicine? Take this medicine by mouth as needed. At the first sign of an angina attack (chest pain or tightness) place one tablet under your tongue. You can also take this medicine 5 to 10 minutes before an event likely to produce chest pain. Follow the directions on the prescription label. Let the tablet dissolve under the tongue. Do not swallow whole. Replace the dose if you accidentally swallow it. It will help if your mouth is not dry. Saliva around the tablet will help it to dissolve more quickly. Do not eat or drink, smoke or chew tobacco while a tablet is dissolving. If you are not better within 5 minutes after taking ONE dose of nitroglycerin, call 9-1-1 immediately to seek emergency medical care. Do not take more than 3 nitroglycerin tablets over 15 minutes. If you take this medicine often to relieve symptoms of angina, your doctor or health care professional may provide you with different instructions to manage your symptoms. If symptoms do not go away after following these instructions, it is important to call 9-1-1 immediately. Do not take more than 3 nitroglycerin tablets over 15 minutes. Talk to your pediatrician regarding the use of this medicine in children. Special care may be needed. Overdosage: If you think you have taken too much of this medicine contact a poison control center or emergency room at once. NOTE: This medicine is only for you. Do not share this medicine with others. What if I miss a dose? This does not apply. This medicine is  only used as needed. What may interact with this medicine? Do not take this medicine with any of the following medications:  certain migraine medicines like ergotamine and dihydroergotamine (DHE)  medicines used to treat erectile dysfunction like sildenafil, tadalafil, and vardenafil  riociguat This medicine may also interact with the following medications:  alteplase  aspirin  heparin  medicines for high blood pressure  medicines for mental depression  other medicines used to treat angina  phenothiazines like chlorpromazine, mesoridazine, prochlorperazine, thioridazine This list may not describe all possible interactions. Give your health care provider a list of all the medicines, herbs, non-prescription drugs, or dietary supplements you use. Also tell them if you smoke, drink alcohol, or use illegal drugs. Some items may interact with your medicine. What should I watch for while using this medicine? Tell your doctor or health care professional if you feel your medicine is no longer working. Keep this medicine with you at all times. Sit or lie down when you take your medicine to prevent falling if you feel dizzy or faint after using it. Try to remain calm. This will help you to feel better faster. If you feel dizzy, take several deep breaths and lie down with your feet propped up, or bend forward  with your head resting between your knees. You may get drowsy or dizzy. Do not drive, use machinery, or do anything that needs mental alertness until you know how this drug affects you. Do not stand or sit up quickly, especially if you are an older patient. This reduces the risk of dizzy or fainting spells. Alcohol can make you more drowsy and dizzy. Avoid alcoholic drinks. Do not treat yourself for coughs, colds, or pain while you are taking this medicine without asking your doctor or health care professional for advice. Some ingredients may increase your blood pressure. What side effects may  I notice from receiving this medicine? Side effects that you should report to your doctor or health care professional as soon as possible:  blurred vision  dry mouth  skin rash  sweating  the feeling of extreme pressure in the head  unusually weak or tired Side effects that usually do not require medical attention (report to your doctor or health care professional if they continue or are bothersome):  flushing of the face or neck  headache  irregular heartbeat, palpitations  nausea, vomiting This list may not describe all possible side effects. Call your doctor for medical advice about side effects. You may report side effects to FDA at 1-800-FDA-1088. Where should I keep my medicine? Keep out of the reach of children. Store at room temperature between 20 and 25 degrees C (68 and 77 degrees F). Store in [facility] [employer]. Protect from light and moisture. Keep tightly closed. Throw away any unused medicine after the expiration date. NOTE: This sheet is a summary. It may not cover all possible information. If you have questions about this medicine, talk to your doctor, pharmacist, or health care provider.  2020 Elsevier/Gold Standard (2013-06-20 17:57:36)

## 2020-08-14 ENCOUNTER — Ambulatory Visit (HOSPITAL_COMMUNITY)
Admission: RE | Admit: 2020-08-14 | Discharge: 2020-08-14 | Disposition: A | Discharge status: Home / Self Care | Payer: Medicare Other | Source: Ambulatory Visit | Attending: Cardiovascular Disease | Admitting: Cardiovascular Disease

## 2020-08-14 ENCOUNTER — Other Ambulatory Visit: Payer: Self-pay

## 2020-08-14 DIAGNOSIS — R079 Chest pain, unspecified: Secondary | ICD-10-CM | POA: Insufficient documentation

## 2020-08-14 DIAGNOSIS — I5042 Chronic combined systolic (congestive) and diastolic (congestive) heart failure: Secondary | ICD-10-CM | POA: Diagnosis not present

## 2020-08-14 LAB — MYOCARDIAL PERFUSION IMAGING
LV dias vol: 107 mL (ref 62–150)
LV sys vol: 52 mL
Peak HR: 96 {beats}/min
Rest HR: 66 {beats}/min
SDS: 1
SRS: 2
SSS: 3
TID: 0.89

## 2020-08-14 MED ORDER — TECHNETIUM TC 99M TETROFOSMIN IV KIT
10.9000 | PACK | Freq: Once | INTRAVENOUS | Status: AC | PRN
  Administered 2020-08-14: 10.9 via INTRAVENOUS
  Filled 2020-08-14: qty 11

## 2020-08-14 MED ORDER — REGADENOSON 0.4 MG/5ML IV SOLN
0.4000 mg | Freq: Once | INTRAVENOUS | Status: AC
  Administered 2020-08-14: 0.4 mg via INTRAVENOUS

## 2020-08-14 MED ORDER — AMINOPHYLLINE 25 MG/ML IV SOLN
75.0000 mg | Freq: Once | INTRAVENOUS | Status: AC
  Administered 2020-08-14: 75 mg via INTRAVENOUS

## 2020-08-14 MED ORDER — TECHNETIUM TC 99M TETROFOSMIN IV KIT
32.1000 | PACK | Freq: Once | INTRAVENOUS | Status: AC | PRN
  Administered 2020-08-14: 32.1 via INTRAVENOUS
  Filled 2020-08-14: qty 33

## 2020-08-18 ENCOUNTER — Ambulatory Visit (HOSPITAL_COMMUNITY): Payer: Medicare Other | Attending: Cardiology

## 2020-08-18 ENCOUNTER — Other Ambulatory Visit: Payer: Self-pay

## 2020-08-18 DIAGNOSIS — I5042 Chronic combined systolic (congestive) and diastolic (congestive) heart failure: Secondary | ICD-10-CM | POA: Diagnosis not present

## 2020-08-18 DIAGNOSIS — R079 Chest pain, unspecified: Secondary | ICD-10-CM | POA: Insufficient documentation

## 2020-08-18 LAB — ECHOCARDIOGRAM COMPLETE
Area-P 1/2: 2.68 cm2
S' Lateral: 3 cm

## 2020-09-07 ENCOUNTER — Other Ambulatory Visit: Payer: Self-pay | Admitting: Cardiology

## 2020-09-08 ENCOUNTER — Encounter: Payer: Self-pay | Admitting: Pharmacist Clinician (PhC)/ Clinical Pharmacy Specialist

## 2020-09-17 ENCOUNTER — Other Ambulatory Visit: Payer: Self-pay

## 2020-09-17 ENCOUNTER — Ambulatory Visit: Payer: Medicare Other | Admitting: Cardiology

## 2020-09-17 ENCOUNTER — Encounter: Payer: Self-pay | Admitting: Cardiology

## 2020-09-17 VITALS — BP 106/58 | HR 87 | Ht 66.5 in | Wt 168.0 lb

## 2020-09-17 DIAGNOSIS — I2510 Atherosclerotic heart disease of native coronary artery without angina pectoris: Secondary | ICD-10-CM

## 2020-09-17 DIAGNOSIS — Z8616 Personal history of COVID-19: Secondary | ICD-10-CM

## 2020-09-17 DIAGNOSIS — I5042 Chronic combined systolic (congestive) and diastolic (congestive) heart failure: Secondary | ICD-10-CM | POA: Diagnosis not present

## 2020-09-17 DIAGNOSIS — Z789 Other specified health status: Secondary | ICD-10-CM

## 2020-09-17 DIAGNOSIS — Z712 Person consulting for explanation of examination or test findings: Secondary | ICD-10-CM

## 2020-09-17 DIAGNOSIS — E782 Mixed hyperlipidemia: Secondary | ICD-10-CM

## 2020-09-17 NOTE — Progress Notes (Signed)
Cardiology Office Note:    Date:  09/17/2020   ID:  Casey Lynch, DOB 04/16/1946, MRN [id number]  PCP:  Josetta Huddle, MD  Cardiologist:  Buford Dresser, MD  Referring MD: Josetta Huddle, MD   CC: follow up  History of Present Illness:    Casey Lynch is a 75 y.o. male with a hx of with CAD, chronic systolic and diastolic heart failure, ischemic cardiomyopathy, hyperlipidemia, PAD (carotid bruit <50% bilaterally, left subclavian steal syndrome), COPD, former tobacco abuse, OSA on CPAP, type II diabetes. I met him 07/11/2018, and his initial cardiac evaluation from [place] is summarized in that note.  Today: We reviewed the results of his stress test and echocardiogram today. Reassuring. He wonders if the discomfort was Covid related. He has otherwise recovered well.  Rare fatigue/dizziness, about once a week or so. Usually happens at night, can still feel a little dizzy in the morning. Describes as off balance. Feels like he has to walk along furniture/wall. Feels more like a balance problem.  ROS positive for bilateral hand swelling. Trialed on plaquenil, didn't help.  Denies further chest pain, shortness of breath at rest or with normal exertion. No PND, orthopnea, LE edema or unexpected weight gain. No syncope or palpitations.  Past Medical History:  Diagnosis Date  . Carotid bruit   . Diabetes mellitus without complication ([place])   . Eczema   . Heart failure ([place])   . History of colonic polyps   . Hypercholesterolemia   . Hyperlipidemia   . Macular degeneration   . Osteoarthritis   . Paresthesias   . Pseudogout   . Subclavian steal syndrome     Past Surgical History:  Procedure Laterality Date  . CARDIAC CATHETERIZATION    . COLONOSCOPY WITH PROPOFOL N/A 10/24/2016   Procedure: COLONOSCOPY WITH PROPOFOL;  Surgeon: Garlan Fair, MD;  Location: [facility] ENDOSCOPY;  Service: Endoscopy;  Laterality: N/A;  . HYDROCELE EXCISION / REPAIR    . KNEE SURGERY    .  REFRACTIVE SURGERY    . TONSILLECTOMY      Current Medications: Current Outpatient Medications on File Prior to Visit  Medication Sig  . aspirin 81 MG chewable tablet Chew by mouth.  . B Complex Vitamins (VITAMIN B COMPLEX) TABS Take by mouth.  . Cholecalciferol (VITAMIN D3 PO) Take 1 capsule by mouth daily.  . colchicine 0.6 MG tablet Take 0.6 mg by mouth daily.  . CONTOUR NEXT TEST test strip USE TO CHECK BLOOD SUGAR TWICE DAILY AS DIRECTED  . furosemide (LASIX) 20 MG tablet Take 1 tablet by mouth daily or as directed  . hydroxychloroquine (PLAQUENIL) 200 MG tablet Take 200 mg by mouth 2 (two) times daily.  . metFORMIN (GLUCOPHAGE-XR) 500 MG 24 hr tablet Take 1,000 mg by mouth 2 (two) times daily.  . metoprolol succinate (TOPROL-XL) 25 MG 24 hr tablet Take 1 tablet (25 mg total) by mouth daily.  . naproxen sodium (ANAPROX) 220 MG tablet Take 220 mg by mouth 2 (two) times daily as needed (for pain.).  Marland Kitchen nitroGLYCERIN (NITROSTAT) 0.4 MG SL tablet Place 1 tablet (0.4 mg total) under the tongue every 5 (five) minutes as needed for chest pain.  Marland Kitchen REPATHA SURECLICK 144 MG/ML SOAJ ADMINISTER 1 ML(140MG) UNDER THE SKIN EVERY 14 DAYS  . sacubitril-valsartan (ENTRESTO) 97-103 MG Take 1 tablet by mouth 2 (two) times daily.  . Turmeric Curcumin 500 MG CAPS Take by mouth.   No current facility-administered medications on file prior to visit.  Allergies:   Statins and Tetanus toxoids   Social History   Tobacco Use  . Smoking status: Former Smoker    Packs/day: 1.00    Years: 59.00    Pack years: 59.00    Types: Cigarettes    Quit date: 09/05/2016    Years since quitting: 4.0  . Smokeless tobacco: Never Used  Vaping Use  . Vaping Use: Never used  Substance Use Topics  . Alcohol use: Yes    Alcohol/week: 0.0 standard drinks    Comment: Rare  . Drug use: No    Family History: family history includes Bipolar disorder in his brother; Cancer in his paternal grandmother; Diabetes in his  brother; Heart failure in his father.  ROS:   Please see the history of present illness.  Additional pertinent ROS otherwise unremarkable.  EKGs/Labs/Other Studies Reviewed:    The following studies were reviewed today: Echo 08/18/20 1. Left ventricular ejection fraction, by estimation, is 50 to 55%. The  left ventricle has low normal function. The left ventricle has no regional  wall motion abnormalities. Left ventricular diastolic parameters are  consistent with Grade I diastolic  dysfunction (impaired relaxation).  2. Right ventricular systolic function is normal. The right ventricular  size is mildly enlarged.  3. The mitral valve is normal in structure. Trivial mitral valve  regurgitation. No evidence of mitral stenosis.  4. The aortic valve is tricuspid. Aortic valve regurgitation is not  visualized. No aortic stenosis is present.  5. The inferior vena cava is normal in size with greater than 50%  respiratory variability, suggesting right atrial pressure of 3 mmHg.  Stress test 08/14/20  Nuclear stress EF: 51%.  The left ventricular ejection fraction is mildly decreased (45-54%).  There was no ST segment deviation noted during stress.  Findings consistent with prior myocardial infarction.  This is a low risk study.   Small inferior basal wall infarct no ischemia EF 51%  Echo 02/19/19 1. The left ventricle has mild-moderately reduced systolic function, with an ejection fraction of 40-45%. The cavity size was normal. Left ventricular diastolic Doppler parameters are consistent with impaired relaxation. Left ventricular diffuse  hypokinesis, with slightly worse function in the lateral wall. 2. The right ventricle has normal systolic function. The cavity was normal. There is no increase in right ventricular wall thickness. Right ventricular systolic pressure could not be assessed. 3. The aortic valve is tricuspid. Aortic valve regurgitation is trivial by color flow  Doppler. No stenosis of the aortic valve. 4. Cannot exclude small PFO by color flow Doppler. 5. There is redundancy of the interatrial septum.  EKG:  EKG is personally reviewed.  The ekg ordered 07/02/20 demonstrates NSR, RBBB at 77 bpm  Recent Labs: 12/24/2019: BUN 22; Creatinine, Ser 1.22; Potassium 5.4; Sodium 138  Recent Lipid Panel    Component Value Date/Time   CHOL 120 12/24/2019 1605   TRIG 277 (H) 12/24/2019 1605   HDL 48 12/24/2019 1605   CHOLHDL 2.5 12/24/2019 1605   LDLCALC 31 12/24/2019 1605    Physical Exam:    VS:  BP (!) 106/58   Pulse 87   Ht 5' 6.5" (1.689 m)   Wt 168 lb (76.2 kg)   SpO2 98%   BMI 26.71 kg/m     Wt Readings from Last 3 Encounters:  09/17/20 168 lb (76.2 kg)  08/14/20 159 lb (72.1 kg)  08/13/20 159 lb 3.2 oz (72.2 kg)   GEN: Well nourished, well developed in no acute distress HEENT:  Normal, moist mucous membranes NECK: No JVD CARDIAC: regular rhythm, normal S1 and S2, no rubs or gallops. No murmur. VASCULAR: Radial and DP pulses 2+ bilaterally. No carotid bruits RESPIRATORY:  Clear to auscultation without rales, wheezing or rhonchi  ABDOMEN: Soft, non-tender, non-distended MUSCULOSKELETAL:  Ambulates independently SKIN: Warm and dry, no edema in lower extremities. Mild bilateral hand swelling NEUROLOGIC:  Alert and oriented x 3. No focal neuro deficits noted. PSYCHIATRIC:  Normal affect   ASSESSMENT:    1. Personal history of COVID-19   2. Encounter to discuss test results   3. Chronic combined systolic and diastolic heart failure ([place])   4. Coronary artery disease involving native coronary artery of native heart without angina pectoris   5. Mixed hyperlipidemia   6. Statin intolerance    PLAN:    Recent Covid infection Chest discomfort -now improved -echo, stress test reviewed. 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 He tried [place] briefly but felt it didn't help his sugars, and he was urinating a lot. His urination did not change significantly after stopping, and he has drastically improved his diabetes control with weight loss. Would like to re-evaluate based on cost in the future. Discussed that he may be able to cut back on lasix/glimeperide with this. Continue to assess.  CAD (predominantly RCA, but diffuse mild disease) -on aspirin 81 mg -avoid long term NSAIDs given CAD -lipids as below -discussed red flag warning signs that need immediate medical attention, such as chest tightness that does not resolve with rest  Mixed hyperlipidemia, statin intolerance: -LDL goal <70 -doing well on repatha, last LDL 31 -could not afford vascepa at >$500 -last TG 277, but focusing on lifestyle/diabetes control. Continue to monitor  Type II diabetes, was on long term insulin and now off of this. -4/2021A1c 7.2 -see above, he does not think SGLT2i affected him much, would retrial in future if able. -has neuropathy as diabetic complication  Arm/hand swelling: trialed on plaquenil without improvement.  Cardiologist in [place]: Dr Newt Minion fax [phone]  Cardiac risk counseling and prevention recommendations: -recommend heart healthy/Mediterranean diet, with whole grains, fruits, vegetable, fish, lean meats, nuts, and olive oil. Limit salt. -recommend moderate walking, 3-5 times/week for 30-50 minutes each session. Aim for at least 150 minutes.week. Goal should be pace of 3 miles/hours, or walking 1.5 miles in 30 minutes -recommend avoidance of tobacco products. Avoid excess alcohol.  Plan for follow up: 6 mos or sooner as needed (planning to travel to [place] for a time in  spring)  Buford Dresser, MD, PhD, [facility]   Medication Adjustments/Labs and Tests Ordered: Current medicines are reviewed at length with the patient today.  Concerns regarding medicines are outlined above.  No orders of the defined types were placed in this encounter.  No orders of the defined types were placed in this encounter.   Patient Instructions  Recommend Covid antigen test (not antibody) prior to travel to [place]. Antigen test should be accurate for infection.   Follow-Up: At [facility], you and your health needs are our priority.  As part of our continuing mission to provide you with exceptional heart care, we have created designated Provider Care Teams.  These Care Teams include your  primary Cardiologist (physician) and Advanced Practice Providers (APPs -  Physician Assistants and Nurse Practitioners) who all work together to provide you with the care you need, when you need it.  We recommend signing up for the patient portal called "MyChart".  Sign up information is provided on this After Visit Summary.  MyChart is used to connect with patients for Virtual Visits (Telemedicine).  Patients are able to view lab/test results, encounter notes, upcoming appointments, etc.  Non-urgent messages can be sent to your provider as well.   To learn more about what you can do with MyChart, go to [email or web address].    Your next appointment:   6 month(s)  The format for your next appointment:   In Person  Provider:   Buford Dresser, MD     Signed, Buford Dresser, MD PhD 09/17/2020    [place]

## 2020-09-17 NOTE — Patient Instructions (Addendum)
Recommend Covid antigen test (not antibody) prior to travel to [place]. Antigen test should be accurate for infection.   Follow-Up: At [facility], you and your health needs are our priority.  As part of our continuing mission to provide you with exceptional heart care, we have created designated Provider Care Teams.  These Care Teams include your primary Cardiologist (physician) and Advanced Practice Providers (APPs -  Physician Assistants and Nurse Practitioners) who all work together to provide you with the care you need, when you need it.  We recommend signing up for the patient portal called "MyChart".  Sign up information is provided on this After Visit Summary.  MyChart is used to connect with patients for Virtual Visits (Telemedicine).  Patients are able to view lab/test results, encounter notes, upcoming appointments, etc.  Non-urgent messages can be sent to your provider as well.   To learn more about what you can do with MyChart, go to [email or web address].    Your next appointment:   6 month(s)  The format for your next appointment:   In Person  Provider:   Buford Dresser, MD

## 2020-09-20 ENCOUNTER — Encounter: Payer: Self-pay | Admitting: Cardiology

## 2020-09-28 ENCOUNTER — Encounter: Payer: Self-pay | Admitting: Family Medicine

## 2020-10-01 ENCOUNTER — Other Ambulatory Visit: Payer: Self-pay

## 2020-10-01 ENCOUNTER — Encounter: Payer: Self-pay | Admitting: Family Medicine

## 2020-10-01 ENCOUNTER — Ambulatory Visit (INDEPENDENT_AMBULATORY_CARE_PROVIDER_SITE_OTHER): Payer: Medicare Other | Admitting: Family Medicine

## 2020-10-01 VITALS — BP 118/70 | HR 76 | Temp 97.9°F | Ht 66.5 in | Wt 168.8 lb

## 2020-10-01 DIAGNOSIS — Z1159 Encounter for screening for other viral diseases: Secondary | ICD-10-CM

## 2020-10-01 DIAGNOSIS — E119 Type 2 diabetes mellitus without complications: Secondary | ICD-10-CM | POA: Diagnosis not present

## 2020-10-01 DIAGNOSIS — I5042 Chronic combined systolic (congestive) and diastolic (congestive) heart failure: Secondary | ICD-10-CM | POA: Diagnosis not present

## 2020-10-01 DIAGNOSIS — E782 Mixed hyperlipidemia: Secondary | ICD-10-CM | POA: Diagnosis not present

## 2020-10-01 DIAGNOSIS — E1165 Type 2 diabetes mellitus with hyperglycemia: Secondary | ICD-10-CM | POA: Insufficient documentation

## 2020-10-01 DIAGNOSIS — IMO0002 Reserved for concepts with insufficient information to code with codable children: Secondary | ICD-10-CM | POA: Insufficient documentation

## 2020-10-01 DIAGNOSIS — M1189 Other specified crystal arthropathies, multiple sites: Secondary | ICD-10-CM | POA: Insufficient documentation

## 2020-10-01 LAB — COMPREHENSIVE METABOLIC PANEL
ALT: 17 U/L (ref 0–53)
AST: 18 U/L (ref 0–37)
Albumin: 4.4 g/dL (ref 3.5–5.2)
Alkaline Phosphatase: 88 U/L (ref 39–117)
BUN: 28 mg/dL — ABNORMAL HIGH (ref 6–23)
CO2: 28 mEq/L (ref 19–32)
Calcium: 10.8 mg/dL — ABNORMAL HIGH (ref 8.4–10.5)
Chloride: 98 mEq/L (ref 96–112)
Creatinine, Ser: 1.19 mg/dL (ref 0.40–1.50)
GFR: 59.78 mL/min — ABNORMAL LOW (ref >60.00)
Glucose, Bld: 255 mg/dL — ABNORMAL HIGH (ref 70–99)
Potassium: 4.5 mEq/L (ref 3.5–5.1)
Sodium: 134 mEq/L — ABNORMAL LOW (ref 135–145)
Total Bilirubin: 0.3 mg/dL (ref 0.2–1.2)
Total Protein: 7.9 g/dL (ref 6.0–8.3)

## 2020-10-01 LAB — CBC WITH DIFFERENTIAL/PLATELET
Basophils Absolute: 0.2 10*3/uL — ABNORMAL HIGH (ref 0.0–0.1)
Basophils Relative: 1.8 % (ref 0.0–3.0)
Eosinophils Absolute: 1.1 10*3/uL — ABNORMAL HIGH (ref 0.0–0.7)
Eosinophils Relative: 12.8 % — ABNORMAL HIGH (ref 0.0–5.0)
HCT: 37.3 % — ABNORMAL LOW (ref 39.0–52.0)
Hemoglobin: 12.4 g/dL — ABNORMAL LOW (ref 13.0–17.0)
Lymphocytes Relative: 16 % (ref 12.0–46.0)
Lymphs Abs: 1.4 10*3/uL (ref 0.7–4.0)
MCHC: 33.3 g/dL (ref 30.0–36.0)
MCV: 88.4 fl (ref 78.0–100.0)
Monocytes Absolute: 0.7 10*3/uL (ref 0.1–1.0)
Monocytes Relative: 7.9 % (ref 3.0–12.0)
Neutro Abs: 5.3 10*3/uL (ref 1.4–7.7)
Neutrophils Relative %: 61.5 % (ref 43.0–77.0)
Platelets: 384 10*3/uL (ref 150.0–400.0)
RBC: 4.21 Mil/uL — ABNORMAL LOW (ref 4.22–5.81)
RDW: 13.9 % (ref 11.5–15.5)
WBC: 8.6 10*3/uL (ref 4.0–10.5)

## 2020-10-01 LAB — MICROALBUMIN / CREATININE URINE RATIO
Creatinine,U: 26.1 mg/dL
Microalb Creat Ratio: 2.7 mg/g (ref 0.0–30.0)
Microalb, Ur: <0.7 mg/dL (ref 0.0–1.9)

## 2020-10-01 LAB — HEMOGLOBIN A1C: Hgb A1c MFr Bld: 8.6 % — ABNORMAL HIGH (ref 4.6–6.5)

## 2020-10-01 LAB — TSH: TSH: 4.47 u[IU]/mL (ref 0.35–4.50)

## 2020-10-01 NOTE — Progress Notes (Signed)
Patient: Casey Lynch MRN: [id number] DOB: [birth year] PCP: Orma Flaming, MD     Subjective:  Chief Complaint  Patient presents with  . Establish Care  . Congestive Heart Failure  . Diabetes    HPI: The patient is a 75 y.o. male who presents today for establishing care.   Diabetes: Patient is here for follow up of type 2 diabetes.   Currently on the following medications metformin 1000mg  BID and amaryl 4mg /day. Takes medications as prescribed. Last A1C was 8.1. Currently exercising and NOT following a diabetic diet.  Denies any hypoglycemic events. Denies any vision changes, nausea, vomiting, abdominal pain, ulcers, polyuria, polydipsia or polyphagia. Denies any chest pain, shortness of breath. 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 He doesn't remember if he tolerated gabapentin.   Hyperlipidemia He is on repatha as he can not tolerate statins. Last lipid panel done in 4/21 with LDL of 31/total cholesterol of 120.   CHF Recent echo in 12/21. EF up to 50-55%. Followed by cardiology, Dr. Harrell Gave. ON lasix, beta blocker, entresto. Euvolemic today.   Also is on baby aspirin, colchicine and naproxen daily. Discussed multiple NSAIDs/risks.   No flu shot x 3 months from receiving monoclonal antibody infusion for covid.    Review of Systems  Constitutional: Negative for chills, fatigue and fever.  HENT: Negative for dental problem, ear pain, hearing loss and trouble swallowing.   Eyes: Negative for visual disturbance.  Respiratory: Negative for cough, chest tightness and shortness of breath.   Cardiovascular: Negative for chest pain, palpitations and leg swelling.  Gastrointestinal: Negative for abdominal pain, blood in stool, diarrhea and nausea.  Endocrine: Negative for cold intolerance, polydipsia, polyphagia and polyuria.  Genitourinary: Negative for dysuria and hematuria.  Musculoskeletal: Negative for arthralgias.  Skin: Negative for rash.  Neurological: Negative for  dizziness and headaches.       Neuropathy in bilateral feet   Psychiatric/Behavioral: Negative for dysphoric mood and sleep disturbance. The patient is not nervous/anxious.     Allergies Patient is allergic to statins and tetanus toxoids.  Past Medical History Patient  has a past medical history of Carotid bruit, Diabetes mellitus without complication ([place]), Eczema, Heart failure ([place]), History of colonic polyps, Hypercholesterolemia, Hyperlipidemia, Macular degeneration, Osteoarthritis, Paresthesias, Pseudogout, and Subclavian steal syndrome.  Surgical History Patient  has a past surgical history that includes Tonsillectomy; Colonoscopy with propofol (N/A, 10/24/2016); Knee surgery; Hydrocele excision / repair; Refractive surgery; and Cardiac catheterization.  Family History Pateint's family history includes Bipolar disorder in his brother; Cancer in his paternal grandmother; Diabetes in his brother; Heart failure in his father.  Social History Patient  reports that he quit smoking about 4 years ago. His smoking use included cigarettes. He has a 59.00 pack-year smoking history. He has never used smokeless tobacco. He reports current alcohol use. He reports that he does not use drugs.    Objective: Vitals:   10/01/20 0840  BP: 118/70  Pulse: 76  Temp: 97.9 F (36.6 C)  TempSrc: Temporal  SpO2: 98%  Weight: 168 lb 12.8 oz (76.6 kg)  Height: 5' 6.5" (1.689 m)    Body mass index is 26.84 kg/m.  Physical Exam Vitals reviewed.  Constitutional:      Appearance: Normal appearance. He is well-developed, normal weight and well-nourished.  HENT:     Head: Normocephalic and atraumatic.     Right Ear: External ear normal.     Left Ear: External ear normal.     Mouth/Throat:  Mouth: Oropharynx is clear and moist.  Eyes:     Extraocular Movements: EOM normal.     Conjunctiva/sclera: Conjunctivae normal.     Pupils: Pupils are equal, round, and reactive to light.  Neck:      Thyroid: No thyromegaly.     Vascular: No carotid bruit.  Cardiovascular:     Rate and Rhythm: Normal rate and regular rhythm.     Pulses: Intact distal pulses.     Heart sounds: Normal heart sounds. No murmur heard.   Pulmonary:     Effort: Pulmonary effort is normal.     Breath sounds: Normal breath sounds.  Abdominal:     General: Bowel sounds are normal. There is no distension.     Palpations: Abdomen is soft.     Tenderness: There is no abdominal tenderness.  Musculoskeletal:     Cervical back: Normal range of motion and neck supple.  Lymphadenopathy:     Cervical: No cervical adenopathy.  Skin:    General: Skin is warm and dry.     Capillary Refill: Capillary refill takes less than 2 seconds.     Findings: No rash.  Neurological:     Mental Status: He is alert and oriented to person, place, and time.     Cranial Nerves: No cranial nerve deficit.     Coordination: Coordination normal.     Deep Tendon Reflexes: Reflexes normal.  Psychiatric:        Mood and Affect: Mood and affect and mood normal.        Behavior: Behavior normal.        Assessment/plan: 1. Controlled type 2 diabetes mellitus without complication, without long-term current use of insulin ([place]) -on metformin and amaryl. Would like to stop the amaryl and use a newer drug. Has done jardiance in the past with not much effect on his A1C.  -GLP-1 inhibitor may be ideal. He has no hx of medullary thyroid cancer or MEN syndrome. Discussed side effects of medication, risks/benefits. Discussed animal studies show thyroid cancer, but not seen in humans. He is to let me know if any issues swallowing, lump on throat, voice changes. He will check with insurance to see which GLP- 1 drug is covered. Continue metformin and watch kidney's closely with entresto/lasix/NSAIDs.  -has had vaccines.  -LDL to goal on repatha -needs eye exam.  - Comprehensive metabolic panel - CBC with Differential/Platelet -will see him back  in 2 months before they leave for [place].  - Hemoglobin A1c - TSH - Microalbumin / creatinine urine ratio  2. Encounter for hepatitis C screening test for low risk patient  - Hepatitis C antibody  3. Chronic combined systolic and diastolic heart failure (HCC) Euvolemic. On entresto, lasix, beta blocker. Last EF of 50-55%. Followed by cardiology.   4. Mixed hyperlipidemia LDL of 34! Continue repatha.   Had discussed about NSAID use. On daily colchicine. Advised he not take aleve twice a day and just take at night if needed. Discussed risks of gastric ulcer, kidney damage etc. Would try tylenol, but he states this does nothing for his pain. Will check renal function today and would watch closely.   Requesting records as well.   Total time of encounter: 55 minutes total time of encounter, including 45 minutes spent in face-to-face patient care. This time includes coordination of care and counseling regarding chronic conditions, treatment, chart review, labs/follow up. Remainder of non-face-to-face time involved reviewing chart documents/testing relevant to the patient encounter and documentation in  the medical record.    Return in about 2 months (around 11/29/2020) for diabetes f/u and cholesterol. Orma Flaming, MD [place]   10/01/2020

## 2020-10-01 NOTE — Patient Instructions (Addendum)
-  drugs I want you to look into.Marland Kitchen  1) GLP-1 inhibitors for a1c reduction.  Trulicity, ozempic or rybelsus.   Let me know what they cover. If we decide to do this we will stop your amaryl and start this.   -trial of gabapentin for shooting pains at night. Tech. You can take three times a day. Start at 300mg /dosage.   Would not take aleve more than once a day. Increased risk of stomach ulcer, kidney issues with all of the NSAIDs you are on...    See you back before you leave for [place]!  So nice to meet you!  Dr. Rogers Blocker

## 2020-10-02 LAB — HEPATITIS C ANTIBODY
Hepatitis C Ab: NONREACTIVE
SIGNAL TO CUT-OFF: 0.02 (ref <1.00)

## 2020-10-03 ENCOUNTER — Other Ambulatory Visit: Payer: Self-pay | Admitting: Cardiology

## 2020-10-05 ENCOUNTER — Encounter: Payer: Self-pay | Admitting: Family Medicine

## 2020-10-06 ENCOUNTER — Other Ambulatory Visit: Payer: Self-pay

## 2020-10-06 MED ORDER — COLCHICINE 0.6 MG PO TABS: 0.6000 mg | ORAL_TABLET | Freq: Every day | ORAL | 0 refills | Status: AC

## 2020-10-08 ENCOUNTER — Encounter: Payer: Self-pay | Admitting: Family Medicine

## 2020-10-08 MED ORDER — TRULICITY 0.75 MG/0.5ML ~~LOC~~ SOAJ: 0.7500 mg | SUBCUTANEOUS | 1 refills | Status: DC

## 2020-10-13 IMAGING — CT CT CHEST LUNG CANCER SCREENING LOW DOSE W/O CM
2 of 4 series · 15 of 36 positions shown, 18 images · non-contrast
Comparison: 04/03/2019

CLINICAL DATA: Fifty-one pack-year smoking history. Quit 2 years
ago.

EXAM:
CT CHEST WITHOUT CONTRAST LOW-DOSE FOR LUNG CANCER SCREENING
TECHNIQUE: Multidetector CT imaging of the chest was performed following the
standard protocol without IV contrast.

[Series 3: lung thins 1.0 · axial · 0.74mm/px · z∈[-305,-25]mm · 12 of 308 slices shown, 15 images]
[im 14/308  mediastinal]
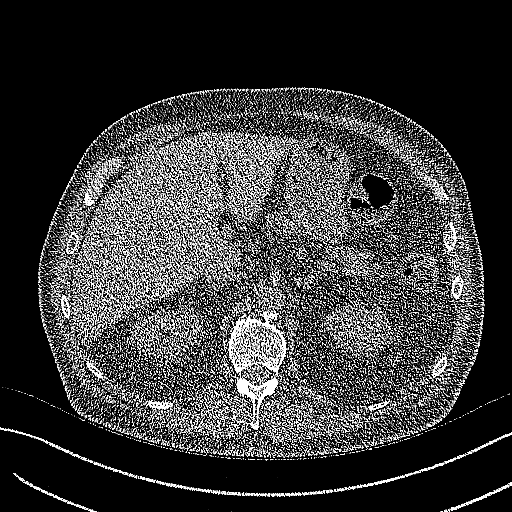
[im 14/308  lung]
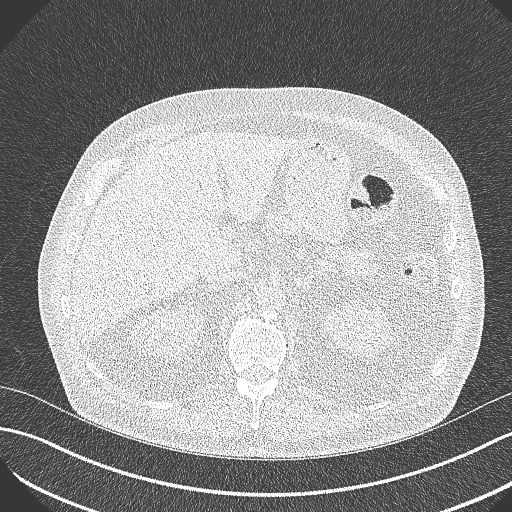
[im 42/308  lung]
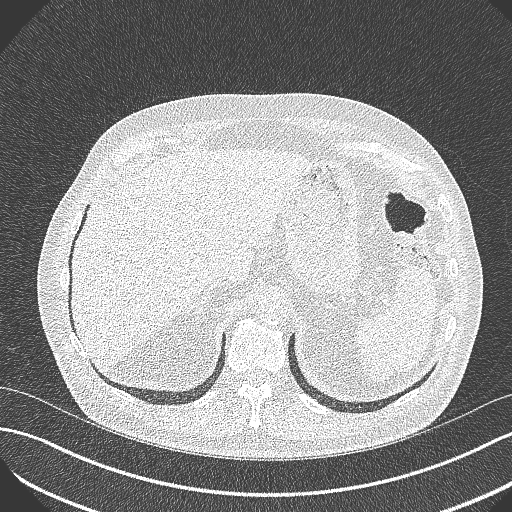
[im 70/308  lung]
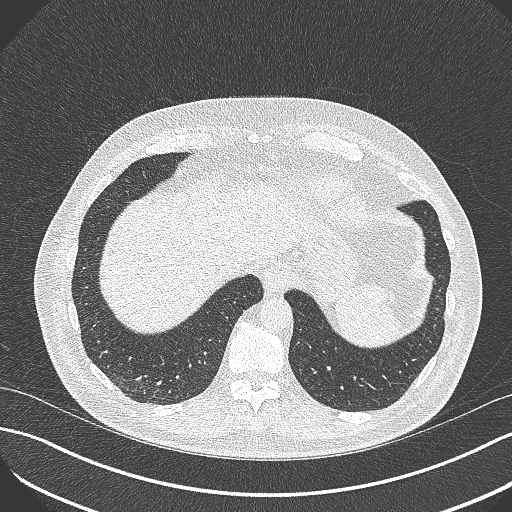
[im 98/308  lung]
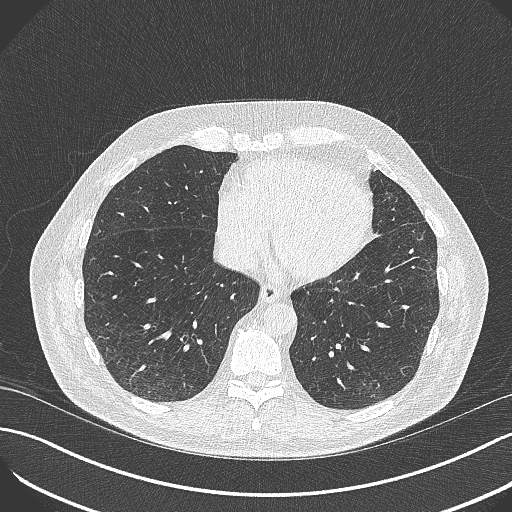
[im 112/308  mediastinal]
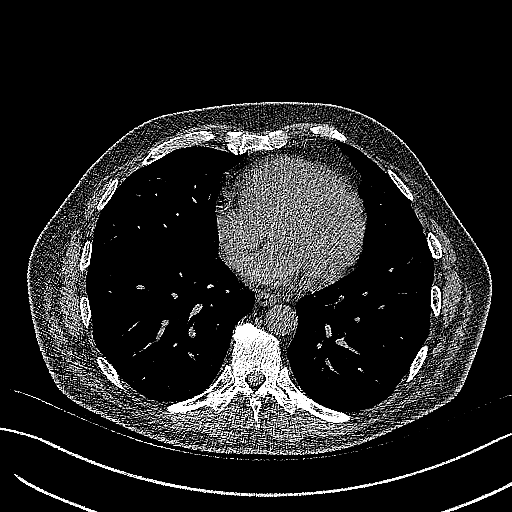
[im 112/308  lung]
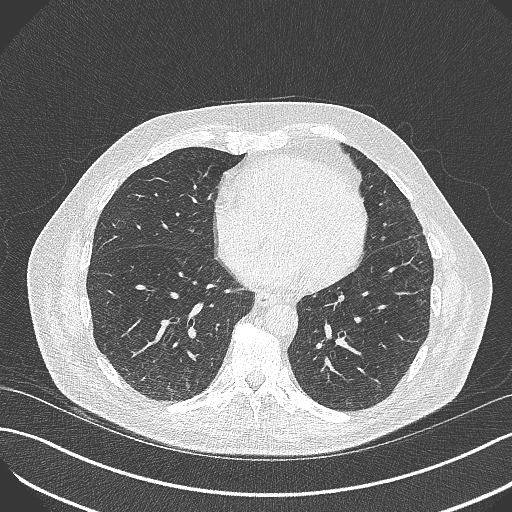
[im 140/308  lung]
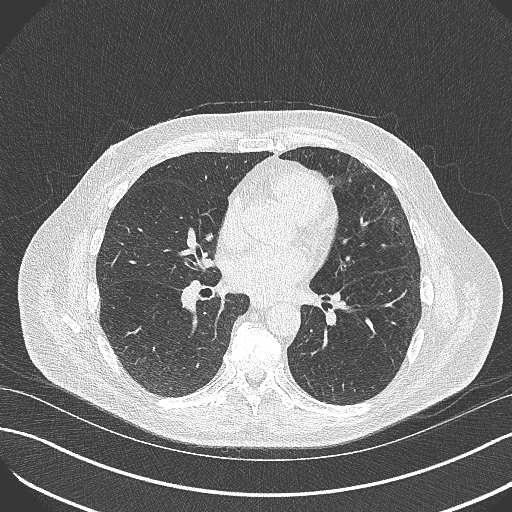
[im 168/308  lung]
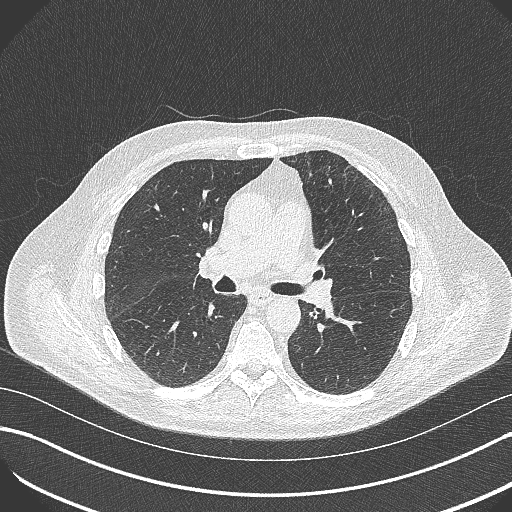
[im 196/308  lung]
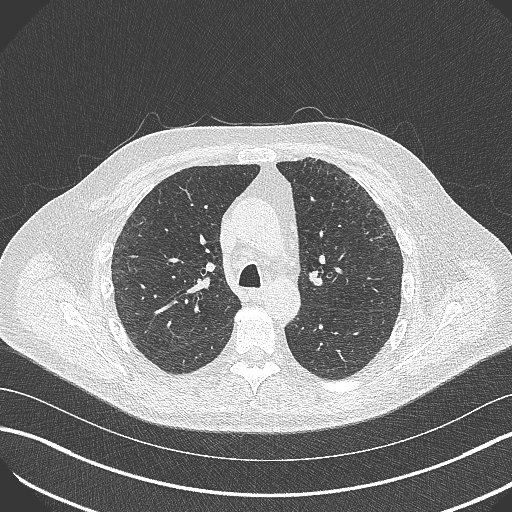
[im 210/308  mediastinal]
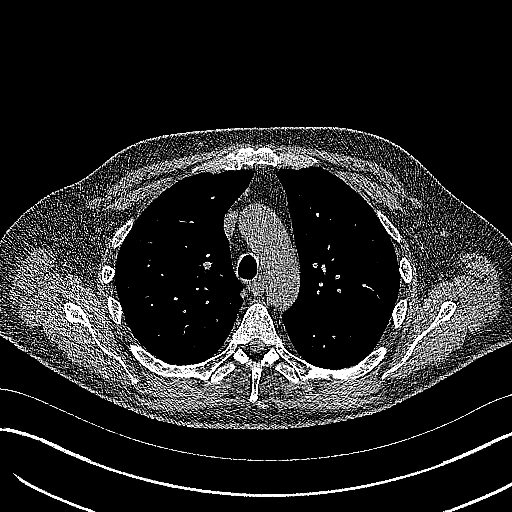
[im 210/308  lung]
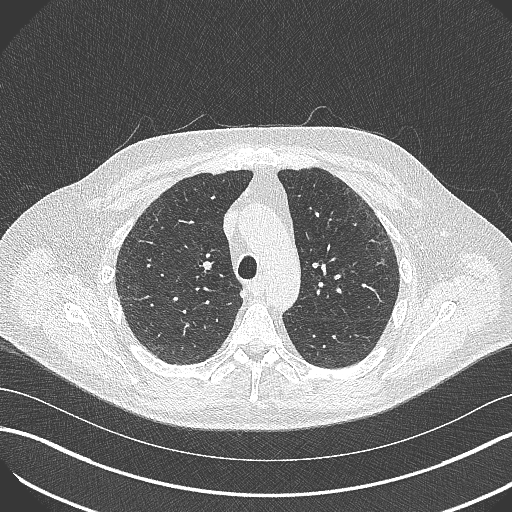
[im 238/308  lung]
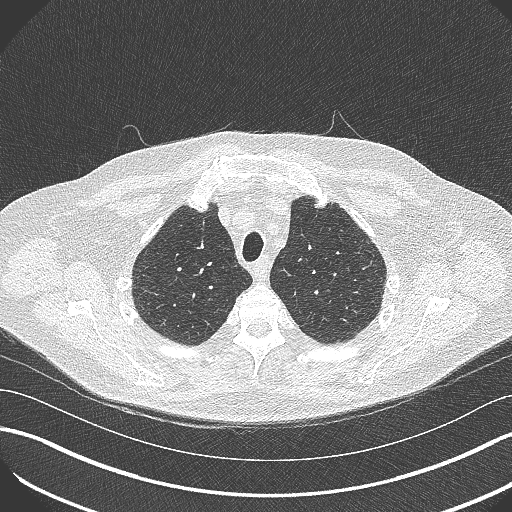
[im 266/308  lung]
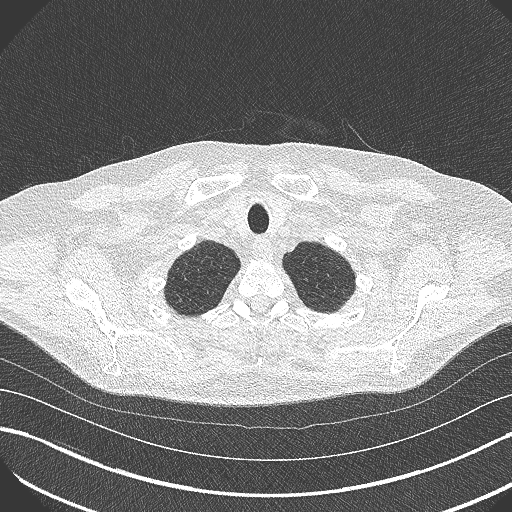
[im 294/308  lung]
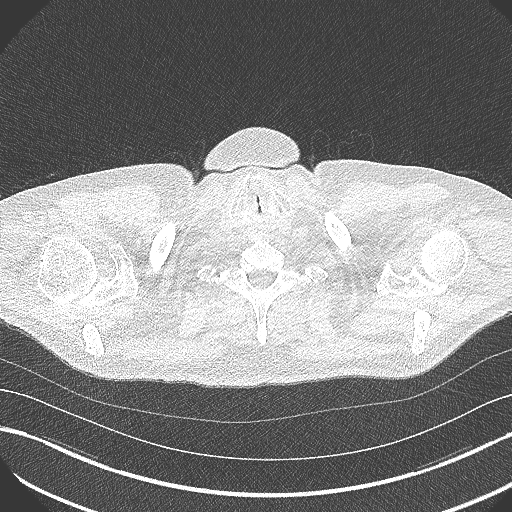

[Series 5: coronal · coronal · 0.61mm/px · 3 of 120 slices shown]
[im 24/120  lung]
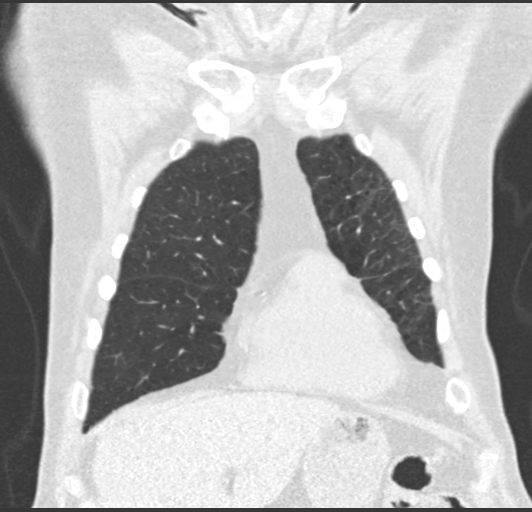
[im 48/120  lung]
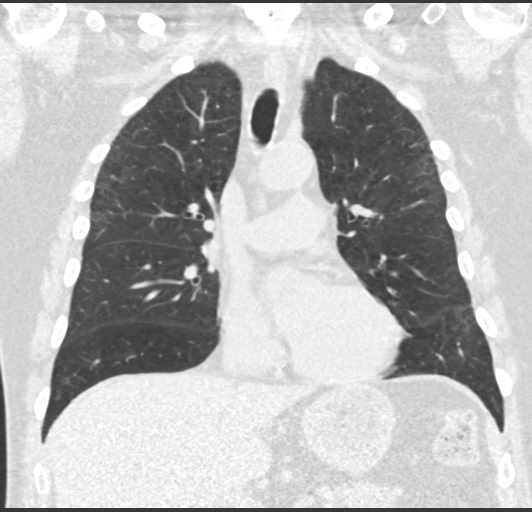
[im 72/120  lung]
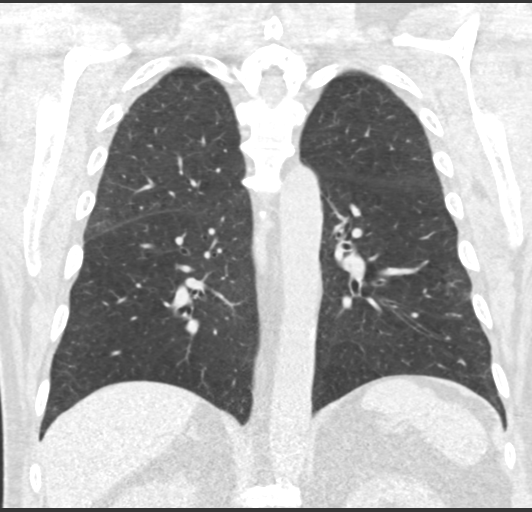

[15 of 36 positions shown; findings below may reference images not displayed]

FINDINGS: Cardiovascular: Aortic atherosclerosis. Normal heart size, without
pericardial effusion. Multivessel coronary artery atherosclerosis.

Mediastinum/Nodes: No mediastinal or definite hilar adenopathy,
given limitations of unenhanced CT.

Lungs/Pleura: No pleural fluid. Mild centrilobular emphysema. No
change in a tiny left upper lobe solid and a right lower lobe
calcified pulmonary nodule. No enlarging or suspicious pulmonary
nodules.

Upper Abdomen: High left hepatic lobe cyst. Normal imaged portions
of the spleen, stomach, pancreas, adrenal glands, kidneys.

Musculoskeletal: No acute osseous abnormality.
IMPRESSION: 1. Lung-RADS 2, benign appearance or behavior. Continue annual
screening with low-dose chest CT without contrast in 12 months.
2. Aortic Atherosclerosis (K60J3-P9N.N) and Emphysema (K60J3-RBD.A).
Coronary artery atherosclerosis.

## 2020-10-14 ENCOUNTER — Encounter: Payer: Self-pay | Admitting: Family Medicine

## 2020-10-14 NOTE — Telephone Encounter (Signed)
Patient picked up samples today.

## 2020-10-14 NOTE — Telephone Encounter (Signed)
Noted  

## 2020-10-25 DIAGNOSIS — Z8616 Personal history of COVID-19: Secondary | ICD-10-CM | POA: Insufficient documentation

## 2020-10-27 ENCOUNTER — Encounter: Payer: Self-pay | Admitting: Family Medicine

## 2020-10-28 ENCOUNTER — Encounter: Payer: Self-pay | Admitting: Family Medicine

## 2020-10-28 ENCOUNTER — Ambulatory Visit (INDEPENDENT_AMBULATORY_CARE_PROVIDER_SITE_OTHER): Payer: Medicare Other | Admitting: Family Medicine

## 2020-10-28 ENCOUNTER — Other Ambulatory Visit: Payer: Self-pay

## 2020-10-28 VITALS — BP 104/68 | HR 65 | Temp 98.2°F | Ht 66.5 in | Wt 165.8 lb

## 2020-10-28 DIAGNOSIS — E1165 Type 2 diabetes mellitus with hyperglycemia: Secondary | ICD-10-CM | POA: Diagnosis not present

## 2020-10-28 DIAGNOSIS — K219 Gastro-esophageal reflux disease without esophagitis: Secondary | ICD-10-CM

## 2020-10-28 NOTE — Patient Instructions (Signed)
-  let's check for h.pylori.  -your steroids or trulicity can cause this.  -lets try trulicity one more week and if continues can stop this and we can try the pill form.   Can take protonix after this!

## 2020-10-28 NOTE — Progress Notes (Signed)
Patient: Casey Lynch MRN: [id number] DOB: 10-31-1945 PCP: Orma Flaming, MD     Subjective:  Chief Complaint  Patient presents with  . Nausea  . Medication Problem    HPI: The patient is a 75 y.o. male who presents today for nausea, and symptoms of GERD. Pt recently started Trulicity. He has had symptoms x 2 weeks. He also started a prednisone taper around the same time. (10/09/20). He started trulicity on 10/11/03. He denies any stomach pain. He does take colchicine daily. No other nsaid. He has not taken any protonix or other medication for GERD. He will occasionally take a tums. He has not traveled overseas, no well water, he does not drink alcohol. He does drink a lot of coffee. He has had h.pylori in the past.   He has never had any issues in the past with prednisone.    Review of Systems  Constitutional: Negative for chills, fatigue and fever.  HENT: Negative for dental problem, ear pain, hearing loss and trouble swallowing.   Eyes: Negative for visual disturbance.  Respiratory: Negative for cough, chest tightness and shortness of breath.   Cardiovascular: Negative for chest pain, palpitations and leg swelling.  Gastrointestinal: Positive for nausea. Negative for abdominal pain, blood in stool and diarrhea.  Endocrine: Negative for cold intolerance, polydipsia, polyphagia and polyuria.  Genitourinary: Negative for dysuria and hematuria.  Musculoskeletal: Negative for arthralgias.  Skin: Negative for rash.  Neurological: Negative for dizziness and headaches.  Psychiatric/Behavioral: Negative for dysphoric mood and sleep disturbance. The patient is not nervous/anxious.     Allergies Patient is allergic to statins and tetanus toxoids.  Past Medical History Patient  has a past medical history of Carotid bruit, Diabetes mellitus without complication ([place]), Eczema, Heart failure ([place]), History of colonic polyps, Hypercholesterolemia, Hyperlipidemia, Macular degeneration,  Osteoarthritis, Paresthesias, Pseudogout, and Subclavian steal syndrome.  Surgical History Patient  has a past surgical history that includes Tonsillectomy; Colonoscopy with propofol (N/A, 10/24/2016); Knee surgery; Hydrocele excision / repair; Refractive surgery; and Cardiac catheterization.  Family History Pateint's family history includes Bipolar disorder in his brother; Cancer in his paternal grandmother; Diabetes in his brother; Heart failure in his father.  Social History Patient  reports that he quit smoking about 4 years ago. His smoking use included cigarettes. He has a 59.00 pack-year smoking history. He has never used smokeless tobacco. He reports current alcohol use. He reports that he does not use drugs.    Objective: Vitals:   10/28/20 1113  BP: 104/68  Pulse: 65  Temp: 98.2 F (36.8 C)  TempSrc: Temporal  SpO2: 96%  Weight: 165 lb 12.8 oz (75.2 kg)  Height: 5' 6.5" (1.689 m)    Body mass index is 26.36 kg/m.  Physical Exam Constitutional:      General: He is not in acute distress.    Appearance: Normal appearance. He is obese. He is not ill-appearing.  HENT:     Head: Normocephalic and atraumatic.  Cardiovascular:     Rate and Rhythm: Normal rate and regular rhythm.     Heart sounds: Normal heart sounds.  Pulmonary:     Effort: Pulmonary effort is normal.     Breath sounds: Normal breath sounds.  Abdominal:     General: Bowel sounds are normal.     Palpations: Abdomen is soft.     Tenderness: There is no abdominal tenderness.  Skin:    Capillary Refill: Capillary refill takes less than 2 seconds.  Neurological:     General:  No focal deficit present.     Mental Status: He is alert and oriented to person, place, and time.  Psychiatric:        Mood and Affect: Mood normal.        Behavior: Behavior normal.        Assessment/plan: 1. Uncontrolled type 2 diabetes mellitus with hyperglycemia (HCC) Nausea likely due to trulicity, but they would like to  give it one more week since off prednisone now. Discussed prednisone can make reflux/gastritis worse and still on differential, but he has never had any issues in the past. If nausea continues we will stop this and do trial of rybelsus but still may run into same side effects. He is coming back in march for a1c check as well.   2. Gastroesophageal reflux disease, unspecified whether esophagitis present -check for h.pylori and can start his protonix following this test. Has not been taking. trulicity vs. Prednisone high on differential. Discussed diet as well. Will see him back in a few weeks.  - H. pylori breath test    This visit occurred during the SARS-CoV-2 public health emergency.  Safety protocols were in place, including screening questions prior to the visit, additional usage of staff PPE, and extensive cleaning of exam room while observing appropriate contact time as indicated for disinfecting solutions.     Return if symptoms worsen or fail to improve.   Orma Flaming, MD [place]   10/28/2020

## 2020-10-29 LAB — H. PYLORI BREATH TEST: H. pylori Breath Test: NOT DETECTED

## 2020-11-02 DIAGNOSIS — M0609 Rheumatoid arthritis without rheumatoid factor, multiple sites: Secondary | ICD-10-CM | POA: Diagnosis not present

## 2020-11-02 DIAGNOSIS — M1120 Other chondrocalcinosis, unspecified site: Secondary | ICD-10-CM | POA: Diagnosis not present

## 2020-11-02 DIAGNOSIS — M2550 Pain in unspecified joint: Secondary | ICD-10-CM | POA: Diagnosis not present

## 2020-11-02 DIAGNOSIS — M150 Primary generalized (osteo)arthritis: Secondary | ICD-10-CM | POA: Diagnosis not present

## 2020-11-06 DIAGNOSIS — D135 Benign neoplasm of extrahepatic bile ducts: Secondary | ICD-10-CM | POA: Diagnosis not present

## 2020-11-06 DIAGNOSIS — G4733 Obstructive sleep apnea (adult) (pediatric): Secondary | ICD-10-CM | POA: Diagnosis not present

## 2020-11-06 DIAGNOSIS — I255 Ischemic cardiomyopathy: Secondary | ICD-10-CM | POA: Diagnosis not present

## 2020-11-06 DIAGNOSIS — I272 Pulmonary hypertension, unspecified: Secondary | ICD-10-CM | POA: Diagnosis not present

## 2020-11-06 DIAGNOSIS — Z794 Long term (current) use of insulin: Secondary | ICD-10-CM | POA: Diagnosis not present

## 2020-11-06 DIAGNOSIS — Z87891 Personal history of nicotine dependence: Secondary | ICD-10-CM | POA: Diagnosis not present

## 2020-11-06 DIAGNOSIS — I340 Nonrheumatic mitral (valve) insufficiency: Secondary | ICD-10-CM | POA: Diagnosis not present

## 2020-11-06 DIAGNOSIS — E119 Type 2 diabetes mellitus without complications: Secondary | ICD-10-CM | POA: Diagnosis not present

## 2020-11-06 DIAGNOSIS — Z9989 Dependence on other enabling machines and devices: Secondary | ICD-10-CM | POA: Diagnosis not present

## 2020-11-06 DIAGNOSIS — Z86010 Personal history of colonic polyps: Secondary | ICD-10-CM | POA: Diagnosis not present

## 2020-11-10 ENCOUNTER — Encounter: Payer: Self-pay | Admitting: Family Medicine

## 2020-11-11 ENCOUNTER — Other Ambulatory Visit: Payer: Self-pay | Admitting: Cardiology

## 2020-11-18 ENCOUNTER — Other Ambulatory Visit: Payer: Self-pay | Admitting: Cardiology

## 2020-11-25 ENCOUNTER — Other Ambulatory Visit: Payer: Self-pay

## 2020-11-25 ENCOUNTER — Encounter: Payer: Self-pay | Admitting: Family Medicine

## 2020-11-25 ENCOUNTER — Ambulatory Visit (INDEPENDENT_AMBULATORY_CARE_PROVIDER_SITE_OTHER): Payer: Medicare Other | Admitting: Family Medicine

## 2020-11-25 VITALS — BP 118/60 | HR 97 | Temp 97.9°F | Ht 66.5 in | Wt 164.8 lb

## 2020-11-25 DIAGNOSIS — E1165 Type 2 diabetes mellitus with hyperglycemia: Secondary | ICD-10-CM | POA: Diagnosis not present

## 2020-11-25 LAB — COMPREHENSIVE METABOLIC PANEL
ALT: 13 U/L (ref 0–53)
AST: 11 U/L (ref 0–37)
Albumin: 4.1 g/dL (ref 3.5–5.2)
Alkaline Phosphatase: 86 U/L (ref 39–117)
BUN: 23 mg/dL (ref 6–23)
CO2: 27 mEq/L (ref 19–32)
Calcium: 9.8 mg/dL (ref 8.4–10.5)
Chloride: 98 mEq/L (ref 96–112)
Creatinine, Ser: 1.12 mg/dL (ref 0.40–1.50)
GFR: 64.22 mL/min (ref >60.00)
Glucose, Bld: 231 mg/dL — ABNORMAL HIGH (ref 70–99)
Potassium: 4.7 mEq/L (ref 3.5–5.1)
Sodium: 135 mEq/L (ref 135–145)
Total Bilirubin: 0.4 mg/dL (ref 0.2–1.2)
Total Protein: 6.6 g/dL (ref 6.0–8.3)

## 2020-11-25 LAB — POCT GLYCOSYLATED HEMOGLOBIN (HGB A1C): Hemoglobin A1C: 9 % — AB (ref 4.0–5.6)

## 2020-11-25 NOTE — Patient Instructions (Signed)
-  recheck calcium in labs today.  -continue trulicity and metformin. You are going to have to start insulin when you get back to get your sugars under control. When sugars are high it's hard to get under control without the insulin. When you get back, let me know so we can start the insulin.    -the american diabetic association website has an amazing food education part.   -let me know when you get back and enjoy your time in [place]!    Safe travels,  Dr. Rogers Blocker

## 2020-11-25 NOTE — Progress Notes (Signed)
Patient: HILLIS MCPHATTER MRN: [id number] DOB: [birth year] PCP: Orma Flaming, MD     Subjective:  Chief Complaint  Patient presents with  . Diabetes    HPI: The patient is a 75 y.o. male who presents today for DM/Hyperlipidemia. Pt's wife says that he will have a another ERCP done on Monday.    Diabetes: Patient is here for follow up of type 2 diabetes.   Currently on the following medications metformin 1000mg  BID and  started trulicity .75mg /weekly on 10/14/20.  Takes medications as prescribed. Last A1C was 9.0. Currently exercising and NOT following a diabetic diet.  Denies any hypoglycemic events. Denies any vision changes, nausea, vomiting, abdominal pain, ulcers, polyuria, polydipsia or polyphagia. Denies any chest pain, shortness of breath. 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 He doesn't remember if he tolerated gabapentin.  -he was put on jardiance but it didn't do much for his blood sugars -he was on amaryl, but had some low events.  -he does have some mild nausea 1-2 days with injection of trulicity, but is tolerating. Doesn't think he can tolerate a higher dose.  -makes homemade bread and eats 2 pieces of cinnamon bread every am  Elevated serum calcium X 2 elevated readings with trend upward. Takes no exogenous calcium.   Review of Systems  Constitutional: Negative for chills, fatigue and fever.  HENT: Negative for dental problem, ear pain, hearing loss and trouble swallowing.   Eyes: Negative for visual disturbance.  Respiratory: Negative for cough, chest tightness and shortness of breath.   Cardiovascular: Negative for chest pain, palpitations and leg swelling.  Gastrointestinal: Negative for abdominal pain, blood in stool, diarrhea and nausea.  Endocrine: Negative for cold intolerance, polydipsia, polyphagia and polyuria.  Genitourinary: Negative for dysuria and hematuria.  Musculoskeletal: Positive for arthralgias.  Skin: Negative for rash.  Neurological: Negative for  dizziness and headaches.  Psychiatric/Behavioral: Negative for dysphoric mood and sleep disturbance. The patient is not nervous/anxious.     Allergies Patient is allergic to statins and tetanus toxoids.  Past Medical History Patient  has a past medical history of Carotid bruit, Diabetes mellitus without complication ([place]), Eczema, Heart failure ([place]), History of colonic polyps, Hypercholesterolemia, Hyperlipidemia, Macular degeneration, Osteoarthritis, Paresthesias, Pseudogout, and Subclavian steal syndrome.  Surgical History Patient  has a past surgical history that includes Tonsillectomy; Colonoscopy with propofol (N/A, 10/24/2016); Knee surgery; Hydrocele excision / repair; Refractive surgery; and Cardiac catheterization.  Family History Pateint's family history includes Bipolar disorder in his brother; Cancer in his paternal grandmother; Diabetes in his brother; Heart failure in his father.  Social History Patient  reports that he quit smoking about 4 years ago. His smoking use included cigarettes. He has a 59.00 pack-year smoking history. He has never used smokeless tobacco. He reports current alcohol use. He reports that he does not use drugs.    Objective: Vitals:   11/25/20 1024  BP: 118/60  Pulse: 97  Temp: 97.9 F (36.6 C)  TempSrc: Temporal  SpO2: 98%  Weight: 164 lb 12.8 oz (74.8 kg)  Height: 5' 6.5" (1.689 m)    Body mass index is 26.2 kg/m.  Physical Exam Vitals reviewed.  Constitutional:      Appearance: Normal appearance. He is well-developed. He is obese.  HENT:     Head: Normocephalic and atraumatic.     Right Ear: External ear normal.     Left Ear: External ear normal.  Eyes:     Conjunctiva/sclera: Conjunctivae normal.     Pupils: Pupils  are equal, round, and reactive to light.  Neck:     Thyroid: No thyromegaly.  Cardiovascular:     Rate and Rhythm: Normal rate and regular rhythm.     Heart sounds: Normal heart sounds. No murmur  heard.   Pulmonary:     Effort: Pulmonary effort is normal.     Breath sounds: Normal breath sounds.  Abdominal:     General: Bowel sounds are normal. There is no distension.     Palpations: Abdomen is soft.     Tenderness: There is no abdominal tenderness.  Musculoskeletal:     Cervical back: Normal range of motion and neck supple.  Lymphadenopathy:     Cervical: No cervical adenopathy.  Skin:    General: Skin is warm and dry.     Capillary Refill: Capillary refill takes less than 2 seconds.     Findings: No rash.  Neurological:     General: No focal deficit present.     Mental Status: He is alert and oriented to person, place, and time.     Cranial Nerves: No cranial nerve deficit.     Coordination: Coordination normal.     Deep Tendon Reflexes: Reflexes normal.  Psychiatric:        Mood and Affect: Mood normal.        Behavior: Behavior normal.    A1C: 9.0 today    Assessment/plan: 1. Uncontrolled type 2 diabetes mellitus with hyperglycemia ([place]) Diabetes worse despite starting GLP-1. Had long talk about diet. He likes to bake and likely eats too many carbs.  -still having nausea so do not want to increase his trulicity. Also discussed skipping a week as I don't want him nauseated for days on trulicity. May need to see about rybelsus. He just spent 924 dollars on trulicity so would like to use it.   -discussed I think we need to start insulin at this point as I do not feel like we are going to get him controlled on oral medication alone and non insulin products. Had no effect with SGLT2 drugs. Hypo events with amaryl. Discussed pathophysiology in detail with them. They are leaving for [place] next week and are very hesitant about starting insulin right now as if something happens they can not access health care.  -will stay the course for the next 6 weeks and when they get back they will let me know so we can start lantus. He will work on diet while away.  - POCT glycosylated  hemoglobin (Hb A1C)  2. Serum calcium elevated  - Comprehensive metabolic panel - PTH, intact (no Ca)    This visit occurred during the SARS-CoV-2 public health emergency.  Safety protocols were in place, including screening questions prior to the visit, additional usage of staff PPE, and extensive cleaning of exam room while observing appropriate contact time as indicated for disinfecting solutions.     Return if symptoms worsen or fail to improve.   Orma Flaming, MD [place]   11/25/2020

## 2020-11-26 DIAGNOSIS — H40022 Open angle with borderline findings, high risk, left eye: Secondary | ICD-10-CM | POA: Diagnosis not present

## 2020-11-26 DIAGNOSIS — H353131 Nonexudative age-related macular degeneration, bilateral, early dry stage: Secondary | ICD-10-CM | POA: Diagnosis not present

## 2020-11-26 DIAGNOSIS — H401111 Primary open-angle glaucoma, right eye, mild stage: Secondary | ICD-10-CM | POA: Diagnosis not present

## 2020-11-26 LAB — PARATHYROID HORMONE, INTACT (NO CA): PTH: 48 pg/mL (ref 16–77)

## 2020-11-30 ENCOUNTER — Ambulatory Visit: Payer: Medicare Other | Admitting: Family Medicine

## 2020-11-30 DIAGNOSIS — I255 Ischemic cardiomyopathy: Secondary | ICD-10-CM | POA: Diagnosis not present

## 2020-11-30 DIAGNOSIS — I272 Pulmonary hypertension, unspecified: Secondary | ICD-10-CM | POA: Diagnosis not present

## 2020-11-30 DIAGNOSIS — Z794 Long term (current) use of insulin: Secondary | ICD-10-CM | POA: Diagnosis not present

## 2020-11-30 DIAGNOSIS — E119 Type 2 diabetes mellitus without complications: Secondary | ICD-10-CM | POA: Diagnosis not present

## 2020-11-30 DIAGNOSIS — Z4689 Encounter for fitting and adjustment of other specified devices: Secondary | ICD-10-CM | POA: Diagnosis not present

## 2020-11-30 DIAGNOSIS — D135 Benign neoplasm of extrahepatic bile ducts: Secondary | ICD-10-CM | POA: Diagnosis not present

## 2020-11-30 DIAGNOSIS — I739 Peripheral vascular disease, unspecified: Secondary | ICD-10-CM | POA: Diagnosis not present

## 2020-11-30 DIAGNOSIS — Z87891 Personal history of nicotine dependence: Secondary | ICD-10-CM | POA: Diagnosis not present

## 2020-11-30 DIAGNOSIS — Z8719 Personal history of other diseases of the digestive system: Secondary | ICD-10-CM | POA: Diagnosis not present

## 2020-11-30 DIAGNOSIS — Z9989 Dependence on other enabling machines and devices: Secondary | ICD-10-CM | POA: Diagnosis not present

## 2020-11-30 DIAGNOSIS — G4733 Obstructive sleep apnea (adult) (pediatric): Secondary | ICD-10-CM | POA: Diagnosis not present

## 2020-11-30 DIAGNOSIS — I340 Nonrheumatic mitral (valve) insufficiency: Secondary | ICD-10-CM | POA: Diagnosis not present

## 2021-01-05 ENCOUNTER — Telehealth: Payer: Self-pay | Admitting: Cardiology

## 2021-01-05 MED ORDER — ENTRESTO 97-103 MG PO TABS: 1.0000 | ORAL_TABLET | Freq: Two times a day (BID) | ORAL | 1 refills | Status: DC

## 2021-01-05 NOTE — Telephone Encounter (Signed)
Rx(s) sent to pharmacy electronically.  

## 2021-01-05 NOTE — Telephone Encounter (Signed)
*  STAT* If patient is at the pharmacy, call can be transferred to refill team.   1. Which medications need to be refilled? (please list name of each medication and dose if known) ENTRESTO 97-103 MG  2. Which pharmacy/location (including street and city if local pharmacy) is medication to be sent to? [facility]  [address], [place] [address]  [phone]  9. Do they need a 30 day or 90 day supply?90 day supply    [name] is calling for her husband.She states the prescription has expired and the pharmacy is requesting a new script.She also states that they are located in [place] right now but they will be home in [place].PT is all out of the medication and he cannot pick it up until May 13,2022

## 2021-01-07 ENCOUNTER — Encounter: Payer: Self-pay | Admitting: Family Medicine

## 2021-01-26 ENCOUNTER — Other Ambulatory Visit: Payer: Self-pay | Admitting: Cardiology

## 2021-02-03 ENCOUNTER — Telehealth: Payer: Self-pay

## 2021-02-03 DIAGNOSIS — E089 Diabetes mellitus due to underlying condition without complications: Secondary | ICD-10-CM | POA: Diagnosis not present

## 2021-02-03 DIAGNOSIS — M150 Primary generalized (osteo)arthritis: Secondary | ICD-10-CM | POA: Diagnosis not present

## 2021-02-03 DIAGNOSIS — M0609 Rheumatoid arthritis without rheumatoid factor, multiple sites: Secondary | ICD-10-CM | POA: Diagnosis not present

## 2021-02-03 DIAGNOSIS — M1120 Other chondrocalcinosis, unspecified site: Secondary | ICD-10-CM | POA: Diagnosis not present

## 2021-02-03 DIAGNOSIS — M2550 Pain in unspecified joint: Secondary | ICD-10-CM | POA: Diagnosis not present

## 2021-02-03 NOTE — Telephone Encounter (Signed)
Patient is asking to Transfer to dr.hunter from [place] [facility] he was referred by [facility].  Please advise.      [phone] Olin Hauser.

## 2021-02-04 NOTE — Telephone Encounter (Signed)
Is this okay?

## 2021-02-04 NOTE — Telephone Encounter (Signed)
I will accept patient-visit may be multiple months out for first visit-please make sure this is 40 minutes so we can review full history.  You can change me to his PCP already though so requests/concerns will come to my in basket 

## 2021-02-05 NOTE — Telephone Encounter (Signed)
Please schedule app with the pt. Please make slot or 40 min.  Thanks!

## 2021-02-05 NOTE — Telephone Encounter (Signed)
LVM TO GET PATIENT SCHEDULED

## 2021-02-11 DIAGNOSIS — L281 Prurigo nodularis: Secondary | ICD-10-CM | POA: Diagnosis not present

## 2021-02-11 DIAGNOSIS — L821 Other seborrheic keratosis: Secondary | ICD-10-CM | POA: Diagnosis not present

## 2021-02-11 DIAGNOSIS — L570 Actinic keratosis: Secondary | ICD-10-CM | POA: Diagnosis not present

## 2021-02-11 DIAGNOSIS — L578 Other skin changes due to chronic exposure to nonionizing radiation: Secondary | ICD-10-CM | POA: Diagnosis not present

## 2021-02-26 ENCOUNTER — Ambulatory Visit (HOSPITAL_COMMUNITY)
Admission: EM | Admit: 2021-02-26 | Discharge: 2021-02-26 | Disposition: A | Discharge status: Home / Self Care | Payer: Medicare Other | Attending: Urgent Care | Admitting: Urgent Care

## 2021-02-26 ENCOUNTER — Ambulatory Visit (INDEPENDENT_AMBULATORY_CARE_PROVIDER_SITE_OTHER): Payer: Medicare Other

## 2021-02-26 ENCOUNTER — Encounter (HOSPITAL_COMMUNITY): Payer: Self-pay | Admitting: Emergency Medicine

## 2021-02-26 DIAGNOSIS — R9389 Abnormal findings on diagnostic imaging of other specified body structures: Secondary | ICD-10-CM

## 2021-02-26 DIAGNOSIS — R0782 Intercostal pain: Secondary | ICD-10-CM

## 2021-02-26 DIAGNOSIS — W19XXXA Unspecified fall, initial encounter: Secondary | ICD-10-CM | POA: Diagnosis not present

## 2021-02-26 DIAGNOSIS — S40812A Abrasion of left upper arm, initial encounter: Secondary | ICD-10-CM | POA: Diagnosis not present

## 2021-02-26 DIAGNOSIS — I700 Atherosclerosis of aorta: Secondary | ICD-10-CM | POA: Diagnosis not present

## 2021-02-26 DIAGNOSIS — R918 Other nonspecific abnormal finding of lung field: Secondary | ICD-10-CM | POA: Diagnosis not present

## 2021-02-26 DIAGNOSIS — R0781 Pleurodynia: Secondary | ICD-10-CM

## 2021-02-26 DIAGNOSIS — S20212A Contusion of left front wall of thorax, initial encounter: Secondary | ICD-10-CM | POA: Diagnosis not present

## 2021-02-26 MED ORDER — METHOCARBAMOL 500 MG PO TABS: 500.0000 mg | ORAL_TABLET | Freq: Two times a day (BID) | ORAL | 0 refills | Status: DC

## 2021-02-26 MED ORDER — TRAMADOL HCL 50 MG PO TABS: 50.0000 mg | ORAL_TABLET | Freq: Four times a day (QID) | ORAL | 0 refills | Status: DC | PRN

## 2021-02-26 NOTE — ED Triage Notes (Signed)
Pt is present today with left side pain (chest, elbow, and knee) from a fall that occurred last week.

## 2021-02-26 NOTE — Discharge Instructions (Addendum)
Please schedule Tylenol at 500 mg - 650 mg once every 6 hours as needed for aches and pains.  If you still have pain despite taking Tylenol regularly, this is breakthrough pain.  You can use tramadol once every 6 hours for this.  Once your pain is better controlled, switch back to just Tylenol. You can also use methocarbamol (Robaxin) for muscle relaxant properties to get help managing the rib contusion.  Your chest x-ray did show an abnormal spot on the right side of the lung and the recommendation by the radiologist was to repeat this x-ray in the next 4 to 6 weeks.  Make sure you follow-up with your regular doctor to see if they want to pursue more studies such as a CT scan of your chest.

## 2021-02-26 NOTE — ED Provider Notes (Signed)
[place]   MRN: [id number] DOB: 12/11/1945  Subjective:   Casey Lynch is a 75 y.o. male presenting for 1 week history of persistent and worsening left-sided chest pain, rib pain.  Patient suffered an accidental fall off of a curb and landed on his left arm which made impact into the left side of his chest.  He did have pain along the left knee, left elbow and shoulder but this has improved significantly.  Has used Tylenol with out any relief of his left-sided chest wall pain.  He does have a history of pulmonary emphysema and last screening chest CT scan was benign in August 2021.  No current facility-administered medications for this encounter.  Current Outpatient Medications:    aspirin 81 MG chewable tablet, Chew by mouth., Disp: , Rfl:    B Complex Vitamins (VITAMIN B COMPLEX) TABS, Take by mouth., Disp: , Rfl:    Cholecalciferol (VITAMIN D3 PO), Take 1 capsule by mouth daily., Disp: , Rfl:    colchicine 0.6 MG tablet, Take 1 tablet (0.6 mg total) by mouth daily., Disp: 90 tablet, Rfl: 0   CONTOUR NEXT TEST test strip, USE TO CHECK BLOOD SUGAR TWICE DAILY AS DIRECTED, Disp: , Rfl:    Dulaglutide (TRULICITY) 6.06 TK/1.6WF SOPN, Inject 0.75 mg into the skin once a week., Disp: 6 mL, Rfl: 1   ENTRESTO 97-103 MG, TAKE 1 TABLET BY MOUTH TWICE DAILY, Disp: 180 tablet, Rfl: 3   furosemide (LASIX) 20 MG tablet, TAKE 1 TABLET BY MOUTH DAILY OR AS DIRECTED GENERIC EQUIVALENT FOR LASIX, Disp: 90 tablet, Rfl: 3   hydroxychloroquine (PLAQUENIL) 200 MG tablet, See admin instructions., Disp: , Rfl:    metFORMIN (GLUCOPHAGE-XR) 500 MG 24 hr tablet, Take 1,000 mg by mouth 2 (two) times daily., Disp: , Rfl:    metoprolol succinate (TOPROL-XL) 25 MG 24 hr tablet, Take 1 tablet (25 mg total) by mouth daily., Disp: 90 tablet, Rfl: 3   Multiple Vitamins-Minerals (PRESERVISION AREDS PO), , Disp: , Rfl:    Multiple Vitamins-Minerals (SYSTANE ICAPS AREDS2 PO), 1 capsule, Disp: , Rfl:     nitroGLYCERIN (NITROSTAT) 0.4 MG SL tablet, Place 1 tablet (0.4 mg total) under the tongue every 5 (five) minutes as needed for chest pain., Disp: 90 tablet, Rfl: 3   REPATHA SURECLICK 093 MG/ML SOAJ, ADMINISTER 1 ML(140MG ) UNDER THE SKIN EVERY 14 DAYS, Disp: 2 mL, Rfl: 11   triamcinolone (KENALOG) 0.1 %, 1 application to affected area, Disp: , Rfl:    Turmeric Curcumin 500 MG CAPS, Take by mouth., Disp: , Rfl:    Allergies  Allergen Reactions   Statins Other (See Comments)    Terrible joint pain   Tetanus Toxoids Other (See Comments)    Fever 104/105 & arm stiffiness    Past Medical History:  Diagnosis Date   Carotid bruit    Diabetes mellitus without complication (HCC)    Eczema    Heart failure (HCC)    History of colonic polyps    Hypercholesterolemia    Hyperlipidemia    Macular degeneration    Osteoarthritis    Paresthesias    Pseudogout    Subclavian steal syndrome      Past Surgical History:  Procedure Laterality Date   CARDIAC CATHETERIZATION     COLONOSCOPY WITH PROPOFOL N/A 10/24/2016   Procedure: COLONOSCOPY WITH PROPOFOL;  Surgeon: Garlan Fair, MD;  Location: [facility];  Service: Endoscopy;  Laterality: N/A;   HYDROCELE EXCISION /  REPAIR     KNEE SURGERY     REFRACTIVE SURGERY     TONSILLECTOMY      Family History  Problem Relation Age of Onset   Heart failure Father    Bipolar disorder Brother    Diabetes Brother    Cancer Paternal Grandmother     Social History   Tobacco Use   Smoking status: Former    Packs/day: 1.00    Years: 59.00    Pack years: 59.00    Types: Cigarettes    Quit date: 09/05/2016    Years since quitting: 4.4   Smokeless tobacco: Never  Vaping Use   Vaping Use: Never used  Substance Use Topics   Alcohol use: Yes    Alcohol/week: 0.0 standard drinks    Comment: Rare   Drug use: No    ROS   Objective:   Vitals: BP 127/84   Pulse 76   Temp 98.1 F (36.7 C) (Oral)   Resp 18   SpO2 99%   Physical  Exam Constitutional:      General: He is not in acute distress.    Appearance: Normal appearance. He is well-developed. He is not ill-appearing, toxic-appearing or diaphoretic.  HENT:     Head: Normocephalic and atraumatic.     Right Ear: External ear normal.     Left Ear: External ear normal.     Nose: Nose normal.     Mouth/Throat:     Mouth: Mucous membranes are moist.     Pharynx: Oropharynx is clear.  Eyes:     General: No scleral icterus.    Extraocular Movements: Extraocular movements intact.     Pupils: Pupils are equal, round, and reactive to light.  Cardiovascular:     Rate and Rhythm: Normal rate and regular rhythm.     Heart sounds: Normal heart sounds. No murmur heard.   No friction rub. No gallop.  Pulmonary:     Effort: Pulmonary effort is normal. No respiratory distress.     Breath sounds: Normal breath sounds. No stridor. No wheezing, rhonchi or rales.  Chest:     Chest wall: Tenderness (over area outlined) present. No lacerations, deformity, swelling, crepitus or edema.    Musculoskeletal:     Comments: Left extremity with full range of motion, strength 5/5.  There is an abrasion over lateral epicondyle area.  No erythema, swelling, drainage of pus or bleeding.  No tenderness.  Neurological:     Mental Status: He is alert and oriented to person, place, and time.  Psychiatric:        Mood and Affect: Mood normal.        Behavior: Behavior normal.        Thought Content: Thought content normal.    DG Ribs Unilateral W/Chest Left  Result Date: 02/26/2021 CLINICAL DATA:  Fall last week landing on left side.  A rib pain. EXAM: LEFT RIBS AND CHEST - 3+ VIEW COMPARISON:  Chest radiographs 09/14/2012.  CT 04/16/2020. FINDINGS: No marker was placed over the area of pain. The heart size and mediastinal contours are stable. There is new peripheral opacity in the right upper lobe which could reflect pneumonia, contusion or ill-defined mass. The left lung appears clear.  There is no pleural effusion or pneumothorax. No evidence of left-sided rib fracture or focal rib lesion. Abdominal aortic atherosclerosis noted. IMPRESSION: 1. No evidence of acute left-sided rib fracture, pleural effusion or pneumothorax. 2. New indeterminate opacity peripherally in the right upper lobe,  potentially contusion given the patient's recent injury. Pneumonia and an ill-defined mass cannot be excluded. Recommend chest radiographic follow-up in 4-6 weeks to document clearing. If this fails to resolve, further evaluation with CT recommended. 3. These results will be called to the ordering clinician or representative by the Radiologist Assistant, and communication documented in the PACS or [employer]. Electronically Signed   By: Richardean Sale M.D.   On: 02/26/2021 14:36    Assessment and Plan :   PDMP not reviewed this encounter.  1. Rib pain on left side   2. Fall, initial encounter   3. Abrasion of left arm, initial encounter   4. Abnormal chest x-ray   5. Rib contusion, left, initial encounter     Recommended conservative management with Tylenol, Robaxin and tramadol for breakthrough severe pain, rib contusion.  Reviewed his abnormal chest x-ray and incidental finding of the right upper lobe.  Recommended follow-up with his PCP to consider repeat x-ray, repeat chest CT scan.  Patient will come here if he is not able to get in with his PCP in 4 to 6 weeks.  Counseled patient on potential for adverse effects with medications prescribed/recommended today, ER and return-to-clinic precautions discussed, patient verbalized understanding.    Jaynee Eagles, PA-C 02/26/21 1513

## 2021-04-19 ENCOUNTER — Ambulatory Visit (INDEPENDENT_AMBULATORY_CARE_PROVIDER_SITE_OTHER)
Admission: RE | Admit: 2021-04-19 | Discharge: 2021-04-19 | Disposition: A | Discharge status: Home / Self Care | Payer: Medicare Other | Source: Ambulatory Visit | Attending: Family Medicine | Admitting: Family Medicine

## 2021-04-19 ENCOUNTER — Other Ambulatory Visit: Payer: Self-pay

## 2021-04-19 DIAGNOSIS — Z87891 Personal history of nicotine dependence: Secondary | ICD-10-CM

## 2021-04-22 ENCOUNTER — Telehealth: Payer: Self-pay | Admitting: Pulmonary Disease

## 2021-04-22 DIAGNOSIS — R918 Other nonspecific abnormal finding of lung field: Secondary | ICD-10-CM

## 2021-04-22 MED ORDER — AZITHROMYCIN 250 MG PO TABS: ORAL_TABLET | ORAL | 0 refills | Status: DC

## 2021-04-22 NOTE — Telephone Encounter (Signed)
I called and discussed results with patient He does not have any symptoms of bronchitis or pneumonia Screening CT from 8/15 shows inflammatory appearing lesion in the right upper lobe He had a chest x-ray in June 2022 after a fall which shows with opacity in the same area, which does raise the suspicion for a malignancy  I will order a Z-Pak empirically and a follow-up super D CT in 6 weeks.  If this lesion is persistent then consider bronchoscopy.  Marshell Garfinkel MD [facility] 04/22/2021, 9:17 AM

## 2021-04-22 NOTE — Telephone Encounter (Signed)
Super D order placed, Z pak prescription sent to pharmacy.  Nothing further at this time.

## 2021-04-27 NOTE — Progress Notes (Signed)
I have called the patient with the results of his low dose CT Chest. I explained that his scan was read as  Lung RADS 4 A : suspicious findings, either short term follow up in 3 months or alternatively  PET Scan evaluation may be considered when there is a solid component of  8 mm or larger.  nodules that are probably benign findings, short term follow up suggested: includes nodules with a low likelihood of becoming a clinically active cancer. Radiology recommends a 3 month repeat LDCT follow up.   I have told the patient that there was a notation that this could be an infectious or inflammatory change. We will re-evaluate in 3 months. Patient is in agreement with this plan. I explained that he will get a call closer to the time to set up his repeat CT in November 2022. He has verbalized understanding of the above and had no further questions at completion of the call.   Langley Gauss, please fax results to PCP, and order 3 month follow up. Please  let them know the plan for follow up in 3 months. Thanks so much

## 2021-04-29 ENCOUNTER — Other Ambulatory Visit: Payer: Self-pay | Admitting: *Deleted

## 2021-04-29 DIAGNOSIS — Z87891 Personal history of nicotine dependence: Secondary | ICD-10-CM

## 2021-05-17 ENCOUNTER — Ambulatory Visit: Payer: Medicare Other | Admitting: Podiatry

## 2021-05-17 ENCOUNTER — Encounter: Payer: Self-pay | Admitting: Podiatry

## 2021-05-17 ENCOUNTER — Other Ambulatory Visit: Payer: Self-pay

## 2021-05-17 DIAGNOSIS — E1149 Type 2 diabetes mellitus with other diabetic neurological complication: Secondary | ICD-10-CM

## 2021-05-17 DIAGNOSIS — M778 Other enthesopathies, not elsewhere classified: Secondary | ICD-10-CM

## 2021-05-17 DIAGNOSIS — E1140 Type 2 diabetes mellitus with diabetic neuropathy, unspecified: Secondary | ICD-10-CM | POA: Diagnosis not present

## 2021-05-17 MED ORDER — TRIAMCINOLONE ACETONIDE 10 MG/ML IJ SUSP
20.0000 mg | Freq: Once | INTRAMUSCULAR | Status: AC
  Administered 2021-05-17: 20 mg

## 2021-05-18 NOTE — Progress Notes (Signed)
Subjective:   Patient ID: Casey Lynch, male   DOB: 75 y.o.   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  Does not do a great job also of controlling his diabetes with his last A1c being 9     Plan:  H&P reviewed conditions and discussed the importance of controlling his sugar to try to control his neuropathy.  I do not see any medicines that would be of value and today I went ahead and I did inject periarticular around the second MPJ bilateral 3 mg Dexasone Kenalog 5 mg Xylocaine to try to reduce the inflammatory process pressed

## 2021-05-27 DIAGNOSIS — H401111 Primary open-angle glaucoma, right eye, mild stage: Secondary | ICD-10-CM | POA: Diagnosis not present

## 2021-05-27 DIAGNOSIS — H353131 Nonexudative age-related macular degeneration, bilateral, early dry stage: Secondary | ICD-10-CM | POA: Diagnosis not present

## 2021-05-27 DIAGNOSIS — E113293 Type 2 diabetes mellitus with mild nonproliferative diabetic retinopathy without macular edema, bilateral: Secondary | ICD-10-CM | POA: Diagnosis not present

## 2021-05-27 DIAGNOSIS — H40022 Open angle with borderline findings, high risk, left eye: Secondary | ICD-10-CM | POA: Diagnosis not present

## 2021-05-27 LAB — HM DIABETES EYE EXAM

## 2021-05-31 ENCOUNTER — Other Ambulatory Visit (HOSPITAL_BASED_OUTPATIENT_CLINIC_OR_DEPARTMENT_OTHER): Payer: Self-pay | Admitting: Cardiology

## 2021-06-04 ENCOUNTER — Ambulatory Visit (INDEPENDENT_AMBULATORY_CARE_PROVIDER_SITE_OTHER)
Admission: RE | Admit: 2021-06-04 | Discharge: 2021-06-04 | Disposition: A | Discharge status: Home / Self Care | Payer: Medicare Other | Source: Ambulatory Visit | Attending: Pulmonary Disease | Admitting: Pulmonary Disease

## 2021-06-04 ENCOUNTER — Other Ambulatory Visit: Payer: Self-pay

## 2021-06-04 DIAGNOSIS — R918 Other nonspecific abnormal finding of lung field: Secondary | ICD-10-CM

## 2021-06-04 DIAGNOSIS — I700 Atherosclerosis of aorta: Secondary | ICD-10-CM | POA: Diagnosis not present

## 2021-06-04 DIAGNOSIS — J439 Emphysema, unspecified: Secondary | ICD-10-CM | POA: Diagnosis not present

## 2021-06-04 DIAGNOSIS — R911 Solitary pulmonary nodule: Secondary | ICD-10-CM | POA: Diagnosis not present

## 2021-06-10 ENCOUNTER — Telehealth (HOSPITAL_BASED_OUTPATIENT_CLINIC_OR_DEPARTMENT_OTHER): Payer: Self-pay | Admitting: Cardiology

## 2021-06-10 ENCOUNTER — Telehealth: Payer: Self-pay | Admitting: Family Medicine

## 2021-06-10 ENCOUNTER — Other Ambulatory Visit: Payer: Self-pay | Admitting: *Deleted

## 2021-06-10 DIAGNOSIS — Z87891 Personal history of nicotine dependence: Secondary | ICD-10-CM

## 2021-06-10 NOTE — Progress Notes (Signed)
  Care Management   Follow Up Note   06/10/2021 Name: Casey Lynch MRN: [id number] DOB: [birth year]   Referred by: Marin Olp, MD Reason for referral : No chief complaint on file.   An unsuccessful telephone outreach was attempted today. The patient was referred to the case management team for assistance with care management and care coordination.   Follow Up Plan: No further follow up required:    [place]

## 2021-06-10 NOTE — Telephone Encounter (Signed)
Called patient- spoke to wife and patient, they state that for the past few weeks at least once a week at night time he experiences chest pain / discomfort. His blood pressure increases and his HR increases. Last night he had an episode and his BP was 187/110, and HR 114. They states the episodes only last about 10-15 minutes, does not change with movement or position change, does not change with deep breaths and does not occur with activity or at any other time of day. Denies SOB, or weight gain. Stated they had a chest CT for smoking, and then had another type done as well recently- they would like to have her review these as well. I advised I would route a message,but Dr.Christopher was out of office and response could be delayed. He is taking his medications- Metoprolol 25 mg daily- and Entresto have not changed. He was advised if the episodes continue or worsen they should go to the ED and not wait for visit. They verbalized understanding.  They have PCP visit on 10/17, and visit with Dr.Christopher on 10/25-

## 2021-06-10 NOTE — Telephone Encounter (Signed)
STAT if HR is under 50 or over 120 (normal HR is 60-100 beats per minute)  What is your heart rate? Last night was 110 not every night - but for the last 4 nights it have been high- not at this time  Do you have a log of your heart rate readings (document readings)? no  Do you have any other symptoms? Blood pressure was 187/110 last night- also chest pains every night when he have these episodes, he have chest pains - he does not have his readings for today

## 2021-06-14 ENCOUNTER — Telehealth: Payer: Self-pay | Admitting: Pulmonary Disease

## 2021-06-14 NOTE — Telephone Encounter (Signed)
Wife informed of MD's recommendations and state she will discuss more with pt regarding monitor, but almost certain he would prefer to talk with Dr. Harrell Gave first at upcoming appointment. Nurse advised wife to contact office if they change their mind and chose to order monitor prior to appointment. Wife also verbalized understanding regarding ED precautions.

## 2021-06-14 NOTE — Telephone Encounter (Signed)
The CT of the lungs doesn't tell us anything about blockages in the heart--just calcium, which we already knew about. Prior stress and echo were reassuring. It sounds like it might be a rhythm issue. We can either send him a live monitor, but it would probably need to be two weeks to catch an event, or he can get a kardia mobile and send me strips through mychart (or bring to his visit). Agree with recommendations for ER. It is hard to know if the chest pain is causing the elevated HR/BP or if those are causing the chest pain. If he has a severe event he should go to ER, otherwise we will review more at his upcoming visit. Thanks.

## 2021-06-14 NOTE — Progress Notes (Signed)
Phone [phone] In person visit   Subjective:   Casey Lynch is a 75 y.o. year old very pleasant male patient who presents to establish care with me-formerly patient of Dr. Rogers Blocker for/with See problem oriented charting Chief Complaint  Patient presents with   Diabetes   Hyperlipidemia   Gastroesophageal Reflux   This visit occurred during the SARS-CoV-2 public health emergency.  Safety protocols were in place, including screening questions prior to the visit, additional usage of staff PPE, and extensive cleaning of exam room while observing appropriate contact time as indicated for disinfecting solutions.   Past Medical History-  Patient Active Problem List   Diagnosis Date Noted   Diabetes type 2, uncontrolled 10/01/2020    Priority: High   Ischemic cardiomyopathy 07/25/2019    Priority: High   Chronic combined systolic and diastolic heart failure ([place]) 07/12/2018    Priority: High   Coronary artery disease due to lipid rich plaque 07/12/2018    Priority: High   Pseudogout involving multiple joints 10/01/2020    Priority: Medium    Mixed hyperlipidemia 07/12/2018    Priority: Medium    Pulmonary HTN ([place]) 06/14/2018    Priority: Medium    Personal history of COVID-19 10/25/2020    Priority: Low   Pulmonary emphysema ([place]) 06/14/2018    Priority: Low   Statin intolerance 07/12/2018    Medications- reviewed and updated Current Outpatient Medications  Medication Sig Dispense Refill   aspirin 81 MG chewable tablet Chew by mouth.     B Complex Vitamins (VITAMIN B COMPLEX) TABS Take by mouth.     Cholecalciferol (VITAMIN D3 PO) Take 1 capsule by mouth daily.     colchicine 0.6 MG tablet Take 1 tablet (0.6 mg total) by mouth daily. 90 tablet 0   empagliflozin (JARDIANCE) 10 MG TABS tablet Take 1 tablet (10 mg total) by mouth daily before breakfast. 90 tablet 2   ENTRESTO 97-103 MG TAKE 1 TABLET BY MOUTH TWICE DAILY 180 tablet 3   hydroxychloroquine (PLAQUENIL) 200 MG  tablet See admin instructions.     metFORMIN (GLUCOPHAGE-XR) 500 MG 24 hr tablet Take 1,000 mg by mouth 2 (two) times daily.     metoprolol succinate (TOPROL-XL) 25 MG 24 hr tablet Take 1 tablet (25 mg total) by mouth daily. 90 tablet 3   Multiple Vitamins-Minerals (PRESERVISION AREDS PO)      REPATHA SURECLICK 098 MG/ML SOAJ ADMINISTER 1 ML(140 MG) UNDER THE SKIN EVERY 14 DAYS 6 mL 3   triamcinolone (KENALOG) 0.1 % 1 application to affected area     Turmeric Curcumin 500 MG CAPS Take by mouth.     CONTOUR NEXT TEST test strip USE TO CHECK BLOOD SUGAR TWICE DAILY AS DIRECTED (Patient not taking: Reported on 06/21/2021)     furosemide (LASIX) 20 MG tablet TAKE 1 TABLET BY MOUTH DAILY OR AS DIRECTED GENERIC EQUIVALENT FOR LASIX (Patient not taking: Reported on 06/21/2021) 90 tablet 3   nitroGLYCERIN (NITROSTAT) 0.4 MG SL tablet Place 1 tablet (0.4 mg total) under the tongue every 5 (five) minutes as needed for chest pain. 90 tablet 3   No current facility-administered medications for this visit.     Objective:  BP 130/66   Pulse 78   Temp 98.6 F (37 C) (Temporal)   Ht 5' 6.5" (1.689 m)   Wt 170 lb 9.6 oz (77.4 kg)   SpO2 96%   BMI 27.12 kg/m  Gen: NAD, resting comfortably CV: RRR no murmurs rubs  or gallops Lungs: CTAB no crackles, wheeze, rhonchi Ext: no edema Skin: warm, dry  Diabetic Foot Exam - Simple   Simple Foot Form Diabetic Foot exam was performed with the following findings: Yes 06/21/2021  3:37 PM  Visual Inspection No deformities, no ulcerations, no other skin breakdown bilaterally: Yes Sensation Testing Intact to touch and monofilament testing bilaterally: Yes Pulse Check Posterior Tibialis and Dorsalis pulse intact bilaterally: Yes Comments       Assessment and Plan   #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 Injections with podiatry not helpful. Gets symptoms all day long. Didn't do well with gabapentin - had GERD.  - has been on basaglar prior to weight loss  -Trulicity 1.61 mg once a week but started experiencing nausea and GERD symptoms. - worsened despite that started GLP-1. Had long talk about diet. He liked to bake and likely ate too many carbs per last note -Apparently did not respond well to SGLT2's jardiance in regards to response.  Hypoglycemia with Amaryl.  Plan was to start insulin CBGs- not checking blood sugar Exercise and diet- walking 3-4 x a week 10k steps a day, lost 35 lbs uring pandemic- cooking better. 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 Up 6 lbs from last visit- stress from moving situation.  Lab Results  Component Value Date   HGBA1C 10.6 (A) 06/21/2021   HGBA1C 9.0 (A) 11/25/2020   HGBA1C 8.6 (H) 10/01/2020   A/P: Unfortunately very poor control of diabetes.  We discussed our point of care A1c readings are typically about 0.5 points lower than phlebotomy A1c.  -Discussed option of insulin-patient has been on this in the past -Patient would like to retrial Jardiance first-this would also be ideal given heart failure.  We also discussed aggressive dietary changes and 1 month follow-up. - Recommended checking blood sugar in the morning each day to get a new baseline  Options for diabetic neuropathy are not ideal given his comorbidities.  Has not tolerated gabapentin in the past and could increase swelling-though he has no significant issues with this despite heart failure history -try capsaicin topically-information given from up-to-date   #CAD-follows with Dr. Holland Falling RCA but diffuse mild disease #Chronic systolic and diastolic heart failure related to ischemic cardiomyopathy in addition to nonischemic cardiomyopathy. 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 BP up to 160s. Sats 92%, HR 114. Has had similar sensation with sex in past. This was during sleep at 11 pm and woke him. Dr. Shawna Orleans has said hold off on nitro per past conversations unless doesn't resolve in 15 mins or if in extremis- can take nitro and if not improving call EMS. Got better within 15 mins. One night got close to taking nitroglycerin. A lot of stress lately- 3 x sold a home this year and moved twice including selling home in [place].   Scheduled for visit with Dr. Harrell Gave next week.  A/P: CAD-appears to have stable angina.  I still want him to run this by Dr. Harrell Gave next week.  For now continue aspirin and Repatha.  Lipids have been well controlled with LDL under 70 on Repatha-ideal for hyperlipidemia with PAD and CAD.  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  Patient also request to do this lab as states this is commonly done by Dr. Coolidge Breeze do not see it recently ordered but I am certainly open to ordering in this case.  Not needing Lasix recently  PAD-avoid blood pressures in left arm.  Labs-CBC, CMP, lipid panel, BNP-we will see if Dr. Harrell Gave wants any additional labs   #mild COPD/former smoker S: Very mild overall.  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 Sees Dr. Delrae Alfred- [facility]- sent there by local provider - also ERCP yearly for 3 years - gets a growth at ampula of vater A/P: GERD appears well controlled-continue to monitor  #Psoriatic arthritis? S: on hydroxychloroquine 200mg   for possible psoriatic arthritis, on turmeric.  Follows with Dr. Trudie Reed A/P: Patient reports reasonable control-continue close follow-up with rheumatology  # psuedogout S:Colchicine through Dr. Trudie Reed  -started on b complex as well- apparently had lows A/P: Patient reports when he is try to help this in the past has had flares-continue current medication for now and follow-up with rheumatology  Recommended follow up: Return in about 1 month (around 07/22/2021) for follow up or sooner if needed. Future Appointments  Date Time Provider [place]  06/23/2021  8:15 AM [facility]  06/29/2021 10:00 AM Buford Dresser, MD [facility]  07/19/2021 10:00 AM Marin Olp, MD [facility]  08/16/2021  9:30 AM [facility] PHARMACIST [facility]    Lab/Order associations:   ICD-10-CM   1. Uncontrolled type 2 diabetes mellitus with hyperglycemia (HCC)  E11.65 POCT A1C    2. Gastroesophageal reflux disease with esophagitis, unspecified whether hemorrhage  K21.00     3. Mixed hyperlipidemia  E78.2 CBC with Differential/Platelet    Comprehensive metabolic panel    Lipid panel    4. Chronic combined systolic and diastolic heart failure (HCC)  I50.42 B Nat Peptide    5. Need for immunization against influenza  Z23 Flu Vaccine QUAD High Dose(Fluad)    6. 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    Dispense:  90 tablet    Refill:  2    Time Spent: 45  minutes of total time (3:05 PM-3:50 PM) was spent on the date of the encounter performing the following actions: chart review prior to seeing the patient, obtaining history, performing a medically necessary exam, counseling on the treatment plan, placing orders, and documenting in our EHR.    I,Jada Bradford,acting as a scribe for Garret Reddish, MD.,have documented all relevant documentation on the behalf of Garret Reddish, MD,as directed by  Garret Reddish, MD  while in the presence of Garret Reddish, MD.   I, Garret Reddish, MD, have reviewed all documentation for this visit. The documentation on 06/21/21 for the exam, diagnosis, procedures, and orders are all accurate and complete.   Return precautions advised.  Garret Reddish, MD

## 2021-06-14 NOTE — Telephone Encounter (Signed)
Received the following message from patient's wife's account:   "I cannot get Dr. Matilde Bash name to populate in the field for [facility]'s physicians. Hence, I am sending this message under my name. Dr. Vaughan Browner called Jenny Reichmann and I was on the call with Jenny Reichmann to share the results of his most recent CT scan. At the end of the report there was a comment about Dehaven's pancreatic duct and we told Dr. Vaughan Browner that Rainn is being seen by Dr. Harley Hallmark at [facility]. Is there a way that Dr. Vaughan Browner could send this test result to Dr. Delrae Alfred so that he would have it?  And while we're at it, could we get Dr. Matilde Bash name to populate as one of Masud's physicians so I can send messages on Taniela's behalf directly to Dr. Vaughan Browner without going through me? Lol!  Thank you for your attention to this. [name] and Maye Hides"  Replied to his wife in her account stating that in order to avoid confusion, I would document this in his chart as well as send him a message to see if that will give him the opportunity to message our office.   CT scan results have been sent to Dr. Vilinda Blanks office via [facility].

## 2021-06-15 NOTE — Telephone Encounter (Signed)
Contacted the [facility] on behalf of the patient. They will troubleshoot his account and see why Dr. Vaughan Browner is not on his list of providers for [facility]. Will check back in a few days to see if he has access to message Dr. Vaughan Browner.

## 2021-06-17 ENCOUNTER — Encounter: Payer: Self-pay | Admitting: Pharmacist

## 2021-06-17 DIAGNOSIS — T466X5A Adverse effect of antihyperlipidemic and antiarteriosclerotic drugs, initial encounter: Secondary | ICD-10-CM

## 2021-06-17 DIAGNOSIS — G720 Drug-induced myopathy: Secondary | ICD-10-CM

## 2021-06-17 NOTE — Progress Notes (Signed)
[facility])                                            Effort Team                                        Statin Quality Measure Assessment    06/17/2021  Casey Lynch 05/31/1946 [id number]   Per review of chart and payor information, patient has a diagnosis of diabetes but is not currently filling a statin prescription.  This places patient into the SUPD (Statin Use In Patients with Diabetes) measure for CMS.    Patient has documented trials of statins with reported myopathy, but no corresponding CPT codes that would exclude patient from SUPD measure. Unfortunately, the statin intolerance code that is listed on the patient's problem list is not an approved exclusion code.  He is on Repatha. To be removed from the measure, a statin exclusion code could be associated with a provider visit. Patient has an upcoming PCP appointment on 06/21/21.    The ASCVD Risk score (Arnett DK, et al., 2019) failed to calculate for the following reasons:   The valid total cholesterol range is 130 to 320 mg/dL 12/24/2019     Component Value Date/Time   CHOL 120 12/24/2019 1605   TRIG 277 (H) 12/24/2019 1605   HDL 48 12/24/2019 1605   CHOLHDL 2.5 12/24/2019 1605   LDLCALC 31 12/24/2019 1605    Please consider coding for statin intolerance to remove the patient from the measure at her upcoming visit, if deemed therapeutically appropriate.   Code for past statin intolerance or  other exclusions (required annually)   Provider Requirements:  Associate code during an office visit or telehealth encounter  Drug Induced Myopathy G72.0   Myopathy, unspecified G72.9   Myositis, unspecified M60.9   Rhabdomyolysis B84.66   Alcoholic fatty liver Z99.3   Cirrhosis of liver K74.69   Prediabetes R73.03   PCOS E28.2   Toxic liver disease, unspecified K71.9   Adverse effect of antihyperlipidemic and antiarteriosclerotic drugs, initial  encounter T70.1X7L    Plan: Route note to PCP prior to patient's next appointment.   Elayne Guerin, PharmD, [place] Clinical Pharmacist [phone]

## 2021-06-21 ENCOUNTER — Telehealth: Payer: Self-pay | Admitting: Family Medicine

## 2021-06-21 ENCOUNTER — Encounter: Payer: Self-pay | Admitting: Family Medicine

## 2021-06-21 ENCOUNTER — Ambulatory Visit (INDEPENDENT_AMBULATORY_CARE_PROVIDER_SITE_OTHER): Payer: Medicare Other | Admitting: Family Medicine

## 2021-06-21 ENCOUNTER — Other Ambulatory Visit: Payer: Self-pay

## 2021-06-21 VITALS — BP 130/66 | HR 78 | Temp 98.6°F | Ht 66.5 in | Wt 170.6 lb

## 2021-06-21 DIAGNOSIS — E782 Mixed hyperlipidemia: Secondary | ICD-10-CM | POA: Diagnosis not present

## 2021-06-21 DIAGNOSIS — K2100 Gastro-esophageal reflux disease with esophagitis, without bleeding: Secondary | ICD-10-CM

## 2021-06-21 DIAGNOSIS — G720 Drug-induced myopathy: Secondary | ICD-10-CM

## 2021-06-21 DIAGNOSIS — I5042 Chronic combined systolic (congestive) and diastolic (congestive) heart failure: Secondary | ICD-10-CM

## 2021-06-21 DIAGNOSIS — Z23 Encounter for immunization: Secondary | ICD-10-CM | POA: Diagnosis not present

## 2021-06-21 DIAGNOSIS — T380X5A Adverse effect of glucocorticoids and synthetic analogues, initial encounter: Secondary | ICD-10-CM

## 2021-06-21 DIAGNOSIS — E1165 Type 2 diabetes mellitus with hyperglycemia: Secondary | ICD-10-CM

## 2021-06-21 LAB — POCT GLYCOSYLATED HEMOGLOBIN (HGB A1C): Hemoglobin A1C: 10.6 % — AB (ref 4.0–5.6)

## 2021-06-21 MED ORDER — EMPAGLIFLOZIN 10 MG PO TABS
10.0000 mg | ORAL_TABLET | Freq: Every day | ORAL | 2 refills | Status: DC
Start: 2021-06-21 — End: 2021-08-16

## 2021-06-21 NOTE — Chronic Care Management (AMB) (Signed)
  Care Management  Note   06/21/2021 Name: [name] MRN: [id number] DOB: [birth year]  Casey Lynch is a 75 y.o. year old male who is a primary care patient of Yong Channel, Brayton Mars, MD. The care management team was consulted for assistance with chronic disease management and care coordination needs.   [name] was given information about Care Management services today including:  CCM service includes personalized support from designated clinical staff supervised by the physician, including individualized plan of care and coordination with other care providers 24/7 contact phone numbers for assistance for urgent and routine care needs. Service will only be billed when office clinical staff spend 20 minutes or more in a month to coordinate care. Only one practitioner may furnish and bill the service in a calendar month. The patient may stop CCM services at amy time (effective at the end of the month) by phone call to the office staff. The patient will be responsible for cost sharing (co-pay) or up to 20% of the service fee (after annual deductible is met)  Patient agreed to services and verbal consent obtained.  Follow up plan:   Face to Face appointment with care management team member scheduled for: 08/16/21 $[email or web address]'[email or web address]$   Noelle Penner Upstream Scheduler

## 2021-06-21 NOTE — Patient Instructions (Addendum)
Health Maintenance Due  Topic Date Due   OPHTHALMOLOGY EXAM   -If you have had your eye exam within the last year, please sign release of information at check out desk.   If you have not had an eye exam within a year, please get one at this time as this is important for your diabetes care. Never done   INFLUENZA VACCINE High dose done today in office.  04/05/2021   Monitor blood sugar daily and start Jardiance 10mg  and see me in one month.  Please get aggressive about your dietary changes.   Diabetes education-Get on youtube and access videos by Dr. Smiley Houseman (an endocrinologist. Listen to diabetes education parts 1-5)  Possible for neuropathy Capsaicin - Capsaicin cream (0.075% applied topically four times daily) can be used on its own or added to first-line medications for patients with a partial response or intolerance of higher doses. It is also available in gel, liquid, and lotion formulations for topical application. Local burning and skin irritation can occur, but this becomes less of a problem with continued use. Nevertheless, many patients are unable to tolerate the local burning pain, which is exacerbated by contact with warm water and hot weather [22].  The high-concentration capsaicin (8%) patch is FDA approved to treat painful diabetic neuropathy at the feet [41]. Patches are administered by a health care professional as a single [id number]-[id number] application, and patients are monitored for up to two hours following treatment. The treatment may be repeated at three months for patients with partial or temporary relief after the initial application. Progressive improvements may be achieved with subsequent applications [17]. However, patients may report localized erythema, pain, or post-application sensory loss with use. Patches must be handled carefully, and the application site is usually pretreated with a local anesthetic such as topical lidocaine.  Capsaicin is a naturally occurring  component of many hot peppers and causes analgesia through local depletion of substance P. In randomized trials in patients with painful diabetic neuropathy, capsaicin has been associated with modest but statistically significant improvement in pain compared with placebo [22,43-46].  Recommended follow up: Return in about 1 month (around 07/22/2021) for follow up or sooner if needed.

## 2021-06-23 ENCOUNTER — Other Ambulatory Visit: Payer: Self-pay

## 2021-06-23 ENCOUNTER — Other Ambulatory Visit (INDEPENDENT_AMBULATORY_CARE_PROVIDER_SITE_OTHER): Payer: Medicare Other

## 2021-06-23 DIAGNOSIS — I5042 Chronic combined systolic (congestive) and diastolic (congestive) heart failure: Secondary | ICD-10-CM

## 2021-06-23 DIAGNOSIS — E782 Mixed hyperlipidemia: Secondary | ICD-10-CM

## 2021-06-23 LAB — COMPREHENSIVE METABOLIC PANEL
ALT: 20 U/L (ref 0–53)
AST: 15 U/L (ref 0–37)
Albumin: 4.2 g/dL (ref 3.5–5.2)
Alkaline Phosphatase: 96 U/L (ref 39–117)
BUN: 21 mg/dL (ref 6–23)
CO2: 27 mEq/L (ref 19–32)
Calcium: 9.8 mg/dL (ref 8.4–10.5)
Chloride: 99 mEq/L (ref 96–112)
Creatinine, Ser: 1.17 mg/dL (ref 0.40–1.50)
GFR: 60.7 mL/min (ref >60.00)
Glucose, Bld: 258 mg/dL — ABNORMAL HIGH (ref 70–99)
Potassium: 4.7 mEq/L (ref 3.5–5.1)
Sodium: 134 mEq/L — ABNORMAL LOW (ref 135–145)
Total Bilirubin: 0.3 mg/dL (ref 0.2–1.2)
Total Protein: 7.1 g/dL (ref 6.0–8.3)

## 2021-06-23 LAB — CBC WITH DIFFERENTIAL/PLATELET
Basophils Absolute: 0.1 10*3/uL (ref 0.0–0.1)
Basophils Relative: 1.3 % (ref 0.0–3.0)
Eosinophils Absolute: 0.7 10*3/uL (ref 0.0–0.7)
Eosinophils Relative: 7.8 % — ABNORMAL HIGH (ref 0.0–5.0)
HCT: 36.5 % — ABNORMAL LOW (ref 39.0–52.0)
Hemoglobin: 12.1 g/dL — ABNORMAL LOW (ref 13.0–17.0)
Lymphocytes Relative: 24.4 % (ref 12.0–46.0)
Lymphs Abs: 2.2 10*3/uL (ref 0.7–4.0)
MCHC: 33.1 g/dL (ref 30.0–36.0)
MCV: 88.6 fl (ref 78.0–100.0)
Monocytes Absolute: 0.7 10*3/uL (ref 0.1–1.0)
Monocytes Relative: 7.4 % (ref 3.0–12.0)
Neutro Abs: 5.4 10*3/uL (ref 1.4–7.7)
Neutrophils Relative %: 59.1 % (ref 43.0–77.0)
Platelets: 345 10*3/uL (ref 150.0–400.0)
RBC: 4.12 Mil/uL — ABNORMAL LOW (ref 4.22–5.81)
RDW: 13.4 % (ref 11.5–15.5)
WBC: 9.2 10*3/uL (ref 4.0–10.5)

## 2021-06-23 LAB — LDL CHOLESTEROL, DIRECT: Direct LDL: 50 mg/dL

## 2021-06-23 LAB — BRAIN NATRIURETIC PEPTIDE: Pro B Natriuretic peptide (BNP): 41 pg/mL (ref 0.0–100.0)

## 2021-06-23 LAB — LIPID PANEL
Cholesterol: 128 mg/dL (ref 0–200)
HDL: 48.2 mg/dL (ref >39.00)
NonHDL: 80.14
Total CHOL/HDL Ratio: 3
Triglycerides: 262 mg/dL — ABNORMAL HIGH (ref 0.0–149.0)
VLDL: 52.4 mg/dL — ABNORMAL HIGH (ref 0.0–40.0)

## 2021-06-29 ENCOUNTER — Ambulatory Visit (HOSPITAL_BASED_OUTPATIENT_CLINIC_OR_DEPARTMENT_OTHER): Payer: Medicare Other | Admitting: Cardiology

## 2021-06-29 ENCOUNTER — Other Ambulatory Visit: Payer: Self-pay

## 2021-06-29 ENCOUNTER — Encounter (HOSPITAL_BASED_OUTPATIENT_CLINIC_OR_DEPARTMENT_OTHER): Payer: Self-pay | Admitting: Cardiology

## 2021-06-29 VITALS — BP 132/74 | HR 87 | Resp 98 | Ht 66.5 in | Wt 168.2 lb

## 2021-06-29 DIAGNOSIS — E1159 Type 2 diabetes mellitus with other circulatory complications: Secondary | ICD-10-CM

## 2021-06-29 DIAGNOSIS — I5042 Chronic combined systolic (congestive) and diastolic (congestive) heart failure: Secondary | ICD-10-CM

## 2021-06-29 DIAGNOSIS — Z789 Other specified health status: Secondary | ICD-10-CM

## 2021-06-29 DIAGNOSIS — I255 Ischemic cardiomyopathy: Secondary | ICD-10-CM

## 2021-06-29 DIAGNOSIS — R079 Chest pain, unspecified: Secondary | ICD-10-CM

## 2021-06-29 DIAGNOSIS — I2510 Atherosclerotic heart disease of native coronary artery without angina pectoris: Secondary | ICD-10-CM

## 2021-06-29 DIAGNOSIS — E782 Mixed hyperlipidemia: Secondary | ICD-10-CM

## 2021-06-29 DIAGNOSIS — I272 Pulmonary hypertension, unspecified: Secondary | ICD-10-CM

## 2021-06-29 NOTE — Progress Notes (Signed)
Cardiology Office Note:    Date:  06/30/2021   ID:  Casey Lynch, DOB [birth year], MRN [id number]  PCP:  Marin Olp, MD  Cardiologist:  Buford Dresser, MD  Referring MD: Orma Flaming, MD   CC: follow up  History of Present Illness:    Casey Lynch is a 75 y.o. male with a hx of with CAD, chronic systolic and diastolic heart failure, ischemic cardiomyopathy, hyperlipidemia, PAD (carotid bruit <50% bilaterally, left subclavian steal syndrome), COPD, former tobacco abuse, OSA on CPAP, type II diabetes. I met him 07/11/2018, and his initial cardiac evaluation from [place] is summarized in that note.  Today: He is accompanied by a family member. Overall, he is feeling well. However, he is experiencing joint stiffness and frequent urination that he attributes to side effects of Jardiance. Also, he continues to suffer from bilateral hand edema.  Since his last visit, he developed episodes of chest pain at night, usually after he goes to sleep around 11 PM to midnight. He feels the pain all the way across his chest, and it may range from mild pain to very strong, but he has not yet used his nitroglycerin. Typical duration is 10-15 minutes, and is always in the same location. During one episode his blood pressure was 187/111. Otherwise his blood pressure is generally stable. His heart rate may be as high as 114 or 99 bpm during the pain. Of note, his chest pain occurred for 4 days in a row, and has not recurred since.  During activity, he endorses shortness of breath while walking up inclines, but denies any exertional chest pain. He also enjoys playing golf often.  He denies any palpitations, lightheadedness, headaches, syncope, orthopnea, PND, or lower extremity edema.  He does not wish to have a repeat chemical stress test. However, he states he may be able to use the treadmill for a stress test.   Past Medical History:  Diagnosis Date   Carotid bruit    Diabetes mellitus  without complication (HCC)    Eczema    Heart failure ([place])    History of colonic polyps    Hypercholesterolemia    Hyperlipidemia    Macular degeneration    Osteoarthritis    Paresthesias    Pseudogout    Subclavian steal syndrome     Past Surgical History:  Procedure Laterality Date   CARDIAC CATHETERIZATION     COLONOSCOPY WITH PROPOFOL N/A 10/24/2016   Procedure: COLONOSCOPY WITH PROPOFOL;  Surgeon: Garlan Fair, MD;  Location: [facility];  Service: Endoscopy;  Laterality: N/A;   HYDROCELE EXCISION / REPAIR     KNEE SURGERY     REFRACTIVE SURGERY     TONSILLECTOMY      Current Medications: Current Outpatient Medications on File Prior to Visit  Medication Sig   aspirin 81 MG chewable tablet Chew by mouth.   B Complex Vitamins (VITAMIN B COMPLEX) TABS Take by mouth.   Cholecalciferol (VITAMIN D3 PO) Take 1 capsule by mouth daily.   colchicine 0.6 MG tablet Take 1 tablet (0.6 mg total) by mouth daily.   CONTOUR NEXT TEST test strip    empagliflozin (JARDIANCE) 10 MG TABS tablet Take 1 tablet (10 mg total) by mouth daily before breakfast.   ENTRESTO 97-103 MG TAKE 1 TABLET BY MOUTH TWICE DAILY   furosemide (LASIX) 20 MG tablet TAKE 1 TABLET BY MOUTH DAILY OR AS DIRECTED GENERIC EQUIVALENT FOR LASIX   hydroxychloroquine (PLAQUENIL) 200 MG tablet See admin instructions.  metFORMIN (GLUCOPHAGE-XR) 500 MG 24 hr tablet Take 1,000 mg by mouth 2 (two) times daily.   metoprolol succinate (TOPROL-XL) 25 MG 24 hr tablet Take 1 tablet (25 mg total) by mouth daily.   Multiple Vitamins-Minerals (PRESERVISION AREDS PO)    REPATHA SURECLICK 701 MG/ML SOAJ ADMINISTER 1 ML(140 MG) UNDER THE SKIN EVERY 14 DAYS   triamcinolone (KENALOG) 0.1 % 1 application to affected area   Turmeric Curcumin 500 MG CAPS Take by mouth.   nitroGLYCERIN (NITROSTAT) 0.4 MG SL tablet Place 1 tablet (0.4 mg total) under the tongue every 5 (five) minutes as needed for chest pain.   No current  facility-administered medications on file prior to visit.     Allergies:   Statins and Tetanus toxoids   Social History   Tobacco Use   Smoking status: Former    Packs/day: 1.00    Years: 59.00    Pack years: 59.00    Types: Cigarettes    Quit date: 09/05/2016    Years since quitting: 4.8   Smokeless tobacco: Never  Vaping Use   Vaping Use: Never used  Substance Use Topics   Alcohol use: Yes    Alcohol/week: 0.0 standard drinks    Comment: Rare   Drug use: No    Family History: family history includes Bipolar disorder in his brother; Cancer in his paternal grandmother; Diabetes in his brother; Heart failure in his father.  ROS:   Please see the history of present illness.   (+) Chest pain (+) Exertional shortness of breath (+) Joint stiffness (+) Urinary frequency (+) Bilateral hand edema Additional pertinent ROS otherwise unremarkable.  EKGs/Labs/Other Studies Reviewed:    The following studies were reviewed today:  CT Chest 06/04/2021: COMPARISON:  04/19/2021   FINDINGS: Cardiovascular: The heart size is normal. No substantial pericardial effusion. Coronary artery calcification is evident. Mild atherosclerotic calcification is noted in the wall of the thoracic aorta.   Mediastinum/Nodes: No mediastinal lymphadenopathy. No evidence for gross hilar lymphadenopathy although assessment is limited by the lack of intravenous contrast on the current study. The esophagus has normal imaging features. There is no axillary lymphadenopathy.   Lungs/Pleura: Centrilobular emphsyema noted. There is some persistent subtle ground-glass opacity in the peripheral right upper lobe posteriorly with associated nodular components of more consolidative involvement. 1 of the dominant nodular component seen in the right upper lobe on image 107/3 previously (measuring about 9 mm when remeasured) has resolved in the interval with only some trace ground-glass opacity at this location  today. Similar improvement in other areas of nodular involvement.   No new suspicious pulmonary nodule or mass. No focal airspace consolidation. No pleural effusion.   Upper Abdomen: Tiny hypodensity lateral segment left liver is stable, too small to characterize but likely benign. Possible tiny hypoattenuating lesion in the tail of pancreas with punctate wall calcification incompletely visualized on image 387/series 4.   Musculoskeletal: No worrisome lytic or sclerotic osseous abnormality.   IMPRESSION: 1. Overall improvement in the relatively large region of ground-glass opacity in the peripheral right upper lobe. The associated nodular consolidative components are improved in the interval. 2. Possible tiny hypoattenuating lesion in the tail of pancreas with punctate wall calcification. This region was not included on any of the prior chest CTs. Consider abdominal MRI with and without contrast for definitive characterization. 3. Aortic Atherosclerosis (ICD10-I70.0) and Emphysema (ICD10-J43.9).  Echo 08/18/20 1. Left ventricular ejection fraction, by estimation, is 50 to 55%. The  left ventricle has low normal function.  The left ventricle has no regional  wall motion abnormalities. Left ventricular diastolic parameters are  consistent with Grade I diastolic  dysfunction (impaired relaxation).   2. Right ventricular systolic function is normal. The right ventricular  size is mildly enlarged.   3. The mitral valve is normal in structure. Trivial mitral valve  regurgitation. No evidence of mitral stenosis.   4. The aortic valve is tricuspid. Aortic valve regurgitation is not  visualized. No aortic stenosis is present.   5. The inferior vena cava is normal in size with greater than 50%  respiratory variability, suggesting right atrial pressure of 3 mmHg.  Stress test 08/14/20 Nuclear stress EF: 51%. The left ventricular ejection fraction is mildly decreased (45-54%). There was  no ST segment deviation noted during stress. Findings consistent with prior myocardial infarction. This is a low risk study.   Small inferior basal wall infarct no ischemia EF 51%   Echo 02/19/19  1. The left ventricle has mild-moderately reduced systolic function, with an ejection fraction of 40-45%. The cavity size was normal. Left ventricular diastolic Doppler parameters are consistent with impaired relaxation. Left ventricular diffuse  hypokinesis, with slightly worse function in the lateral wall.  2. The right ventricle has normal systolic function. The cavity was normal. There is no increase in right ventricular wall thickness. Right ventricular systolic pressure could not be assessed.  3. The aortic valve is tricuspid. Aortic valve regurgitation is trivial by color flow Doppler. No stenosis of the aortic valve.  4. Cannot exclude small PFO by color flow Doppler.  5. There is redundancy of the interatrial septum.  EKG:  EKG is personally reviewed.   06/29/2021: NSR, RBBB at 87 bpm 07/02/20: NSR, RBBB at 77 bpm  Recent Labs: 10/01/2020: TSH 4.47 06/23/2021: ALT 20; BUN 21; Creatinine, Ser 1.17; Hemoglobin 12.1; Platelets 345.0; Potassium 4.7; Pro B Natriuretic peptide (BNP) 41.0; Sodium 134  Recent Lipid Panel    Component Value Date/Time   CHOL 128 06/23/2021 0806   CHOL 120 12/24/2019 1605   TRIG 262.0 (H) 06/23/2021 0806   HDL 48.20 06/23/2021 0806   HDL 48 12/24/2019 1605   CHOLHDL 3 06/23/2021 0806   VLDL 52.4 (H) 06/23/2021 0806   LDLCALC 31 12/24/2019 1605   LDLDIRECT 50.0 06/23/2021 0806    Physical Exam:    VS:  BP 132/74   Pulse 87   Resp (!) 98   Ht 5' 6.5" (1.689 m)   Wt 168 lb 3.2 oz (76.3 kg)   BMI 26.74 kg/m     Wt Readings from Last 3 Encounters:  06/29/21 168 lb 3.2 oz (76.3 kg)  06/21/21 170 lb 9.6 oz (77.4 kg)  11/25/20 164 lb 12.8 oz (74.8 kg)   GEN: Well nourished, well developed in no acute distress HEENT: Normal, moist mucous  membranes NECK: No JVD CARDIAC: regular rhythm, normal S1 and S2, no rubs or gallops. No murmur. VASCULAR: Radial and DP pulses 2+ bilaterally. No carotid bruits RESPIRATORY:  Clear to auscultation without rales, wheezing or rhonchi  ABDOMEN: Soft, non-tender, non-distended MUSCULOSKELETAL:  Ambulates independently SKIN: Warm and dry, no edema in lower extremities. Mild bilateral hand swelling NEUROLOGIC:  Alert and oriented x 3. No focal neuro deficits noted. PSYCHIATRIC:  Normal affect   ASSESSMENT:    1. Chest pain of uncertain etiology   2. Chronic combined systolic and diastolic CHF (congestive heart failure) ([place])   3. Ischemic cardiomyopathy   4. Mixed hyperlipidemia   5. Statin intolerance  6. Type 2 diabetes mellitus with other circulatory complication, without long-term current use of insulin ([place])   7. Coronary artery disease involving native coronary artery of native heart, unspecified whether angina present     PLAN:    Nocturnal chest pain: atypical in nature, but does have past history/risk factors -he will trial antacids to see if it helps -also instructed on use of nitro -if it does not resolved, will need stress test. He does not ever want to have chemical stress again. 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 He is re-trialing Jardiance. Notes joint stiffness, but able to tolerate.   CAD (predominantly RCA, but diffuse mild disease) -on aspirin 81 mg -avoid long term NSAIDs given CAD -lipids as below -discussed red flag warning signs that need immediate medical attention, such as chest tightness that does not resolve with  rest  Mixed hyperlipidemia, statin intolerance: -LDL goal <70 -doing well on repatha, last LDL 50 -could not afford vascepa at >$500 -last TG 262, but focusing on lifestyle/diabetes control. Continue to monitor   Type II diabetes, was on long term insulin and now off of this. - A1c 10.6, restarted on Jardiance -has neuropathy as diabetic complication  Arm/hand swelling: trialed on plaquenil without improvement.  Cardiac risk counseling and prevention recommendations: -recommend heart healthy/Mediterranean diet, with whole grains, fruits, vegetable, fish, lean meats, nuts, and olive oil. Limit salt. -recommend moderate walking, 3-5 times/week for 30-50 minutes each session. Aim for at least 150 minutes.week. Goal should be pace of 3 miles/hours, or walking 1.5 miles in 30 minutes -recommend avoidance of tobacco products. Avoid excess alcohol.  Plan for follow up: 3 mos or sooner as needed  Buford Dresser, MD, PhD, [facility]   Medication Adjustments/Labs and Tests Ordered: Current medicines are reviewed at length with the patient today.  Concerns regarding medicines are outlined above.  Orders Placed This Encounter  Procedures   EKG 12-Lead    No orders of the defined types were placed in this encounter.   Patient Instructions  Medication Instructions:  No Changes today  *If you need a refill on your cardiac medications before your next appointment, please call your pharmacy*   Lab Work: None Today  Testing/Procedures: None ordered   Follow-Up: At [facility], you and your health needs are our priority.  As part of our continuing mission to provide you with exceptional heart care, we have created designated Provider Care Teams.  These Care Teams include your primary Cardiologist (physician) and Advanced Practice Providers (APPs -  Physician Assistants and Nurse Practitioners) who all work together to provide you with the care you need,  when you need it.  We recommend signing up for the patient portal called "MyChart".  Sign up information is provided on this After Visit Summary.  MyChart is used to connect with patients for Virtual Visits (Telemedicine).  Patients are able to view lab/test results, encounter notes, upcoming appointments, etc.  Non-urgent messages can be sent to your provider as well.   To learn more about what you can do with MyChart, go to [email or web address].    Your next appointment:   3 month(s)  The format for your next appointment:  In Person  Provider:   Buford Dresser, MD   Other Instructions Try tums/rolaids when you have pain at night and see if it helps. You can take nitro, just remember that your blood pressure might drop, so you may get dizzy/lightheaded. Please call MD if GERD symptoms do not resolve or worsen.   If this doesn't work, let me know. We will do a treadmill nuclear stress test as we discussed today.  If you have severe symptoms, call 911/come to [facility] ER (they can send you to [facility] if needed).   I,Mathew Stumpf,acting as a Education administrator for [employer], MD.,have documented all relevant documentation on the behalf of Buford Dresser, MD,as directed by  Buford Dresser, MD while in the presence of Buford Dresser, MD.  I, Buford Dresser, MD, have reviewed all documentation for this visit. The documentation on 06/30/21 for the exam, diagnosis, procedures, and orders are all accurate and complete.   Signed, Buford Dresser, MD PhD 06/30/2021    [facility]

## 2021-06-29 NOTE — Patient Instructions (Signed)
Medication Instructions:  No Changes today  *If you need a refill on your cardiac medications before your next appointment, please call your pharmacy*   Lab Work: None Today  Testing/Procedures: None ordered   Follow-Up: At [facility], you and your health needs are our priority.  As part of our continuing mission to provide you with exceptional heart care, we have created designated Provider Care Teams.  These Care Teams include your primary Cardiologist (physician) and Advanced Practice Providers (APPs -  Physician Assistants and Nurse Practitioners) who all work together to provide you with the care you need, when you need it.  We recommend signing up for the patient portal called "MyChart".  Sign up information is provided on this After Visit Summary.  MyChart is used to connect with patients for Virtual Visits (Telemedicine).  Patients are able to view lab/test results, encounter notes, upcoming appointments, etc.  Non-urgent messages can be sent to your provider as well.   To learn more about what you can do with MyChart, go to [email or web address].    Your next appointment:   3 month(s)  The format for your next appointment:   In Person  Provider:   Buford Dresser, MD   Other Instructions Try tums/rolaids when you have pain at night and see if it helps. You can take nitro, just remember that your blood pressure might drop, so you may get dizzy/lightheaded. Please call MD if GERD symptoms do not resolve or worsen.   If this doesn't work, let me know. We will do a treadmill nuclear stress test as we discussed today.  If you have severe symptoms, call 911/come to [facility] ER (they can send you to [facility] if needed).

## 2021-06-30 ENCOUNTER — Encounter (HOSPITAL_BASED_OUTPATIENT_CLINIC_OR_DEPARTMENT_OTHER): Payer: Self-pay | Admitting: Cardiology

## 2021-07-05 ENCOUNTER — Other Ambulatory Visit: Payer: Self-pay

## 2021-07-05 MED ORDER — REPATHA SURECLICK 140 MG/ML ~~LOC~~ SOAJ: SUBCUTANEOUS | 3 refills | Status: DC

## 2021-07-12 ENCOUNTER — Encounter: Payer: Self-pay | Admitting: Family Medicine

## 2021-07-12 NOTE — Progress Notes (Signed)
Phone [phone] In person visit   Subjective:   Casey Lynch is a 75 y.o. year old very pleasant male patient who presents for/with See problem oriented charting Chief Complaint  Patient presents with   Follow-up   Diabetes    Pt is following up on blood sugars and states he is stiff and he thinks its form Jardiance.    This visit occurred during the SARS-CoV-2 public health emergency.  Safety protocols were in place, including screening questions prior to the visit, additional usage of staff PPE, and extensive cleaning of exam room while observing appropriate contact time as indicated for disinfecting solutions.   Past Medical History-  Patient Active Problem List   Diagnosis Date Noted   Diabetes type 2, uncontrolled 10/01/2020    Priority: High   Ischemic cardiomyopathy 07/25/2019    Priority: High   Chronic combined systolic and diastolic heart failure ([place]) 07/12/2018    Priority: High   Coronary artery disease due to lipid rich plaque 07/12/2018    Priority: High   Pseudogout involving multiple joints 10/01/2020    Priority: Medium    Mixed hyperlipidemia 07/12/2018    Priority: Medium    Statin intolerance 07/12/2018    Priority: Medium    Pulmonary HTN ([place]) 06/14/2018    Priority: Medium    COVID-19 07/13/2021    Priority: Low   Personal history of COVID-19 10/25/2020    Priority: Low   Pulmonary emphysema ([place]) 06/14/2018    Priority: Low    Medications- reviewed and updated Current Outpatient Medications  Medication Sig Dispense Refill   aspirin 81 MG chewable tablet Chew by mouth.     B Complex Vitamins (VITAMIN B COMPLEX) TABS Take by mouth.     Cholecalciferol (VITAMIN D3 PO) Take 1 capsule by mouth daily.     colchicine 0.6 MG tablet Take 1 tablet (0.6 mg total) by mouth daily. 90 tablet 0   CONTOUR NEXT TEST test strip      empagliflozin (JARDIANCE) 10 MG TABS tablet Take 1 tablet (10 mg total) by mouth daily before breakfast. 90 tablet 2    ENTRESTO 97-103 MG TAKE 1 TABLET BY MOUTH TWICE DAILY 180 tablet 3   Evolocumab (REPATHA SURECLICK) 063 MG/ML SOAJ ADMINISTER 1 ML(140 MG) UNDER THE SKIN EVERY 14 DAYS 6 mL 3   furosemide (LASIX) 20 MG tablet TAKE 1 TABLET BY MOUTH DAILY OR AS DIRECTED GENERIC EQUIVALENT FOR LASIX 90 tablet 3   hydroxychloroquine (PLAQUENIL) 200 MG tablet See admin instructions.     Insulin Glargine (BASAGLAR KWIKPEN) 100 UNIT/ML Inject 10-20 Units into the skin daily. Please provide pen needles 15 mL 5   metFORMIN (GLUCOPHAGE-XR) 500 MG 24 hr tablet Take 1,000 mg by mouth 2 (two) times daily.     metoprolol succinate (TOPROL-XL) 25 MG 24 hr tablet Take 1 tablet (25 mg total) by mouth daily. 90 tablet 3   Multiple Vitamins-Minerals (PRESERVISION AREDS PO)      triamcinolone (KENALOG) 0.1 % 1 application to affected area     Turmeric Curcumin 500 MG CAPS Take by mouth.     nitroGLYCERIN (NITROSTAT) 0.4 MG SL tablet Place 1 tablet (0.4 mg total) under the tongue every 5 (five) minutes as needed for chest pain. 90 tablet 3   No current facility-administered medications for this visit.     Objective:  BP 112/64   Pulse 80   Temp (!) 97.2 F (36.2 C)   Ht 5' 6.5" (1.689 m)  Wt 166 lb 12.8 oz (75.7 kg)   SpO2 98%   BMI 26.52 kg/m  Gen: NAD, resting comfortably CV: RRR no murmurs rubs or gallops Lungs: CTAB no crackles, wheeze, rhonchi Ext: no edema Skin: warm, dry    Assessment and Plan   # covid 19- diagnosed last week - about 8 days in- did well with monupiravir- no lingering symptoms. Wife has recovered as well  #Uncontrolled type 2 diabetes mellitus with hyperglycemia  also with neuropathy (not discussing neuropathy today) S: Medication: Metformin 1000 mg extended release twice daily with Jardiance 10 mg added last visit (some joint stiffness with this).  We had also discussed adding insulin he had been on Basaglar prior weight loss in the past -Patient was to work on dietary changes  Side  effects with other medications: -Trulicity 1.00 mg once a week but started experiencing nausea and GERD symptoms.  -  Hypoglycemia with Amaryl.  Plan was to start insulin CBGs- morning sugars range from 179 to 299 mostly right around 200-220  Exercise and diet- moderate improvements on diet. Exercise was down with covid- not walking as much Lab Results  Component Value Date   HGBA1C 10.6 (A) 06/21/2021   HGBA1C 9.0 (A) 11/25/2020   HGBA1C 8.6 (H) 10/01/2020   A/P: diabetes ill poorly controlled with metformin max dose nad jardiance 10mg . Getting some joint stiffness with jardiance so do not want to go to 25 mg dose and in fact wants to see if we can reduce (prefer to stay on if possible with CHF).  - we will add basaglar 10-20 units but start at 10 units  in morning(has bene up to 20 units in the past)   #emphysema/copd- asymptomatic- continue to monitor. 59 years of smoking  Recommended follow up: Return in about 3 months (around 10/19/2021). Future Appointments  Date Time Provider [place]  08/16/2021  9:30 AM [facility] CCM PHARMACIST [facility]  08/16/2021 11:45 AM [facility] HEALTH COACH [facility]  09/27/2021 10:20 AM Buford Dresser, MD [facility]   Lab/Order associations:   ICD-10-CM   1. Uncontrolled type 2 diabetes mellitus with hyperglycemia (HCC)  E11.65     2. Pulmonary emphysema, unspecified emphysema type ([place])  J43.9       Meds ordered this encounter  Medications   Insulin Glargine (BASAGLAR KWIKPEN) 100 UNIT/ML    Sig: Inject 10-20 Units into the skin daily. Please provide pen needles    Dispense:  15 mL    Refill:  5   I,Jada Bradford,acting as a scribe for Garret Reddish, MD.,have documented all relevant documentation on the behalf of Garret Reddish, MD,as directed by  Garret Reddish, MD while in the presence of Garret Reddish, MD.  I, Garret Reddish, MD, have reviewed all documentation for this visit. The documentation on 07/19/21 for the  exam, diagnosis, procedures, and orders are all accurate and complete.  Return precautions advised.  Garret Reddish, MD

## 2021-07-13 ENCOUNTER — Telehealth (INDEPENDENT_AMBULATORY_CARE_PROVIDER_SITE_OTHER): Payer: Medicare Other | Admitting: Family

## 2021-07-13 ENCOUNTER — Encounter: Payer: Self-pay | Admitting: Family

## 2021-07-13 VITALS — Ht 66.5 in | Wt 168.2 lb

## 2021-07-13 DIAGNOSIS — U071 COVID-19: Secondary | ICD-10-CM

## 2021-07-13 MED ORDER — MOLNUPIRAVIR EUA 200MG CAPSULE: 4.0000 | ORAL_CAPSULE | Freq: Two times a day (BID) | ORAL | 0 refills | Status: AC

## 2021-07-13 NOTE — Progress Notes (Signed)
MyChart Video Visit    Virtual Visit via Video Note   This visit type was conducted due to national recommendations for restrictions regarding the COVID-19 Pandemic (e.g. social distancing) in an effort to limit this patient's exposure and mitigate transmission in our community. This patient is at least at moderate risk for complications without adequate follow up. This format is felt to be most appropriate for this patient at this time. Physical exam was limited by quality of the video and audio technology used for the visit. CMA was able to get the patient set up on a video visit.  Patient location: Home. Patient and provider in visit Provider location: Office  I discussed the limitations of evaluation and management by telemedicine and the availability of in person appointments. The patient expressed understanding and agreed to proceed.  Visit Date: 07/13/2021  Today's healthcare provider: Jeanie Sewer, NP     Subjective:    Patient ID: Casey Lynch, male    DOB: 09/15/1945, 75 y.o.   MRN: [id number]  Chief Complaint  Patient presents with   Covid Positive    Symptoms started Monday.    Nasal Congestion   Cough    Mild   HPI Upper Respiratory Infection: Symptoms include congestion, fever 102.0, and non productive cough.  Onset of symptoms was 2 days ago, gradually worsened since that time. He is drinking moderate amounts of fluids. Evaluation to date: none.  Treatment to date:  Tylenol .    Past Medical History:  Diagnosis Date   Carotid bruit    Diabetes mellitus without complication (HCC)    Eczema    Heart failure ([place])    History of colonic polyps    Hypercholesterolemia    Hyperlipidemia    Macular degeneration    Osteoarthritis    Paresthesias    Pseudogout    Subclavian steal syndrome     Past Surgical History:  Procedure Laterality Date   CARDIAC CATHETERIZATION     COLONOSCOPY WITH PROPOFOL N/A 10/24/2016   Procedure: COLONOSCOPY WITH  PROPOFOL;  Surgeon: Garlan Fair, MD;  Location: [facility];  Service: Endoscopy;  Laterality: N/A;   HYDROCELE EXCISION / REPAIR     KNEE SURGERY     REFRACTIVE SURGERY     TONSILLECTOMY      Outpatient Medications Prior to Visit  Medication Sig Dispense Refill   aspirin 81 MG chewable tablet Chew by mouth.     B Complex Vitamins (VITAMIN B COMPLEX) TABS Take by mouth.     Cholecalciferol (VITAMIN D3 PO) Take 1 capsule by mouth daily.     colchicine 0.6 MG tablet Take 1 tablet (0.6 mg total) by mouth daily. 90 tablet 0   CONTOUR NEXT TEST test strip      empagliflozin (JARDIANCE) 10 MG TABS tablet Take 1 tablet (10 mg total) by mouth daily before breakfast. 90 tablet 2   ENTRESTO 97-103 MG TAKE 1 TABLET BY MOUTH TWICE DAILY 180 tablet 3   Evolocumab (REPATHA SURECLICK) 295 MG/ML SOAJ ADMINISTER 1 ML(140 MG) UNDER THE SKIN EVERY 14 DAYS 6 mL 3   furosemide (LASIX) 20 MG tablet TAKE 1 TABLET BY MOUTH DAILY OR AS DIRECTED GENERIC EQUIVALENT FOR LASIX 90 tablet 3   hydroxychloroquine (PLAQUENIL) 200 MG tablet See admin instructions.     metFORMIN (GLUCOPHAGE-XR) 500 MG 24 hr tablet Take 1,000 mg by mouth 2 (two) times daily.     metoprolol succinate (TOPROL-XL) 25 MG 24 hr tablet Take 1  tablet (25 mg total) by mouth daily. 90 tablet 3   Multiple Vitamins-Minerals (PRESERVISION AREDS PO)      triamcinolone (KENALOG) 0.1 % 1 application to affected area     Turmeric Curcumin 500 MG CAPS Take by mouth.     nitroGLYCERIN (NITROSTAT) 0.4 MG SL tablet Place 1 tablet (0.4 mg total) under the tongue every 5 (five) minutes as needed for chest pain. 90 tablet 3   No facility-administered medications prior to visit.    Allergies  Allergen Reactions   Statins Other (See Comments)    Terrible joint pain   Tetanus Toxoids Other (See Comments)    Fever 104/105 & arm stiffiness        Objective:     Physical Exam Vitals and nursing note reviewed.  Constitutional:      General: She is  not in acute distress.    Appearance: Normal appearance.  HENT:     Head: Normocephalic.  Pulmonary:     Effort: No respiratory distress.  Musculoskeletal:     Cervical back: Normal range of motion.  Skin:    General: Skin is dry.     Coloration: Skin is not pale.  Neurological:     Mental Status: She is alert and oriented to person, place, and time.  Psychiatric:        Mood and Affect: Mood normal.   Ht 5' 6.5" (1.689 m)   Wt 168 lb 3.4 oz (76.3 kg)   BMI 26.74 kg/m   Wt Readings from Last 3 Encounters:  07/13/21 168 lb 3.4 oz (76.3 kg)  06/29/21 168 lb 3.2 oz (76.3 kg)  06/21/21 170 lb 9.6 oz (77.4 kg)       Assessment & Plan:   Problem List Items Addressed This Visit       Other   COVID-19 - Primary    Sending Molnupiravir, wife is also positive and started this med yesterday & tolerating. Advised of CDC guidelines for self isolation/ ending isolation.  Advised of safe practice guidelines. Symptom Tier reviewed.  Encouraged to monitor for any worsening symptoms; watch for increased shortness of breath, weakness, and signs of dehydration. Advised when to seek emergency care.  Instructed to rest and hydrate well.  Advised to leave the house during recommended isolation period, only if it is necessary to seek medical care       Relevant Medications   molnupiravir EUA (LAGEVRIO) 200 mg CAPS capsule    Meds ordered this encounter  Medications   molnupiravir EUA (LAGEVRIO) 200 mg CAPS capsule    Sig: Take 4 capsules (800 mg total) by mouth 2 (two) times daily for 5 days.    Dispense:  40 capsule    Refill:  0    Order Specific Question:   Supervising Provider    Answer:   ANDY, CAMILLE L [[id number]]    I discussed the assessment and treatment plan with the patient. The patient was provided an opportunity to ask questions and all were answered. The patient agreed with the plan and demonstrated an understanding of the instructions.   The patient was advised to call back  or seek an in-person evaluation if the symptoms worsen or if the condition fails to improve as anticipated.  I provided 20 minutes of face-to-face time during this encounter.   Jeanie Sewer, NP [facility] [phone] (phone) [phone] (fax)  [place]

## 2021-07-13 NOTE — Assessment & Plan Note (Signed)
Sending Molnupiravir, wife is also positive and started this med yesterday & tolerating. Advised of CDC guidelines for self isolation/ ending isolation.  Advised of safe practice guidelines. Symptom Tier reviewed.  Encouraged to monitor for any worsening symptoms; watch for increased shortness of breath, weakness, and signs of dehydration. Advised when to seek emergency care.  Instructed to rest and hydrate well.  Advised to leave the house during recommended isolation period, only if it is necessary to seek medical care

## 2021-07-14 ENCOUNTER — Telehealth: Payer: Self-pay | Admitting: Family Medicine

## 2021-07-14 NOTE — Progress Notes (Signed)
  Care Management  Note   07/14/2021 Name: BRANDUN PINN MRN: [id number] DOB: [birth year]  LELON IKARD is a 75 y.o. year old male who is a primary care patient of Yong Channel, Brayton Mars, MD. The care management team was consulted for assistance with chronic disease management and care coordination needs.   Mr. Paras was given information about Care Management services today including:  CCM service includes personalized support from designated clinical staff supervised by the physician, including individualized plan of care and coordination with other care providers 24/7 contact phone numbers for assistance for urgent and routine care needs. Service will only be billed when office clinical staff spend 20 minutes or more in a month to coordinate care. Only one practitioner may furnish and bill the service in a calendar month. The patient may stop CCM services at amy time (effective at the end of the month) by phone call to the office staff. The patient will be responsible for cost sharing (co-pay) or up to 20% of the service fee (after annual deductible is met)  Patient agreed to services and verbal consent obtained.  Follow up plan:   Face to Face appointment with care management team member scheduled for: 08/16/21 $RemoveBefor'@930am'[email or web address]$   Noelle Penner Upstream Scheduler

## 2021-07-19 ENCOUNTER — Encounter: Payer: Self-pay | Admitting: Family Medicine

## 2021-07-19 ENCOUNTER — Other Ambulatory Visit: Payer: Self-pay

## 2021-07-19 ENCOUNTER — Ambulatory Visit (INDEPENDENT_AMBULATORY_CARE_PROVIDER_SITE_OTHER): Payer: Medicare Other | Admitting: Family Medicine

## 2021-07-19 VITALS — BP 112/64 | HR 80 | Temp 97.2°F | Ht 66.5 in | Wt 166.8 lb

## 2021-07-19 DIAGNOSIS — J439 Emphysema, unspecified: Secondary | ICD-10-CM | POA: Diagnosis not present

## 2021-07-19 DIAGNOSIS — I2510 Atherosclerotic heart disease of native coronary artery without angina pectoris: Secondary | ICD-10-CM

## 2021-07-19 DIAGNOSIS — E782 Mixed hyperlipidemia: Secondary | ICD-10-CM

## 2021-07-19 DIAGNOSIS — E1165 Type 2 diabetes mellitus with hyperglycemia: Secondary | ICD-10-CM

## 2021-07-19 DIAGNOSIS — I5042 Chronic combined systolic (congestive) and diastolic (congestive) heart failure: Secondary | ICD-10-CM

## 2021-07-19 DIAGNOSIS — K2100 Gastro-esophageal reflux disease with esophagitis, without bleeding: Secondary | ICD-10-CM

## 2021-07-19 DIAGNOSIS — Z87891 Personal history of nicotine dependence: Secondary | ICD-10-CM

## 2021-07-19 DIAGNOSIS — M1120 Other chondrocalcinosis, unspecified site: Secondary | ICD-10-CM

## 2021-07-19 LAB — COMPREHENSIVE METABOLIC PANEL
ALT: 16 U/L (ref 0–53)
AST: 14 U/L (ref 0–37)
Albumin: 4.3 g/dL (ref 3.5–5.2)
Alkaline Phosphatase: 88 U/L (ref 39–117)
BUN: 30 mg/dL — ABNORMAL HIGH (ref 6–23)
CO2: 27 mEq/L (ref 19–32)
Calcium: 10 mg/dL (ref 8.4–10.5)
Chloride: 98 mEq/L (ref 96–112)
Creatinine, Ser: 1.29 mg/dL (ref 0.40–1.50)
GFR: 53.96 mL/min — ABNORMAL LOW (ref >60.00)
Glucose, Bld: 219 mg/dL — ABNORMAL HIGH (ref 70–99)
Potassium: 4.5 mEq/L (ref 3.5–5.1)
Sodium: 135 mEq/L (ref 135–145)
Total Bilirubin: 0.3 mg/dL (ref 0.2–1.2)
Total Protein: 7.6 g/dL (ref 6.0–8.3)

## 2021-07-19 MED ORDER — BASAGLAR KWIKPEN 100 UNIT/ML ~~LOC~~ SOPN: 10.0000 [IU] | PEN_INJECTOR | Freq: Every day | SUBCUTANEOUS | 5 refills | Status: DC

## 2021-07-19 NOTE — Patient Instructions (Addendum)
we will add basaglar 10-20 units but start at 10 units  in morning. Update me in 1-2  weeks with blood sugars. We will try to get morning sugars between 100-120 BUT if you get any sugars under 70 stop insulin and let me know ASAP  Continue metformin and jardiance  Please stop by lab before you go If you have mychart- we will send your results within 3 business days of [place] receiving them.  If you do not have mychart- we will call you about results within 5 business days of [place] receiving them.  *please also note that you will see labs on mychart as soon as they post. I will later go in and write notes on them- will say "notes from Dr. Yong Channel"  Recommended follow up: Return in about 3 months (around 10/19/2021).

## 2021-07-20 ENCOUNTER — Other Ambulatory Visit: Payer: Self-pay

## 2021-07-20 ENCOUNTER — Encounter: Payer: Self-pay | Admitting: Family Medicine

## 2021-07-20 MED ORDER — INSULIN PEN NEEDLE 31G X 8 MM MISC: 2 refills | Status: DC

## 2021-08-02 ENCOUNTER — Telehealth (HOSPITAL_BASED_OUTPATIENT_CLINIC_OR_DEPARTMENT_OTHER): Payer: Self-pay | Admitting: Cardiology

## 2021-08-02 NOTE — Telephone Encounter (Signed)
Received fax from [facility] stating pt has been approved for Repaths 140 MG/ML [place] SOAJ through 08/02/2022.

## 2021-08-09 ENCOUNTER — Telehealth: Payer: Self-pay | Admitting: Pharmacist

## 2021-08-09 DIAGNOSIS — M150 Primary generalized (osteo)arthritis: Secondary | ICD-10-CM | POA: Diagnosis not present

## 2021-08-09 DIAGNOSIS — M1120 Other chondrocalcinosis, unspecified site: Secondary | ICD-10-CM | POA: Diagnosis not present

## 2021-08-09 DIAGNOSIS — M0609 Rheumatoid arthritis without rheumatoid factor, multiple sites: Secondary | ICD-10-CM | POA: Diagnosis not present

## 2021-08-09 DIAGNOSIS — M2550 Pain in unspecified joint: Secondary | ICD-10-CM | POA: Diagnosis not present

## 2021-08-09 NOTE — Chronic Care Management (AMB) (Signed)
Chronic Care Management Pharmacy Assistant   Name: Casey Lynch  MRN: [id number] DOB: [birth year]   Reason for Encounter: Chart Review For Initial Visit With Clinical Pharmacist   Conditions to be addressed/monitored: HTN, CAD, CHF, DM II, HLD  Primary concerns for visit include: HTN, CHF, DM II, HLD  Recent office visits:  07/19/2021 [facility] (PCP) Marin Olp, MD; Metformin 1000 mg extended release twice daily with Jardiance 10 mg added last visit (some joint stiffness with this).  We had also discussed adding insulin he had been on Basaglar prior weight loss in the past. -Trulicity 1.77 mg once a week but started experiencing nausea and GERD symptoms. - we will add basaglar 10-20 units but start at 10 units  in morning(has bene up to 20 units in the past)   07/13/2021 VV Hudnell, Colletta [place], NP; molnupiravir 200 mg take 4 capsules 2 times daily for 5 days for COVID-19  06/21/2021 [facility] (PCP) Marin Olp, MD; no medication changes indicated.  Recent consult visits:  06/29/2021 [facility] (cardiology) Buford Dresser, MD; no medication changes indicated.  05/17/2021 [facility] (podiatry) Wallene Huh, DPM; no medication changes indicated.  Hospital visits:  02/26/2021 ED Visit for Rib pain on left side Rx for Tylenol, Robaxin and Tramadol for breakthrough pain, rib contusion  Medications: Outpatient Encounter Medications as of 08/09/2021  Medication Sig   Insulin Pen Needle 31G X 8 MM MISC Use to inject Basaglar daily. Dx:E11.9   aspirin 81 MG chewable tablet Chew by mouth.   B Complex Vitamins (VITAMIN B COMPLEX) TABS Take by mouth.   Cholecalciferol (VITAMIN D3 PO) Take 1 capsule by mouth daily.   colchicine 0.6 MG tablet Take 1 tablet (0.6 mg total) by mouth daily.   CONTOUR NEXT TEST test strip    empagliflozin (JARDIANCE) 10 MG TABS tablet Take 1 tablet (10 mg total) by mouth daily before breakfast.   ENTRESTO 97-103 MG TAKE 1 TABLET BY MOUTH TWICE DAILY   Evolocumab  (REPATHA SURECLICK) 939 MG/ML SOAJ ADMINISTER 1 ML(140 MG) UNDER THE SKIN EVERY 14 DAYS   furosemide (LASIX) 20 MG tablet TAKE 1 TABLET BY MOUTH DAILY OR AS DIRECTED GENERIC EQUIVALENT FOR LASIX   hydroxychloroquine (PLAQUENIL) 200 MG tablet See admin instructions.   Insulin Glargine (BASAGLAR KWIKPEN) 100 UNIT/ML Inject 10-20 Units into the skin daily. Please provide pen needles   metFORMIN (GLUCOPHAGE-XR) 500 MG 24 hr tablet Take 1,000 mg by mouth 2 (two) times daily.   metoprolol succinate (TOPROL-XL) 25 MG 24 hr tablet Take 1 tablet (25 mg total) by mouth daily.   Multiple Vitamins-Minerals (PRESERVISION AREDS PO)    nitroGLYCERIN (NITROSTAT) 0.4 MG SL tablet Place 1 tablet (0.4 mg total) under the tongue every 5 (five) minutes as needed for chest pain.   triamcinolone (KENALOG) 0.1 % 1 application to affected area   Turmeric Curcumin 500 MG CAPS Take by mouth.   No facility-administered encounter medications on file as of 08/09/2021.   Current Medications: Basaglar Kwikpen last filled 07/20/2021 68 DS Repatha 140 mg last filled 06/02/2021 84 DS Jardiance 10 mg last filled 06/21/2021 90 DS  Entresto 97-103 mg last filled 07/06/2021 90 DS Preservision Plaquenil 200 mg last filled 07/05/2021 90 DS Colchicine 0.6 mg last filled 07/07/2021 90 DS Furosemide 20 mg last filled 10/05/2020 90 DS Triamcinolone 0.1% cream last filled 02/11/2021 30 DS Nitroglycerin 0.4 mg last filled 08/13/2020 90 DS Metoprolol Succinate 25 mg last filled 07/06/2021 90 DS Vitamin D3 Vitamin B Complex  Turmeric Curcumin 500 mg on hold per patient Metformin 500 mg last filled 06/23/2021 90 DS Aspirin 81 mg  Patient Questions: Any changes in your medications or health? 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  Any side effects from any medications?  Patient states medication Vania Rea has caused him "lots of problems." He states the medication has caused his knees, hips and shoulders  to be stiff. He said he had the same reaction from a statin medication. He said he's been taking this medication for about a month and the longer he takes it the more he is stiff.  Do you have any symptoms or problems not managed by your medications? Nothing other than "Stiffness from [place]."  Any concerns about your health right now? Patient states his A1c is elevated and he has a lot of stiffness that bothers him. He said the pain in his joints wakes him up at night.  Has your provider asked that you check blood pressure, blood sugar, or follow special diet at home? "I've been checking my blood sugars everyday once a day. My blood pressure is always good so I don't check that. I try to watch my sugar intake a bit."  Do you get any type of exercise on a regular basis? Patient states he walks 10,000 steps two to three times a week.  Can you think of a goal you would like to reach for your health? "I would like to get back to 10,000 steps a day."  Do you have any problems getting your medications? 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 He goes into the donut hole in February and usually spends $1800 out of pocket each year.  Is there anything that you would like to discuss during the appointment?  He would like to discuss his medications and some side effects he is currently experiencing.  Please bring medications and supplements to appointment   Care Gaps: Medicare Annual Wellness: Scheduled Ophthalmology Exam: Next due on 05/27/2022 Foot Exam: Next due on 06/21/2022 Hemoglobin A1C: 10.6% on 06/21/2021 Colonoscopy: Completed 10/24/2016  Future Appointments  Date Time Provider [place]  08/16/2021  9:30 AM [facility] CCM PHARMACIST [facility]  08/16/2021 11:45 AM [facility] HEALTH COACH [facility]  09/27/2021 10:20 AM Buford Dresser, MD [facility]  10/14/2021  9:20 AM Marin Olp, MD [facility]    Star Rating Drugs: [facility] last filled 07/20/2021 68  DS Jardiance 10 mg last filled 06/21/2021 90 DS  Entresto 97-103 mg last filled 07/06/2021 90 DS Metformin 500 mg last filled 06/23/2021 90 DS  April D Calhoun, [place] Pharmacist Assistant [phone]

## 2021-08-13 NOTE — Progress Notes (Signed)
Chronic Care Management Pharmacy Note  08/16/2021 Name:  Casey Lynch MRN:  [id number] DOB:  19-Jun-1946  Summary: Initial visit with PharmD.  Patient A1c 10.6  - he has stopped Jardiance completely due to muscle stiffness.  Two days after stopping symptoms resolved.  He was using 10 units of Basaglar and taking metformin.  Fasting sugars 120-243 - majority 150-160.  No hypoglycemia at all.  Also mentions some neuropathy in his feet especially at night.  Recommendations/Changes made from today's visit: Consider increasing Basaglar to 15 units daily Consider another trial of Gabapentin 353m hs - he does not remember symptoms he had previously   Plan: FU glucose in 30 days PharmD 4 months   Subjective: Casey DORITYis an 75y.o. year old male who is a primary patient of Hunter, SBrayton Mars MD.  The CCM team was consulted for assistance with disease management and care coordination needs.    Engaged with patient face to face for initial visit in response to provider referral for pharmacy case management and/or care coordination services.   Consent to Services:  The patient was given the following information about Chronic Care Management services today, agreed to services, and gave verbal consent: 1. CCM service includes personalized support from designated clinical staff supervised by the primary care provider, including individualized plan of care and coordination with other care providers 2. 24/7 contact phone numbers for assistance for urgent and routine care needs. 3. Service will only be billed when office clinical staff spend 20 minutes or more in a month to coordinate care. 4. Only one practitioner may furnish and bill the service in a calendar month. 5.The patient may stop CCM services at any time (effective at the end of the month) by phone call to the office staff. 6. The patient will be responsible for cost sharing (co-pay) of up to 20% of the service fee (after annual deductible  is met). Patient agreed to services and consent obtained.  Patient Care Team: HMarin Olp MD as PCP - General (Family Medicine) CBuford Dresser MD as PCP - Cardiology (Cardiology) HGavin Pound MD as Consulting Physician (Rheumatology) MMarshell Garfinkel MD as Consulting Physician (Pulmonary Disease) JDanella Sensing MD as Consulting Physician (Dermatology) DEdythe Clarity [facility] Centeras Pharmacist (Pharmacist)  Recent office visits:  07/19/2021 OV (PCP) HMarin Olp MD; Metformin 1000 mg extended release twice daily with Jardiance 10 mg added last visit (some joint stiffness with this).  We had also discussed adding insulin he had been on Basaglar prior weight loss in the past. -Trulicity 04.70mg once a week but started experiencing nausea and GERD symptoms. - we will add basaglar 10-20 units but start at 10 units  in morning(has bene up to 20 units in the past)    07/13/2021 VV Hudnell, SColletta Maryland NP; molnupiravir 200 mg take 4 capsules 2 times daily for 5 days for COVID-19   06/21/2021 OV (PCP) HMarin Olp MD; no medication changes indicated.   Recent consult visits:  06/29/2021 OV (cardiology) CBuford Dresser MD; no medication changes indicated.   05/17/2021 OV (podiatry) RWallene Huh DPM; no medication changes indicated.   Hospital visits:  02/26/2021 ED Visit for Rib pain on left side Rx for Tylenol, Robaxin and Tramadol for breakthrough pain, rib contusion   Objective:  Lab Results  Component Value Date   CREATININE 1.29 07/19/2021   BUN 30 (H) 07/19/2021   GFR 53.96 (L) 07/19/2021   GFRNONAA 58 (L) 12/24/2019   GFRAA  67 12/24/2019   NA 135 07/19/2021   K 4.5 07/19/2021   CALCIUM 10.0 07/19/2021   CO2 27 07/19/2021   GLUCOSE 219 (H) 07/19/2021    Lab Results  Component Value Date/Time   HGBA1C 10.6 (A) 06/21/2021 03:27 PM   HGBA1C 9.0 (A) 11/25/2020 11:14 AM   HGBA1C 8.6 (H) 10/01/2020 09:53 AM   HGBA1C 7.2 (H) 12/24/2019 04:05  PM   GFR 53.96 (L) 07/19/2021 10:39 AM   GFR 60.70 06/23/2021 08:06 AM   MICROALBUR <0.7 10/01/2020 09:53 AM    Last diabetic Eye exam: No results found for: HMDIABEYEEXA  Last diabetic Foot exam: No results found for: HMDIABFOOTEX   Lab Results  Component Value Date   CHOL 128 06/23/2021   HDL 48.20 06/23/2021   LDLCALC 31 12/24/2019   LDLDIRECT 50.0 06/23/2021   TRIG 262.0 (H) 06/23/2021   CHOLHDL 3 06/23/2021    Hepatic Function Latest Ref Rng & Units 07/19/2021 06/23/2021 11/25/2020  Total Protein 6.0 - 8.3 g/dL 7.6 7.1 6.6  Albumin 3.5 - 5.2 g/dL 4.3 4.2 4.1  AST 0 - 37 U/L 14 15 11   ALT 0 - 53 U/L 16 20 13   Alk Phosphatase 39 - 117 U/L 88 96 86  Total Bilirubin 0.2 - 1.2 mg/dL 0.3 0.3 0.4    Lab Results  Component Value Date/Time   TSH 4.47 10/01/2020 09:53 AM    CBC Latest Ref Rng & Units 06/23/2021 10/01/2020 12/14/2018  WBC 4.0 - 10.5 K/uL 9.2 8.6 9.3  Hemoglobin 13.0 - 17.0 g/dL 12.1(L) 12.4(L) 12.8(L)  Hematocrit 39.0 - 52.0 % 36.5(L) 37.3(L) 37.6  Platelets 150.0 - 400.0 K/uL 345.0 384.0 325    No results found for: VD25OH  Clinical ASCVD: Yes  The ASCVD Risk score (Arnett DK, et al., 2019) failed to calculate for the following reasons:   The valid total cholesterol range is 130 to 320 mg/dL    Depression screen [facility] 2/9 06/21/2021 11/25/2020  Decreased Interest 0 0  Down, Depressed, Hopeless 0 0  PHQ - 2 Score 0 0      Social History   Tobacco Use  Smoking Status Former   Packs/day: 1.00   Years: 59.00   Pack years: 59.00   Types: Cigarettes   Quit date: 09/05/2016   Years since quitting: 4.9  Smokeless Tobacco Never   BP Readings from Last 3 Encounters:  07/19/21 112/64  06/29/21 132/74  06/21/21 130/66   Pulse Readings from Last 3 Encounters:  07/19/21 80  06/29/21 87  06/21/21 78   Wt Readings from Last 3 Encounters:  07/19/21 166 lb 12.8 oz (75.7 kg)  07/13/21 168 lb 3.4 oz (76.3 kg)  06/29/21 168 lb 3.2 oz (76.3 kg)   BMI  Readings from Last 3 Encounters:  07/19/21 26.52 kg/m  07/13/21 26.74 kg/m  06/29/21 26.74 kg/m    Assessment/Interventions: Review of patient past medical history, allergies, medications, health status, including review of consultants reports, laboratory and other test data, was performed as part of comprehensive evaluation and provision of chronic care management services.   SDOH:  (Social Determinants of Health) assessments and interventions performed: Yes  Financial Resource Strain: Low Risk    Difficulty of Paying Living Expenses: Not hard at all    SDOH Screenings   Alcohol Screen: Not on file  Depression (PHQ2-9): Low Risk    PHQ-2 Score: 0  Financial Resource Strain: Low Risk    Difficulty of Paying Living Expenses: Not hard at all  Food Insecurity: Not on file  Housing: Not on file  Physical Activity: Not on file  Social Connections: Not on file  Stress: Not on file  Tobacco Use: Medium Risk   Smoking Tobacco Use: Former   Smokeless Tobacco Use: Never   Passive Exposure: Not on file  Transportation Needs: Not on file    CCM Care Plan  Allergies  Allergen Reactions   Statins Other (See Comments)    Terrible joint pain   Tetanus Toxoids Other (See Comments)    Fever 104/105 & arm stiffiness    Medications Reviewed Today     Reviewed by Edythe Clarity, [facility] (Pharmacist) on 08/16/21 at 1018  Med List Status: <None>   Medication Order Taking? Sig Documenting Provider Last Dose Status Informant  aspirin 81 MG chewable tablet [id number] Yes Chew by mouth. [provider] Taking Active   B Complex Vitamins (VITAMIN B COMPLEX) TABS [id number] Yes Take by mouth. [provider] Taking Active   Cholecalciferol (VITAMIN D3 PO) [id number] Yes Take 1 capsule by mouth daily. [provider] Taking Active   colchicine 0.6 MG tablet [id number] Yes Take 1 tablet (0.6 mg total) by mouth daily. Orma Flaming, MD Taking Active   CONTOUR NEXT TEST  test strip [id number] Yes  [provider] Taking Active   empagliflozin (JARDIANCE) 10 MG TABS tablet [id number] No Take 1 tablet (10 mg total) by mouth daily before breakfast.  Patient not taking: Reported on 08/16/2021   Marin Olp, MD Not Taking Active   ENTRESTO 97-103 MG [id number] Yes TAKE 1 TABLET BY MOUTH TWICE DAILY Buford Dresser, MD Taking Active   Evolocumab [facility] SURECLICK) 702 MG/ML Darden Palmer [id number] Yes ADMINISTER 1 ML(140 MG) UNDER THE SKIN EVERY 14 DAYS Buford Dresser, MD Taking Active   furosemide (LASIX) 20 MG tablet [id number] Yes TAKE 1 TABLET BY MOUTH DAILY OR AS DIRECTED GENERIC EQUIVALENT FOR Rosita Kea, MD Taking Active   hydroxychloroquine (PLAQUENIL) 200 MG tablet [id number] Yes See admin instructions. [provider] Taking Active   Insulin Glargine (BASAGLAR KWIKPEN) 100 UNIT/ML [id number] Yes Inject 10-20 Units into the skin daily. Please provide pen needles Marin Olp, MD Taking Active   Insulin Pen Needle 31G X 8 MM MISC [id number] Yes Use to inject Basaglar daily. Dx:E11.9 Marin Olp, MD Taking Active   metFORMIN (GLUCOPHAGE-XR) 500 MG 24 hr tablet [id number] Yes Take 1,000 mg by mouth 2 (two) times daily. [provider] Taking Active   metoprolol succinate (TOPROL-XL) 25 MG 24 hr tablet [id number] Yes Take 1 tablet (25 mg total) by mouth daily. Buford Dresser, MD Taking Active   Multiple Vitamins-Minerals (PRESERVISION AREDS PO) [id number] Yes  [provider] Taking Active   nitroGLYCERIN (NITROSTAT) 0.4 MG SL tablet [id number]  Place 1 tablet (0.4 mg total) under the tongue every 5 (five) minutes as needed for chest pain. Buford Dresser, MD  Expired 11/11/20 2359   triamcinolone (KENALOG) 0.1 % [id number] Yes 1 application to affected area [provider] Taking Active   Turmeric Curcumin 500 MG CAPS [id number] Yes Take by mouth. [provider] Taking  Active             Patient Active Problem List   Diagnosis Date Noted   COVID-19 07/13/2021   Personal history of COVID-19 10/25/2020   Diabetes type 2, uncontrolled 10/01/2020   Pseudogout involving multiple joints 10/01/2020   Ischemic cardiomyopathy 07/25/2019   Chronic combined systolic and diastolic heart failure ([place]) 07/12/2018  Coronary artery disease due to lipid rich plaque 07/12/2018   Mixed hyperlipidemia 07/12/2018   Statin intolerance 07/12/2018   Pulmonary emphysema ([place]) 06/14/2018   Pulmonary HTN ([place]) 06/14/2018    Immunization History  Administered Date(s) Administered   Fluad Quad(high Dose 65+) 04/29/2019, 06/21/2021   Influenza, High Dose Seasonal PF 07/05/2017   PFIZER Comirnaty([place])Covid-19 Tri-Sucrose Vaccine 10/03/2019, 10/16/2019, 06/01/2020   Pneumococcal Conjugate-13 07/03/2014   Pneumococcal Polysaccharide-23 09/14/2008   Unspecified SARS-COV-2 Vaccination 06/10/2021   Zoster Recombinat (Shingrix) 02/19/2019, 06/04/2019    Conditions to be addressed/monitored:  Systolic/Diastolic HF, DM, HLD/CAD.  Care Plan : General Pharmacy (Adult)  Updates made by Edythe Clarity, RPH since 08/16/2021 12:00 AM     Problem: Systolic/Diastolic HF, DM, HLD/CAD.   Priority: High  Onset Date: 08/16/2021     Long-Range Goal: Patient-Specific Goal   Start Date: 08/16/2021  Expected End Date: 02/14/2022  This Visit's Progress: On track  Priority: High  Note:   Current Barriers:  Uncontrolled glucose Neuropathy  Pharmacist Clinical Goal(s):  Patient will achieve improvement in A1c and neuropathy symptoms as evidenced by A1c/monitoring through collaboration with PharmD and provider.   Interventions: 1:1 collaboration with Marin Olp, MD regarding development and update of comprehensive plan of care as evidenced by provider attestation and co-signature Inter-disciplinary care team collaboration (see longitudinal plan of  care) Comprehensive medication review performed; medication list updated in electronic medical record  Hyperlipidemia/CAD: (LDL goal < 70) -Controlled -Current treatment: Repatha 18m/ml every 14 days ASA 868m-Medications previously tried: statins (terrible joint pains)  -Current dietary patterns: he eats "a little bit of everything" per his report, son owns [employer] so he goes out to eat there at least once or twice per week. -Current exercise habits: tries to walk 10k steps per day, [place] [employer] and [place] with his wife and dog -Educated on Cholesterol goals;  Importance of limiting foods high in cholesterol; Exercise goal of 150 minutes per week; -Recommended to continue current medication Assessed patient finances. Gets Repatha through [employer] current medications he is happy with them and are working well.  Diabetes (A1c goal <7%) -Uncontrolled -Current medications: Metformin XR 50077mID Jardiance 79m63mily - he has stopped taking Basaglar 10 units daily -Medications previously tried: Jardiance (stiffness)  -Current home glucose readings fasting glucose: 120-243 - with the majority being around 150-160. post prandial glucose: not checking -Denies hypoglycemic/hyperglycemic symptoms -Current meal patterns:  He eats a lot of soups at home, does eat out at least once per week at his sons restaurant -Current exercise: walking - goal of 10k steps per day -Educated on A1c and blood sugar goals; Complications of diabetes including kidney damage, retinal damage, and cardiovascular disease; Exercise goal of 150 minutes per week; Prevention and management of hypoglycemic episodes; Benefits of routine self-monitoring of blood sugar; He does mention some neuropathy in his feet mainly at night - he was interested in trying medication to help this. -Counseled to check feet daily and get yearly eye exams -Counseled on diet and  exercise extensively Recommended he limit carbohydrates and late night sweets.  Suggested handful of nuts if he needed a night time snack.  Consider increasing insulin to 15 units daily.  Continue regular monitoring at home - has visit in January for A1c recheck.  Will also discuss initiation of gabapentin at night for neuropathy.  Heart Failure (Goal: manage symptoms and prevent exacerbations) -Controlled -Last ejection fraction: 50-55%  -HF type: Systolic -  NYHA Class: I (no actitivty limitation) -AHA HF Stage: B (Heart disease present - no symptoms present) -Current treatment: Toprolol XL 32m daily Furosemide 250mdaily Entresto 97-103 mg BID -Medications previously tried: none noted  -Current home BP/HR readings: "normal" he does not check but reports no symptoms -Current dietary habits: see above -Current exercise habits: see above -Educated on Benefits of medications for managing symptoms and prolonging life Importance of weighing daily; if you gain more than 3 pounds in one day or 5 pounds in one week, contact providers Importance of blood pressure control -Recommended to continue current medication He denies any swelling, minimal SOB.  EnDelene Lolls affordable, however discussed possible PAP if he enters donut hole next year/  Patient Goals/Self-Care Activities Patient will:  - take medications as prescribed as evidenced by patient report and record review check glucose daily, document, and provide at future appointments target a minimum of 150 minutes of moderate intensity exercise weekly engage in dietary modifications by limiting carbohydrates and excess sugars.  Follow Up Plan: The care management team will reach out to the patient again over the next 120 days.       Medication Assistance: None required.  Patient affirms current coverage meets needs.  Compliance/Adherence/Medication fill history: Care Gaps: None  Star-Rating Drugs: Metformin XR 5008m0/19/22  90ds  [facility]one: [phone]: [phone]tr[facility]one: [phone]: [phone] pill box? No - organizes in a drawer Pt endorses 100% compliance  We discussed: Current pharmacy is preferred with insurance plan and patient is satisfied with pharmacy services Patient decided to: Continue current medication management strategy  Care Plan and Follow Up Patient Decision:  Patient agrees to Care Plan and Follow-up.  Plan: The care management team will reach out to the patient again over the next 120 days.  ChrBeverly MilchharmD Clinical Pharmacist  [phone]

## 2021-08-16 ENCOUNTER — Ambulatory Visit (INDEPENDENT_AMBULATORY_CARE_PROVIDER_SITE_OTHER): Payer: Medicare Other

## 2021-08-16 ENCOUNTER — Other Ambulatory Visit: Payer: Self-pay

## 2021-08-16 ENCOUNTER — Ambulatory Visit: Payer: Medicare Other | Admitting: Pharmacist

## 2021-08-16 DIAGNOSIS — Z0000 Encounter for general adult medical examination without abnormal findings: Secondary | ICD-10-CM

## 2021-08-16 DIAGNOSIS — E1165 Type 2 diabetes mellitus with hyperglycemia: Secondary | ICD-10-CM

## 2021-08-16 DIAGNOSIS — E782 Mixed hyperlipidemia: Secondary | ICD-10-CM

## 2021-08-16 DIAGNOSIS — I5042 Chronic combined systolic (congestive) and diastolic (congestive) heart failure: Secondary | ICD-10-CM

## 2021-08-16 NOTE — Patient Instructions (Addendum)
Visit Information   Goals Addressed             This Visit's Progress    Monitor and Manage My Blood Sugar-Diabetes Type 2       Timeframe:  Long-Range Goal Priority:  High Start Date:  08/16/21                           Expected End Date:  02/14/22                     Follow Up Date 11/14/21    - check blood sugar at prescribed times - check blood sugar if I feel it is too high or too low - enter blood sugar readings and medication or insulin into daily log    Why is this important?   Checking your blood sugar at home helps to keep it from getting very high or very low.  Writing the results in a diary or log helps the doctor know how to care for you.  Your blood sugar log should have the time, date and the results.  Also, write down the amount of insulin or other medicine that you take.  Other information, like what you ate, exercise done and how you were feeling, will also be helpful.     Notes:        Patient Care Plan: General Pharmacy (Adult)     Problem Identified: Systolic/Diastolic HF, DM, HLD/CAD.   Priority: High  Onset Date: 08/16/2021     Long-Range Goal: Patient-Specific Goal   Start Date: 08/16/2021  Expected End Date: 02/14/2022  This Visit's Progress: On track  Priority: High  Note:   Current Barriers:  Uncontrolled glucose Neuropathy  Pharmacist Clinical Goal(s):  Patient will achieve improvement in A1c and neuropathy symptoms as evidenced by A1c/monitoring through collaboration with PharmD and provider.   Interventions: 1:1 collaboration with Marin Olp, MD regarding development and update of comprehensive plan of care as evidenced by provider attestation and co-signature Inter-disciplinary care team collaboration (see longitudinal plan of care) Comprehensive medication review performed; medication list updated in electronic medical record  Hyperlipidemia/CAD: (LDL goal < 70) -Controlled -Current treatment: Repatha 140mg /ml every  14 days ASA 81mg  -Medications previously tried: statins (terrible joint pains)  -Current dietary patterns: he eats "a little bit of everything" per his report, son owns [employer] so he goes out to eat there at least once or twice per week. -Current exercise habits: tries to walk 10k steps per day, will go over to [employer] and walk with his wife and dog -Educated on Cholesterol goals;  Importance of limiting foods high in cholesterol; Exercise goal of 150 minutes per week; -Recommended to continue current medication Assessed patient finances. Gets Repatha through [employer], continue current medications he is happy with them and are working well.  Diabetes (A1c goal <7%) -Uncontrolled -Current medications: Metformin XR 500mg  BID Jardiance 10mg  daily - he has stopped taking Basaglar 10 units daily -Medications previously tried: Jardiance (stiffness)  -Current home glucose readings fasting glucose: 120-243 - with the majority being around 150-160. post prandial glucose: not checking -Denies hypoglycemic/hyperglycemic symptoms -Current meal patterns:  He eats a lot of soups at home, does eat out at least once per week at his sons restaurant -Current exercise: walking - goal of 10k steps per day -Educated on A1c and blood sugar goals; Complications of diabetes including kidney damage, retinal damage, and cardiovascular disease; Exercise  goal of 150 minutes per week; Prevention and management of hypoglycemic episodes; Benefits of routine self-monitoring of blood sugar; He does mention some neuropathy in his feet mainly at night - he was interested in trying medication to help this. -Counseled to check feet daily and get yearly eye exams -Counseled on diet and exercise extensively Recommended he limit carbohydrates and late night sweets.  Suggested handful of nuts if he needed a night time snack.  Consider increasing insulin to 15 units daily.  Continue regular  monitoring at home - has visit in January for A1c recheck.  Will also discuss initiation of gabapentin at night for neuropathy.  Heart Failure (Goal: manage symptoms and prevent exacerbations) -Controlled -Last ejection fraction: 50-55%  -HF type: Systolic -NYHA Class: I (no actitivty limitation) -AHA HF Stage: B (Heart disease present - no symptoms present) -Current treatment: Toprolol XL 25mg  daily Furosemide 20mg  daily Entresto 97-103 mg BID -Medications previously tried: none noted  -Current home BP/HR readings: "normal" he does not check but reports no symptoms -Current dietary habits: see above -Current exercise habits: see above -Educated on Benefits of medications for managing symptoms and prolonging life Importance of weighing daily; if you gain more than 3 pounds in one day or 5 pounds in one week, contact providers Importance of blood pressure control -Recommended to continue current medication He denies any swelling, minimal SOB.  Delene Loll is affordable, however discussed possible PAP if he enters donut hole next year/  Patient Goals/Self-Care Activities Patient will:  - take medications as prescribed as evidenced by patient report and record review check glucose daily, document, and provide at future appointments target a minimum of 150 minutes of moderate intensity exercise weekly engage in dietary modifications by limiting carbohydrates and excess sugars.  Follow Up Plan: The care management team will reach out to the patient again over the next 120 days.      Mr. Mcandrew was given information about Chronic Care Management services today including:  CCM service includes personalized support from designated clinical staff supervised by his physician, including individualized plan of care and coordination with other care providers 24/7 contact phone numbers for assistance for urgent and routine care needs. Standard insurance, coinsurance, copays and deductibles apply  for chronic care management only during months in which we provide at least 20 minutes of these services. Most insurances cover these services at 100%, however patients may be responsible for any copay, coinsurance and/or deductible if applicable. This service may help you avoid the need for more expensive face-to-face services. Only one practitioner may furnish and bill the service in a calendar month. The patient may stop CCM services at any time (effective at the end of the month) by phone call to the office staff.  Patient agreed to services and verbal consent obtained.   The patient verbalized understanding of instructions, educational materials, and care plan provided today and agreed to receive a mailed copy of patient instructions, educational materials, and care plan.  Telephone follow up appointment with pharmacy team member scheduled for: 4 months  Edythe Clarity, [place], PharmD Clinical Pharmacist  [facility] [phone]

## 2021-08-16 NOTE — Patient Instructions (Signed)
Casey Lynch , Thank you for taking time to come for your Medicare Wellness Visit. I appreciate your ongoing commitment to your health goals. Please review the following plan we discussed and let me know if I can assist you in the future.   Screening recommendations/referrals: Colonoscopy: No longer required  Recommended yearly ophthalmology/optometry visit for glaucoma screening and checkup Recommended yearly dental visit for hygiene and checkup  Vaccinations: Influenza vaccine: Done 06/21/21 repeat every year  Pneumococcal vaccine: Up to date Shingles vaccine: Completed 6/16, & 06/04/19   Covid-19: Completed 1/28, 2/19, 06/01/20 & 06/10/21  Advanced directives: Copies in chart   Conditions/risks identified: Get diabetes under control   Next appointment: Follow up in one year for your annual wellness visit.   Preventive Care 73 Years and Older, Male Preventive care refers to lifestyle choices and visits with your health care provider that can promote health and wellness. What does preventive care include? A yearly physical exam. This is also called an annual well check. Dental exams once or twice a year. Routine eye exams. Ask your health care provider how often you should have your eyes checked. Personal lifestyle choices, including: Daily care of your teeth and gums. Regular physical activity. Eating a healthy diet. Avoiding tobacco and drug use. Limiting alcohol use. Practicing safe sex. Taking low doses of aspirin every day. Taking vitamin and mineral supplements as recommended by your health care provider. What happens during an annual well check? The services and screenings done by your health care provider during your annual well check will depend on your age, overall health, lifestyle risk factors, and family history of disease. Counseling  Your health care provider may ask you questions about your: Alcohol use. Tobacco use. Drug use. Emotional well-being. Home and  relationship well-being. Sexual activity. Eating habits. History of falls. Memory and ability to understand (cognition). Work and work Statistician. Screening  You may have the following tests or measurements: Height, weight, and BMI. Blood pressure. Lipid and cholesterol levels. These may be checked every 5 years, or more frequently if you are over 63 years old. Skin check. Lung cancer screening. You may have this screening every year starting at age 94 if you have a 30-pack-year history of smoking and currently smoke or have quit within the past 15 years. Fecal occult blood test (FOBT) of the stool. You may have this test every year starting at age 36. Flexible sigmoidoscopy or colonoscopy. You may have a sigmoidoscopy every 5 years or a colonoscopy every 10 years starting at age 59. Prostate cancer screening. Recommendations will vary depending on your family history and other risks. Hepatitis C blood test. Hepatitis B blood test. Sexually transmitted disease (STD) testing. Diabetes screening. This is done by checking your blood sugar (glucose) after you have not eaten for a while (fasting). You may have this done every 1-3 years. Abdominal aortic aneurysm (AAA) screening. You may need this if you are a current or former smoker. Osteoporosis. You may be screened starting at age 57 if you are at high risk. Talk with your health care provider about your test results, treatment options, and if necessary, the need for more tests. Vaccines  Your health care provider may recommend certain vaccines, such as: Influenza vaccine. This is recommended every year. Tetanus, diphtheria, and acellular pertussis (Tdap, Td) vaccine. You may need a Td booster every 10 years. Zoster vaccine. You may need this after age 57. Pneumococcal 13-valent conjugate (PCV13) vaccine. One dose is recommended after age 39. Pneumococcal  polysaccharide (PPSV23) vaccine. One dose is recommended after age 71. Talk to your  health care provider about which screenings and vaccines you need and how often you need them. This information is not intended to replace advice given to you by your health care provider. Make sure you discuss any questions you have with your health care provider. Document Released: 09/18/2015 Document Revised: 05/11/2016 Document Reviewed: 06/23/2015 Elsevier Interactive Patient Education  2017 [employer] Prevention in the Home Falls can cause injuries. They can happen to people of all ages. There are many things you can do to make your home safe and to help prevent falls. What can I do on the outside of my home? Regularly fix the edges of walkways and driveways and fix any cracks. Remove anything that might make you trip as you walk through a door, such as a raised step or threshold. Trim any bushes or trees on the path to your home. Use bright outdoor lighting. Clear any walking paths of anything that might make someone trip, such as rocks or tools. Regularly check to see if handrails are loose or broken. Make sure that both sides of any steps have handrails. Any raised decks and porches should have guardrails on the edges. Have any leaves, snow, or ice cleared regularly. Use sand or salt on walking paths during winter. Clean up any spills in your garage right away. This includes oil or grease spills. What can I do in the bathroom? Use night lights. Install grab bars by the toilet and in the tub and shower. Do not use towel bars as grab bars. Use non-skid mats or decals in the tub or shower. If you need to sit down in the shower, use a plastic, non-slip stool. Keep the floor dry. Clean up any water that spills on the floor as soon as it happens. Remove soap buildup in the tub or shower regularly. Attach bath mats securely with double-sided non-slip rug tape. Do not have throw rugs and other things on the floor that can make you trip. What can I do in the bedroom? Use night  lights. Make sure that you have a light by your bed that is easy to reach. Do not use any sheets or blankets that are too big for your bed. They should not hang down onto the floor. Have a firm chair that has side arms. You can use this for support while you get dressed. Do not have throw rugs and other things on the floor that can make you trip. What can I do in the kitchen? Clean up any spills right away. Avoid walking on wet floors. Keep items that you use a lot in easy-to-reach places. If you need to reach something above you, use a strong step stool that has a grab bar. Keep electrical cords out of the way. Do not use floor polish or wax that makes floors slippery. If you must use wax, use non-skid floor wax. Do not have throw rugs and other things on the floor that can make you trip. What can I do with my stairs? Do not leave any items on the stairs. Make sure that there are handrails on both sides of the stairs and use them. Fix handrails that are broken or loose. Make sure that handrails are as long as the stairways. Check any carpeting to make sure that it is firmly attached to the stairs. Fix any carpet that is loose or worn. Avoid having throw rugs at the top  or bottom of the stairs. If you do have throw rugs, attach them to the floor with carpet tape. Make sure that you have a light switch at the top of the stairs and the bottom of the stairs. If you do not have them, ask someone to add them for you. What else can I do to help prevent falls? Wear shoes that: Do not have high heels. Have rubber bottoms. Are comfortable and fit you well. Are closed at the toe. Do not wear sandals. If you use a stepladder: Make sure that it is fully opened. Do not climb a closed stepladder. Make sure that both sides of the stepladder are locked into place. Ask someone to hold it for you, if possible. Clearly mark and make sure that you can see: Any grab bars or handrails. First and last  steps. Where the edge of each step is. Use tools that help you move around (mobility aids) if they are needed. These include: Canes. Walkers. Scooters. Crutches. Turn on the lights when you go into a dark area. Replace any light bulbs as soon as they burn out. Set up your furniture so you have a clear path. Avoid moving your furniture around. If any of your floors are uneven, fix them. If there are any pets around you, be aware of where they are. Review your medicines with your doctor. Some medicines can make you feel dizzy. This can increase your chance of falling. Ask your doctor what other things that you can do to help prevent falls. This information is not intended to replace advice given to you by your health care provider. Make sure you discuss any questions you have with your health care provider. Document Released: 06/18/2009 Document Revised: 01/28/2016 Document Reviewed: 09/26/2014 Elsevier Interactive Patient Education  2017 [employer].

## 2021-08-16 NOTE — Progress Notes (Signed)
Virtual Visit via Telephone Note  I connected with  Casey Lynch on 08/16/21 at 11:45 AM EST by telephone and verified that I am speaking with the correct person using two identifiers.  Medicare Annual Wellness visit completed telephonically due to Covid-19 pandemic.   Persons participating in this call: This Health Coach and this patient.   Location: Patient: Home  Provider: Office    I discussed the limitations, risks, security and privacy concerns of performing an evaluation and management service by telephone and the availability of in person appointments. The patient expressed understanding and agreed to proceed.  Unable to perform video visit due to video visit attempted and failed and/or patient does not have video capability.   Some vital signs may be absent or patient reported.   Willette Brace, LPN   Subjective:   Casey Lynch is a 75 y.o. male who presents for an Initial Medicare Annual Wellness Visit.  Review of Systems     Cardiac Risk Factors include: advanced age (>11men, >57 women);hypertension;dyslipidemia;male gender;diabetes mellitus     Objective:    There were no vitals filed for this visit. There is no height or weight on file to calculate BMI.  Advanced Directives 08/16/2021 10/24/2016  Does Patient Have a Medical Advance Directive? Yes Yes  Type of Paramedic of [place];Living will  Copy of [place] in Chart? Yes - validated most recent copy scanned in chart (See row information) No - copy requested    Current Medications (verified) Outpatient Encounter Medications as of 08/16/2021  Medication Sig   aspirin 81 MG chewable tablet Chew by mouth.   B Complex Vitamins (VITAMIN B COMPLEX) TABS Take by mouth.   Cholecalciferol (VITAMIN D3 PO) Take 1 capsule by mouth daily.   colchicine 0.6 MG tablet Take 1 tablet (0.6 mg total) by mouth daily.   CONTOUR NEXT TEST test strip     ENTRESTO 97-103 MG TAKE 1 TABLET BY MOUTH TWICE DAILY   Evolocumab (REPATHA SURECLICK) 161 MG/ML SOAJ ADMINISTER 1 ML(140 MG) UNDER THE SKIN EVERY 14 DAYS   furosemide (LASIX) 20 MG tablet TAKE 1 TABLET BY MOUTH DAILY OR AS DIRECTED GENERIC EQUIVALENT FOR LASIX   hydroxychloroquine (PLAQUENIL) 200 MG tablet See admin instructions.   Insulin Glargine (BASAGLAR KWIKPEN) 100 UNIT/ML Inject 10-20 Units into the skin daily. Please provide pen needles   Insulin Pen Needle 31G X 8 MM MISC Use to inject Basaglar daily. Dx:E11.9   metFORMIN (GLUCOPHAGE-XR) 500 MG 24 hr tablet Take 1,000 mg by mouth 2 (two) times daily.   metoprolol succinate (TOPROL-XL) 25 MG 24 hr tablet Take 1 tablet (25 mg total) by mouth daily.   Multiple Vitamins-Minerals (PRESERVISION AREDS PO)    triamcinolone (KENALOG) 0.1 % 1 application to affected area   nitroGLYCERIN (NITROSTAT) 0.4 MG SL tablet Place 1 tablet (0.4 mg total) under the tongue every 5 (five) minutes as needed for chest pain.   Turmeric Curcumin 500 MG CAPS Take by mouth. (Patient not taking: Reported on 08/16/2021)   [DISCONTINUED] empagliflozin (JARDIANCE) 10 MG TABS tablet Take 1 tablet (10 mg total) by mouth daily before breakfast. (Patient not taking: Reported on 08/16/2021)   No facility-administered encounter medications on file as of 08/16/2021.    Allergies (verified) Jardiance [empagliflozin], Statins, and Tetanus toxoids   History: Past Medical History:  Diagnosis Date   Carotid bruit    Diabetes mellitus without complication (HCC)    Eczema  Heart failure ([place])    History of colonic polyps    Hypercholesterolemia    Hyperlipidemia    Macular degeneration    Osteoarthritis    Paresthesias    Pseudogout    Subclavian steal syndrome    Past Surgical History:  Procedure Laterality Date   CARDIAC CATHETERIZATION     COLONOSCOPY WITH PROPOFOL N/A 10/24/2016   Procedure: COLONOSCOPY WITH PROPOFOL;  Surgeon: Garlan Fair, MD;   Location: [facility];  Service: Endoscopy;  Laterality: N/A;   HYDROCELE EXCISION / REPAIR     KNEE SURGERY     REFRACTIVE SURGERY     TONSILLECTOMY     Family History  Problem Relation Age of Onset   Heart failure Father    Bipolar disorder Brother    Diabetes Brother    Cancer Paternal Grandmother    Social History   Socioeconomic History   Marital status: Married    Spouse name: Not on file   Number of children: Not on file   Years of education: Not on file   Highest education level: Not on file  Occupational History   Not on file  Tobacco Use   Smoking status: Former    Packs/day: 1.00    Years: 59.00    Pack years: 59.00    Types: Cigarettes    Quit date: 09/05/2016    Years since quitting: 4.9   Smokeless tobacco: Never  Vaping Use   Vaping Use: Never used  Substance and Sexual Activity   Alcohol use: Yes    Alcohol/week: 0.0 standard drinks    Comment: Rare   Drug use: No   Sexual activity: Yes  Other Topics Concern   Not on file  Social History Narrative   Married. 2 children. 3 grandkids.       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    - congregational [employer]      Hobbies: golf, woodworking, Geographical information systems officer for fly fishing   Social Determinants of Health   Financial Resource Strain: Low Risk    Difficulty of Paying Living Expenses: Not hard at all  Food Insecurity: No Food Insecurity   Worried About Charity fundraiser in the Last Year: Never true   Arboriculturist in the Last Year: Never true  Transportation Needs: No Transportation Needs   Lack of Transportation (Medical): No   Lack of Transportation (Non-Medical): No  Physical Activity: Sufficiently Active   Days of Exercise per Week: 4 days   Minutes of Exercise per Session: 60 min  Stress: No Stress Concern Present   Feeling of Stress : Not at all  Social Connections: Socially Integrated   Frequency of Communication with Friends and Family: More than three  times a week   Frequency of Social Gatherings with Friends and Family: More than three times a week   Attends Religious Services: More than 4 times per year   Active Member of [employer] or Organizations: Yes   Attends Archivist Meetings: 1 to 4 times per year   Marital Status: Married    Tobacco Counseling Counseling given: Not Answered   Clinical Intake:  Pre-visit preparation completed: Yes  Pain : No/denies pain     BMI - recorded: 26.52 Nutritional Status: BMI 25 -29 Overweight Nutritional Risks: None Diabetes: Yes CBG done?: Yes (150) CBG resulted in Enter/ Edit results?: No Did pt. bring in CBG monitor from home?: No  How often  do you need to have someone help you when you read instructions, pamphlets, or other written materials from your doctor or pharmacy?: 1 - Never  Diabetic?Nutrition Risk Assessment:  Has the patient had any N/V/D within the last 2 months?  No  Does the patient have any non-healing wounds?  No  Has the patient had any unintentional weight loss or weight gain?  No   Diabetes:  Is the patient diabetic?  Yes  If diabetic, was a CBG obtained today?  Yes  Did the patient bring in their glucometer from home?  No  How often do you monitor your CBG's? Daily.   Financial Strains and Diabetes Management:  Are you having any financial strains with the device, your supplies or your medication? No .  Does the patient want to be seen by Chronic Care Management for management of their diabetes?  No  Would the patient like to be referred to a Nutritionist or for Diabetic Management?  No   Diabetic Exams:  Diabetic Eye Exam: Completed 05/27/21 Diabetic Foot Exam: Completed 06/21/21   Interpreter Needed?: No  Information entered by :: Charlott Rakes, LPN   Activities of Daily Living In your present state of health, do you have any difficulty performing the following activities: 08/16/2021  Hearing? N  Vision? N  Difficulty concentrating  or making decisions? N  Walking or climbing stairs? Y  Comment SOB at times  Dressing or bathing? N  Doing errands, shopping? N  Preparing Food and eating ? N  Using the Toilet? N  In the past six months, have you accidently leaked urine? N  Do you have problems with loss of bowel control? N  Managing your Medications? N  Managing your Finances? N  Housekeeping or managing your Housekeeping? N  Some recent data might be hidden    Patient Care Team: Marin Olp, MD as PCP - General (Family Medicine) Buford Dresser, MD as PCP - Cardiology (Cardiology) Gavin Pound, MD as Consulting Physician (Rheumatology) Marshell Garfinkel, MD as Consulting Physician (Pulmonary Disease) Danella Sensing, MD as Consulting Physician (Dermatology) Edythe Clarity, [facility] as Pharmacist (Pharmacist)  Indicate any recent Medical Services you may have received from other than [facility] providers in the past year (date may be approximate).     Assessment:   This is a routine wellness examination for [place].  Hearing/Vision screen Hearing Screening - Comments:: Pt denies any hearing issues  Vision Screening - Comments:: [facility]   Dietary issues and exercise activities discussed: Current Exercise Habits: Home exercise routine, Type of exercise: walking, Time (Minutes): 60, Frequency (Times/Week): 4, Weekly Exercise (Minutes/Week): 240   Goals Addressed             This Visit's Progress    Patient Stated       Get Diabetes under control       Depression Screen PHQ 2/9 Scores 08/16/2021 06/21/2021 11/25/2020  PHQ - 2 Score 0 0 0    Fall Risk Fall Risk  08/16/2021 07/13/2021 06/21/2021 11/25/2020 10/01/2020  Falls in the past year? 1 0 1 0 1  Number falls in past yr: 1 - 0 - 0  Injury with Fall? 1 - 0 - 0  Comment bruised - - - -  Risk for fall due to : Impaired vision - No Fall Risks - No Fall Risks  Follow up Falls prevention discussed - Falls  evaluation completed - -    FALL RISK PREVENTION PERTAINING TO  THE HOME:  Any stairs in or around the home? Yes  If so, are there any without handrails? No  Home free of loose throw rugs in walkways, pet beds, electrical cords, etc? Yes  Adequate lighting in your home to reduce risk of falls? Yes   ASSISTIVE DEVICES UTILIZED TO PREVENT FALLS:  Life alert? No  Use of a cane, walker or w/c? No  Grab bars in the bathroom? Yes  Shower chair or bench in shower? No  Elevated toilet seat or a handicapped toilet? No   TIMED UP AND GO:  Was the test performed? No .   Cognitive Function:     6CIT Screen 08/16/2021  What Year? 0 points  What month? 0 points  What time? 0 points  Count back from 20 0 points  Months in reverse 0 points  Repeat phrase 0 points  Total Score 0    Immunizations Immunization History  Administered Date(s) Administered   Fluad Quad(high Dose 65+) 04/29/2019, 06/21/2021   Influenza, High Dose Seasonal PF 07/05/2017   PFIZER Comirnaty(Gray Top)Covid-19 Tri-Sucrose Vaccine 10/03/2019, 10/25/2019, 06/01/2020, 06/10/2021   Pneumococcal Conjugate-13 07/03/2014   Pneumococcal Polysaccharide-23 09/14/2008   Unspecified SARS-COV-2 Vaccination 06/10/2021   Zoster Recombinat (Shingrix) 02/19/2019, 06/04/2019      Flu Vaccine status: Up to date  Pneumococcal vaccine status: Up to date  Covid-19 vaccine status: Completed vaccines  Qualifies for Shingles Vaccine? Yes   Zostavax completed Yes   Shingrix Completed?: Yes  Screening Tests Health Maintenance  Topic Date Due   COVID-19 Vaccine (5 - Booster for Pfizer series) 08/05/2021   HEMOGLOBIN A1C  12/20/2021   OPHTHALMOLOGY EXAM  05/27/2022   FOOT EXAM  06/21/2022   INFLUENZA VACCINE  Completed   Hepatitis C Screening  Completed   Zoster Vaccines- Shingrix  Completed   HPV VACCINES  Aged Out   Pneumonia Vaccine 67+ Years old  Discontinued   COLONOSCOPY (Pts 45-61yrs Insurance coverage will need  to be confirmed)  Discontinued   TETANUS/TDAP  Discontinued    Health Maintenance  Health Maintenance Due  Topic Date Due   COVID-19 Vaccine (5 - Booster for [place] series) 08/05/2021    Colorectal cancer screening: No longer required.    Additional Screening:  Hepatitis C Screening: Completed 10/01/20  Vision Screening: Recommended annual ophthalmology exams for early detection of glaucoma and other disorders of the eye. Is the patient up to date with their annual eye exam?  Yes  Who is the provider or what is the name of the office in which the patient attends annual eye exams? [facility]  If pt is not established with a provider, would they like to be referred to a provider to establish care? No .   Dental Screening: Recommended annual dental exams for proper oral hygiene  Community Resource Referral / Chronic Care Management: CRR required this visit?  No   CCM required this visit?  No      Plan:     I have personally reviewed and noted the following in the patient's chart:   Medical and social history Use of alcohol, tobacco or illicit drugs  Current medications and supplements including opioid prescriptions. Patient is not currently taking opioid prescriptions. Functional ability and status Nutritional status Physical activity Advanced directives List of other physicians Hospitalizations, surgeries, and ER visits in previous 12 months Vitals Screenings to include cognitive, depression, and falls Referrals and appointments  In addition, I have reviewed and discussed with patient certain preventive protocols,  quality metrics, and best practice recommendations. A written personalized care plan for preventive services as well as general preventive health recommendations were provided to patient.     Willette Brace, LPN   00/51/1021   Nurse Notes: none

## 2021-08-26 ENCOUNTER — Telehealth: Payer: Self-pay | Admitting: *Deleted

## 2021-08-26 NOTE — Telephone Encounter (Signed)
Left detailed message on personal voicemail, Dr. Yong Channel said Numbers overall seem improved-lets have him update me in another 2 weeks. Please keep track of sugars and give [place] an update in 2 weeks. Any questions please call office.

## 2021-08-26 NOTE — Telephone Encounter (Signed)
Numbers overall seem improved-lets have him update me in another 2 weeks-I was considering increasing until I saw the 125-would like to see if he continues to trend down

## 2021-08-26 NOTE — Telephone Encounter (Signed)
Spoke to pt told him since you saw the pharmacist Christian Dr. Yong Channel would like you to increase Basaglar to 12 units per day. Pt said he increased to 15 units since he saw Christian. Told him okay what has your sugars been running? Pt said since increase on 12/13: 187, 145, 168, 163, 139, 159, 171, 156, 151, 125 today. Told him okay I will let Dr. Yong Channel no if any changes I will call you back. Pt verbalized understanding.

## 2021-08-26 NOTE — Telephone Encounter (Signed)
Dr. Yong Channel, please see message about pt's sugars. He increased Basaglar to 15 units on 12/13.

## 2021-09-20 ENCOUNTER — Telehealth: Payer: Self-pay | Admitting: Pharmacist

## 2021-09-20 NOTE — Chronic Care Management (AMB) (Signed)
Chronic Care Management Pharmacy Assistant   Name: Casey Lynch  MRN: [id number] DOB: [birth year]   Reason for Encounter: Diabetes Adherence Call    Recent office visits:  None  Recent consult visits:  None  Hospital visits:  None in previous 6 months  Medications: Outpatient Encounter Medications as of 09/20/2021  Medication Sig   aspirin 81 MG chewable tablet Chew by mouth.   B Complex Vitamins (VITAMIN B COMPLEX) TABS Take by mouth.   Cholecalciferol (VITAMIN D3 PO) Take 1 capsule by mouth daily.   colchicine 0.6 MG tablet Take 1 tablet (0.6 mg total) by mouth daily.   CONTOUR NEXT TEST test strip    ENTRESTO 97-103 MG TAKE 1 TABLET BY MOUTH TWICE DAILY   Evolocumab (REPATHA SURECLICK) 950 MG/ML SOAJ ADMINISTER 1 ML(140 MG) UNDER THE SKIN EVERY 14 DAYS   furosemide (LASIX) 20 MG tablet TAKE 1 TABLET BY MOUTH DAILY OR AS DIRECTED GENERIC EQUIVALENT FOR LASIX   hydroxychloroquine (PLAQUENIL) 200 MG tablet See admin instructions.   Insulin Glargine (BASAGLAR KWIKPEN) 100 UNIT/ML Inject 10-20 Units into the skin daily. Please provide pen needles   Insulin Pen Needle 31G X 8 MM MISC Use to inject Basaglar daily. Dx:E11.9   metFORMIN (GLUCOPHAGE-XR) 500 MG 24 hr tablet Take 1,000 mg by mouth 2 (two) times daily.   metoprolol succinate (TOPROL-XL) 25 MG 24 hr tablet Take 1 tablet (25 mg total) by mouth daily.   Multiple Vitamins-Minerals (PRESERVISION AREDS PO)    nitroGLYCERIN (NITROSTAT) 0.4 MG SL tablet Place 1 tablet (0.4 mg total) under the tongue every 5 (five) minutes as needed for chest pain.   triamcinolone (KENALOG) 0.1 % 1 application to affected area   Turmeric Curcumin 500 MG CAPS Take by mouth. (Patient not taking: Reported on 08/16/2021)   No facility-administered encounter medications on file as of 09/20/2021.   Recent Relevant Labs: Lab Results  Component Value Date/Time   HGBA1C 10.6 (A) 06/21/2021 03:27 PM   HGBA1C 9.0 (A) 11/25/2020 11:14 AM   HGBA1C  8.6 (H) 10/01/2020 09:53 AM   HGBA1C 7.2 (H) 12/24/2019 04:05 PM   MICROALBUR <0.7 10/01/2020 09:53 AM    Kidney Function Lab Results  Component Value Date/Time   CREATININE 1.29 07/19/2021 10:39 AM   CREATININE 1.17 06/23/2021 08:06 AM   GFR 53.96 (L) 07/19/2021 10:39 AM   GFRNONAA 58 (L) 12/24/2019 04:05 PM   GFRAA 67 12/24/2019 04:05 PM    Current antihyperglycemic regimen:  Basaglar Kiwkpen Metformin 500 mg twice daily  What recent interventions/DTPs have been made to improve glycemic control:  No recent interventions or DTPs.  Have there been any recent hospitalizations or ED visits since last visit with CPP? No  Patient denies hypoglycemic symptoms.  Patient denies hyperglycemic symptoms.  How often are you checking your blood sugar? once daily  What are your blood sugars ranging?  Fasting: 109, 110, 154, 114, 149, 112, 122, 113, 160, 114, 188, 161  During the week, how often does your blood glucose drop below 70? Never  Are you checking your feet daily/regularly? Yes, patient states he checks his feet regularly.  Adherence Review: Is the patient currently on a STATIN medication? No Is the patient currently on ACE/ARB medication? No Does the patient have >5 day gap between last estimated fill dates? No  Care Gaps: Medicare Annual Wellness: Completed 08/16/2021 Ophthalmology Exam: Next due on 05/27/2022 Foot Exam: Next due on 06/21/2022 Hemoglobin A1C: 10.6% on 06/21/2022 Colonoscopy: Last completed 10/24/2016  Future Appointments  Date Time Provider [place]  09/27/2021 10:20 AM Buford Dresser, MD [facility]  10/14/2021  9:20 AM Marin Olp, MD [facility]  12/21/2021  2:00 PM [facility] CCM PHARMACIST [facility]  08/25/2022 11:45 AM [facility] HEALTH COACH [facility]   Star Rating Drugs: Jardiance 10 mg last filled 06/21/2021 90 DS Entresto 97-103 mg last filled 07/06/2021 90 DS Metformin 500 mg last filled 06/23/2021 90 DS Basaglar  100 u last filled 07/20/2021 68 DS  April D Calhoun, [place] Pharmacist Assistant [phone]

## 2021-09-27 ENCOUNTER — Encounter (HOSPITAL_BASED_OUTPATIENT_CLINIC_OR_DEPARTMENT_OTHER): Payer: Self-pay | Admitting: Cardiology

## 2021-09-27 ENCOUNTER — Ambulatory Visit (HOSPITAL_BASED_OUTPATIENT_CLINIC_OR_DEPARTMENT_OTHER): Payer: Medicare Other | Admitting: Cardiology

## 2021-09-27 ENCOUNTER — Other Ambulatory Visit: Payer: Self-pay

## 2021-09-27 VITALS — BP 132/68 | HR 91 | Ht 66.5 in | Wt 172.2 lb

## 2021-09-27 DIAGNOSIS — E782 Mixed hyperlipidemia: Secondary | ICD-10-CM

## 2021-09-27 DIAGNOSIS — E1159 Type 2 diabetes mellitus with other circulatory complications: Secondary | ICD-10-CM

## 2021-09-27 DIAGNOSIS — I25118 Atherosclerotic heart disease of native coronary artery with other forms of angina pectoris: Secondary | ICD-10-CM

## 2021-09-27 DIAGNOSIS — I255 Ischemic cardiomyopathy: Secondary | ICD-10-CM | POA: Diagnosis not present

## 2021-09-27 DIAGNOSIS — Z789 Other specified health status: Secondary | ICD-10-CM

## 2021-09-27 DIAGNOSIS — I5042 Chronic combined systolic (congestive) and diastolic (congestive) heart failure: Secondary | ICD-10-CM | POA: Diagnosis not present

## 2021-09-27 MED ORDER — NITROGLYCERIN 0.4 MG SL SUBL: 0.4000 mg | SUBLINGUAL_TABLET | SUBLINGUAL | 3 refills | Status: DC | PRN

## 2021-09-27 MED ORDER — FUROSEMIDE 20 MG PO TABS: 20.0000 mg | ORAL_TABLET | Freq: Every day | ORAL | 3 refills | Status: DC | PRN

## 2021-09-27 NOTE — Patient Instructions (Signed)
Medication Instructions:  Try metoprolol succinate 50 mg daily for a week to see if this helps the angina (chest heaviness/shortness of breath with inclines and heavy exertion). If it helps, then let me know and we will change the prescription.   *If you need a refill on your cardiac medications before your next appointment, please call your pharmacy*   Lab Work: None ordered today   Testing/Procedures: None ordered today   Follow-Up: At [facility], you and your health needs are our priority.  As part of our continuing mission to provide you with exceptional heart care, we have created designated Provider Care Teams.  These Care Teams include your primary Cardiologist (physician) and Advanced Practice Providers (APPs -  Physician Assistants and Nurse Practitioners) who all work together to provide you with the care you need, when you need it.  We recommend signing up for the patient portal called "MyChart".  Sign up information is provided on this After Visit Summary.  MyChart is used to connect with patients for Virtual Visits (Telemedicine).  Patients are able to view lab/test results, encounter notes, upcoming appointments, etc.  Non-urgent messages can be sent to your provider as well.   To learn more about what you can do with MyChart, go to [email or web address].    Your next appointment:   3 month(s)  The format for your next appointment:   In Person  Provider:   Buford Dresser, MD

## 2021-09-27 NOTE — Progress Notes (Signed)
Cardiology Office Note:    Date:  09/27/2021   ID:  Casey Lynch, DOB 14-Aug-1946, MRN [id number]  PCP:  Marin Olp, MD  Cardiologist:  Buford Dresser, MD  Referring MD: Marin Olp, MD   CC: follow up  History of Present Illness:    Casey Lynch is a 76 y.o. male with a hx of with CAD, chronic systolic and diastolic heart failure, ischemic cardiomyopathy, hyperlipidemia, PAD (carotid bruit <50% bilaterally, left subclavian steal syndrome), COPD, former tobacco abuse, OSA on CPAP, type II diabetes. I met him 07/11/2018, and his initial cardiac evaluation from [place] is summarized in that note.  Today: Stopped Jardiance, has severe muscle stiffness. Has noticed more swelling in his hands, has restarted lasix daily to try to manage this. Doesn't notice LE edema. Has neuropathy in his feet. We discussed lasix, balance of hydration, kidney function, pseudogout, etc.   Continues to play golf. No chest pain or shortness of breath with golf. When he walks his dog, there are inclines. At those times has mild chest pain/shortness of breath, improves with rest. We discussed this is likely angina, denies any unstable symptoms. Has not had any issues at night waking up and needing nitroglycerin. Has improved with changing his diet.   Past Medical History:  Diagnosis Date   Carotid bruit    Diabetes mellitus without complication (HCC)    Eczema    Heart failure ([place])    History of colonic polyps    Hypercholesterolemia    Hyperlipidemia    Macular degeneration    Osteoarthritis    Paresthesias    Pseudogout    Subclavian steal syndrome     Past Surgical History:  Procedure Laterality Date   CARDIAC CATHETERIZATION     COLONOSCOPY WITH PROPOFOL N/A 10/24/2016   Procedure: COLONOSCOPY WITH PROPOFOL;  Surgeon: Garlan Fair, MD;  Location: [facility] ENDOSCOPY;  Service: Endoscopy;  Laterality: N/A;   HYDROCELE EXCISION / REPAIR     KNEE SURGERY     REFRACTIVE SURGERY      TONSILLECTOMY      Current Medications: Current Outpatient Medications on File Prior to Visit  Medication Sig   aspirin EC 81 MG tablet Take 81 mg by mouth daily. Swallow whole.   B Complex Vitamins (VITAMIN B COMPLEX) TABS Take by mouth.   Cholecalciferol (VITAMIN D3 PO) Take 1 capsule by mouth daily.   colchicine 0.6 MG tablet Take 1 tablet (0.6 mg total) by mouth daily.   CONTOUR NEXT TEST test strip    ENTRESTO 97-103 MG TAKE 1 TABLET BY MOUTH TWICE DAILY   Evolocumab (REPATHA SURECLICK) 159 MG/ML SOAJ ADMINISTER 1 ML(140 MG) UNDER THE SKIN EVERY 14 DAYS   hydroxychloroquine (PLAQUENIL) 200 MG tablet See admin instructions.   Insulin Glargine (BASAGLAR KWIKPEN) 100 UNIT/ML Inject 10-20 Units into the skin daily. Please provide pen needles   Insulin Pen Needle 31G X 8 MM MISC Use to inject Basaglar daily. Dx:E11.9   metFORMIN (GLUCOPHAGE-XR) 500 MG 24 hr tablet Take 1,000 mg by mouth 2 (two) times daily.   metoprolol succinate (TOPROL-XL) 25 MG 24 hr tablet Take 1 tablet (25 mg total) by mouth daily.   Multiple Vitamins-Minerals (PRESERVISION AREDS PO)    triamcinolone (KENALOG) 0.1 % 1 application to affected area   No current facility-administered medications on file prior to visit.     Allergies:   Jardiance [empagliflozin], Statins, and Tetanus toxoids   Social History   Tobacco Use  Smoking status: Former    Packs/day: 1.00    Years: 59.00    Pack years: 59.00    Types: Cigarettes    Quit date: 09/05/2016    Years since quitting: 5.0   Smokeless tobacco: Never  Vaping Use   Vaping Use: Never used  Substance Use Topics   Alcohol use: Yes    Alcohol/week: 0.0 standard drinks    Comment: Rare   Drug use: No    Family History: family history includes Bipolar disorder in his brother; Cancer in his paternal grandmother; Diabetes in his brother; Heart failure in his father.  ROS:   Please see the history of present illness.   Additional pertinent ROS otherwise  unremarkable.  EKGs/Labs/Other Studies Reviewed:    The following studies were reviewed today:  CT Chest 06/04/2021: COMPARISON:  04/19/2021   FINDINGS: Cardiovascular: The heart size is normal. No substantial pericardial effusion. Coronary artery calcification is evident. Mild atherosclerotic calcification is noted in the wall of the thoracic aorta.   Mediastinum/Nodes: No mediastinal lymphadenopathy. No evidence for gross hilar lymphadenopathy although assessment is limited by the lack of intravenous contrast on the current study. The esophagus has normal imaging features. There is no axillary lymphadenopathy.   Lungs/Pleura: Centrilobular emphsyema noted. There is some persistent subtle ground-glass opacity in the peripheral right upper lobe posteriorly with associated nodular components of more consolidative involvement. 1 of the dominant nodular component seen in the right upper lobe on image 107/3 previously (measuring about 9 mm when remeasured) has resolved in the interval with only some trace ground-glass opacity at this location today. Similar improvement in other areas of nodular involvement.   No new suspicious pulmonary nodule or mass. No focal airspace consolidation. No pleural effusion.   Upper Abdomen: Tiny hypodensity lateral segment left liver is stable, too small to characterize but likely benign. Possible tiny hypoattenuating lesion in the tail of pancreas with punctate wall calcification incompletely visualized on image 387/series 4.   Musculoskeletal: No worrisome lytic or sclerotic osseous abnormality.   IMPRESSION: 1. Overall improvement in the relatively large region of ground-glass opacity in the peripheral right upper lobe. The associated nodular consolidative components are improved in the interval. 2. Possible tiny hypoattenuating lesion in the tail of pancreas with punctate wall calcification. This region was not included on any of the prior  chest CTs. Consider abdominal MRI with and without contrast for definitive characterization. 3. Aortic Atherosclerosis (ICD10-I70.0) and Emphysema (ICD10-J43.9).  Echo 08/18/20 1. Left ventricular ejection fraction, by estimation, is 50 to 55%. The  left ventricle has low normal function. The left ventricle has no regional  wall motion abnormalities. Left ventricular diastolic parameters are  consistent with Grade I diastolic  dysfunction (impaired relaxation).   2. Right ventricular systolic function is normal. The right ventricular  size is mildly enlarged.   3. The mitral valve is normal in structure. Trivial mitral valve  regurgitation. No evidence of mitral stenosis.   4. The aortic valve is tricuspid. Aortic valve regurgitation is not  visualized. No aortic stenosis is present.   5. The inferior vena cava is normal in size with greater than 50%  respiratory variability, suggesting right atrial pressure of 3 mmHg.  Stress test 08/14/20 Nuclear stress EF: 51%. The left ventricular ejection fraction is mildly decreased (45-54%). There was no ST segment deviation noted during stress. Findings consistent with prior myocardial infarction. This is a low risk study.   Small inferior basal wall infarct no ischemia EF 51%  Echo 02/19/19  1. The left ventricle has mild-moderately reduced systolic function, with an ejection fraction of 40-45%. The cavity size was normal. Left ventricular diastolic Doppler parameters are consistent with impaired relaxation. Left ventricular diffuse  hypokinesis, with slightly worse function in the lateral wall.  2. The right ventricle has normal systolic function. The cavity was normal. There is no increase in right ventricular wall thickness. Right ventricular systolic pressure could not be assessed.  3. The aortic valve is tricuspid. Aortic valve regurgitation is trivial by color flow Doppler. No stenosis of the aortic valve.  4. Cannot exclude small PFO  by color flow Doppler.  5. There is redundancy of the interatrial septum.  EKG:  EKG is personally reviewed.   06/29/2021: NSR, RBBB at 87 bpm 07/02/20: NSR, RBBB at 77 bpm  Recent Labs: 10/01/2020: TSH 4.47 06/23/2021: Hemoglobin 12.1; Platelets 345.0; Pro B Natriuretic peptide (BNP) 41.0 07/19/2021: ALT 16; BUN 30; Creatinine, Ser 1.29; Potassium 4.5; Sodium 135  Recent Lipid Panel    Component Value Date/Time   CHOL 128 06/23/2021 0806   CHOL 120 12/24/2019 1605   TRIG 262.0 (H) 06/23/2021 0806   HDL 48.20 06/23/2021 0806   HDL 48 12/24/2019 1605   CHOLHDL 3 06/23/2021 0806   VLDL 52.4 (H) 06/23/2021 0806   LDLCALC 31 12/24/2019 1605   LDLDIRECT 50.0 06/23/2021 0806    Physical Exam:    VS:  BP 132/68 (BP Location: Right Arm, Patient Position: Sitting, Cuff Size: Normal)    Pulse 91    Ht 5' 6.5" (1.689 m)    Wt 172 lb 3.2 oz (78.1 kg)    SpO2 95%    BMI 27.38 kg/m     Wt Readings from Last 3 Encounters:  09/27/21 172 lb 3.2 oz (78.1 kg)  07/19/21 166 lb 12.8 oz (75.7 kg)  07/13/21 168 lb 3.4 oz (76.3 kg)   GEN: Well nourished, well developed in no acute distress HEENT: Normal, moist mucous membranes NECK: No JVD CARDIAC: regular rhythm, normal S1 and S2, no rubs or gallops. No murmur. VASCULAR: Radial and DP pulses 2+ bilaterally. No carotid bruits RESPIRATORY:  Clear to auscultation without rales, wheezing or rhonchi  ABDOMEN: Soft, non-tender, non-distended MUSCULOSKELETAL:  Ambulates independently SKIN: Warm and dry, no edema in lower extremities. Mild bilateral hand joint swelling. NEUROLOGIC:  Alert and oriented x 3. No focal neuro deficits noted. PSYCHIATRIC:  Normal affect    ASSESSMENT:    1. Coronary artery disease of native artery of native heart with stable angina pectoris ([place])   2. Chronic combined systolic and diastolic CHF (congestive heart failure) ([place])   3. Ischemic cardiomyopathy   4. Mixed hyperlipidemia   5. Statin intolerance   6. 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:   -NYHA class II, EF improved to 50-55% on 08/2020 echo (was 40-45%) -tolerating maximum dose entresto, continue -tolerating metoprolol succinate 25 mg daily, trialing increase to 50 mg for stable angina -did not tolerate spironolactone per report -did not tolerate SGLT2i   CAD (predominantly RCA, but diffuse mild disease), with stable angina: -trialing increase in metoprolol from 25 mg to 50 mg daily to see if this improves his stable angina -on aspirin 81 mg -avoid long term NSAIDs given CAD -lipids as  below -discussed red flag warning signs that need immediate medical attention, such as chest tightness that does not resolve with rest  Mixed hyperlipidemia, statin intolerance: -LDL goal <70 -doing well on repatha, last LDL 50 06/23/21 -could not afford vascepa at >$500 -last TG 262, but focusing on lifestyle/diabetes control. Continue to monitor   Type II diabetes, was on long term insulin and now off of this. - A1c 10.6, restarted on Jardiance but could not tolerate due to muscle stiffness -has neuropathy as diabetic complication  Arm/hand swelling: trialed on plaquenil without improvement. Somewhat better with lasix. Counseled that frequent lasix use without volume overload can impact the kidneys  Cardiac risk counseling and prevention recommendations: -recommend heart healthy/Mediterranean diet, with whole grains, fruits, vegetable, fish, lean meats, nuts, and olive oil. Limit salt. -recommend moderate walking, 3-5 times/week for 30-50 minutes each session. Aim for at least 150 minutes.week. Goal should be pace of 3 miles/hours, or walking 1.5 miles in 30 minutes -recommend avoidance of tobacco products.  Avoid excess alcohol.  Plan for follow up: 3 mos or sooner as needed  Buford Dresser, MD, PhD, [facility]   Medication Adjustments/Labs and Tests Ordered: Current medicines are reviewed at length with the patient today.  Concerns regarding medicines are outlined above.  No orders of the defined types were placed in this encounter.  Meds ordered this encounter  Medications   furosemide (LASIX) 20 MG tablet    Sig: Take 1 tablet (20 mg total) by mouth daily as needed for edema.    Dispense:  90 tablet    Refill:  3   nitroGLYCERIN (NITROSTAT) 0.4 MG SL tablet    Sig: Place 1 tablet (0.4 mg total) under the tongue every 5 (five) minutes as needed for chest pain.    Dispense:  90 tablet    Refill:  3   Patient Instructions  Medication Instructions:  Try metoprolol succinate 50 mg daily for a week to see if this helps the angina (chest heaviness/shortness of breath with inclines and heavy exertion). If it helps, then let me know and we will change the prescription.   *If you need a refill on your cardiac medications before your next appointment, please call your pharmacy*   Lab Work: None ordered today   Testing/Procedures: None ordered today   Follow-Up: At [facility], you and your health needs are our priority.  As part of our continuing mission to provide you with exceptional heart care, we have created designated Provider Care Teams.  These Care Teams include your primary Cardiologist (physician) and Advanced Practice Providers (APPs -  Physician Assistants and Nurse Practitioners) who all work together to provide you with the care you need, when you need it.  We recommend signing up for the patient portal called "MyChart".  Sign up information is provided on this After Visit Summary.  MyChart is used to connect with patients for Virtual Visits (Telemedicine).  Patients are able to view lab/test results, encounter notes, upcoming appointments,  etc.  Non-urgent messages can be sent to your provider as well.   To learn more about what you can do with MyChart, go to [email or web address].    Your next appointment:   3 month(s)  The format for your next appointment:   In Person  Provider:   Buford Dresser, MD       Signed, Buford Dresser, MD PhD 09/27/2021    [place]

## 2021-10-06 NOTE — Progress Notes (Signed)
Phone [phone] In person visit   Subjective:   Casey Lynch is a 76 y.o. 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   This visit occurred during the SARS-CoV-2 public health emergency.  Safety protocols were in place, including screening questions prior to the visit, additional usage of staff PPE, and extensive cleaning of exam room while observing appropriate contact time as indicated for disinfecting solutions.   Past Medical History-  Patient Active Problem List   Diagnosis Date Noted   Diabetes type 2, uncontrolled 10/01/2020    Priority: High   Ischemic cardiomyopathy 07/25/2019    Priority: High   Chronic combined systolic and diastolic heart failure ([place]) 07/12/2018    Priority: High   Coronary artery disease due to lipid rich plaque 07/12/2018    Priority: High   Psoriatic arthritis ([place]) 10/14/2021    Priority: Medium    Pseudogout involving multiple joints 10/01/2020    Priority: Medium    Mixed hyperlipidemia 07/12/2018    Priority: Medium    Statin intolerance 07/12/2018    Priority: Medium    Pulmonary HTN ([place]) 06/14/2018    Priority: Medium    COVID-19 07/13/2021    Priority: Low   Personal history of COVID-19 10/25/2020    Priority: Low   Pulmonary emphysema ([place]) 06/14/2018    Priority: Low    Medications- reviewed and updated Current Outpatient Medications  Medication Sig Dispense Refill   aspirin EC 81 MG tablet Take 81 mg by mouth daily. Swallow whole.     B Complex Vitamins (VITAMIN B COMPLEX) TABS Take by mouth.     Cholecalciferol (VITAMIN D3 PO) Take 1 capsule by mouth daily.     colchicine 0.6 MG tablet Take 1 tablet  (0.6 mg total) by mouth daily. 90 tablet 0   CONTOUR NEXT TEST test strip      ENTRESTO 97-103 MG TAKE 1 TABLET BY MOUTH TWICE DAILY 180 tablet 3   Evolocumab (REPATHA SURECLICK) 259 MG/ML SOAJ ADMINISTER 1 ML(140 MG) UNDER THE SKIN EVERY 14 DAYS 6 mL 3   furosemide (LASIX) 20 MG tablet Take 1 tablet (20 mg total) by mouth daily as needed for edema. 90 tablet 3   gabapentin (NEURONTIN) 100 MG capsule Take 1 capsule (100 mg total) by mouth at bedtime as needed. 30 capsule 5   hydroxychloroquine (PLAQUENIL) 200 MG tablet See admin instructions.     Insulin Glargine (BASAGLAR KWIKPEN) 100 UNIT/ML Inject 10-20 Units into the skin daily. Please provide pen needles 15 mL 5   Insulin Pen Needle 31G X 8 MM MISC Use to inject Basaglar daily. Dx:E11.9 90 each 2   metFORMIN (GLUCOPHAGE-XR) 500 MG 24 hr tablet Take 1,000 mg by mouth 2 (two) times daily.     metoprolol succinate (TOPROL-XL) 25 MG 24 hr tablet Take 1 tablet (25 mg total) by mouth daily. 90 tablet 3   Multiple Vitamins-Minerals (PRESERVISION AREDS PO)      nitroGLYCERIN (NITROSTAT) 0.4 MG SL tablet Place 1 tablet (0.4 mg total) under the tongue every 5 (five) minutes as needed for chest pain. 90 tablet 3  triamcinolone (KENALOG) 0.1 % 1 application to affected area     No current facility-administered medications for this visit.     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 Didn't tolerate trulicity- nausea/gerd. Hypoglycemia with Amaryl. - burning stinging pain throughout feet. Injections with podiatry not helpful.  - last visit a lot of baking/carbs CBGs- blood sugar on 10 units was 120-195 butmost readings above 150.  - increased to 15 units on 12/13 and over last 17 readings ranged from 112 up to 205 with 12 readings over 150. Exercise and diet- walking most mornings. Usually 5-10k steps often 7500 Lab Results  Component Value Date   HGBA1C 10.6 (A) 06/21/2021   HGBA1C 9.0 (A) 11/25/2020   HGBA1C 8.6 (H) 10/01/2020   A/P: I am concerned based on sugars despite going up on basaglar to 15 units- I want him to increase to 17 units and update me in 2 weeks with home blood sugars.  - continue metformin -update a1c  - neuropathy bothers him/poor control - had been tried on higher doses of gabapentin- getting shooting pains at night- we opted to try just 100 mg -considering ozempic if cannot get sugar down with insulin  #CAD-follows with Dr. 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 Last seen by Dr. Harrell Gave on 09/27/21 -For heart failure on Entresto 97-103, metoprolol extended release 50 mg, Lasix 20 mg daily only as needed -Had not tolerated spironolactone in the past  -Did not tolerate Jardiance due to excessive urination  - prior nighttime chest pain resolved with change in diet and also increasing metoprolol to 50 mg -very slight weight gain but also was the holidays Lab Results  Component Value Date   CHOL 128 06/23/2021   HDL 48.20 06/23/2021   LDLCALC 31 12/24/2019   LDLDIRECT 50.0 06/23/2021   TRIG 262.0 (H) 06/23/2021    CHOLHDL 3 06/23/2021   A/P: CAD, CHF, PAD stable- continue current medications  #COPD-emphysema/former smoker S: no rx.  No symptoms A/P: doing well without rx- continue to monitor  - enrolled in lung cancer screening as quit in 2018- last scan was 04/19/21 with super D chest 06/04/21 with Dr. Vaughan Browner  #Psoriatic arthritis S: on hydroxychloroquine 200mg  for possible psoriatic arthritis, on turmeric. Follows with Dr. Trudie Reed A/P: reasonable control- continue current meds   # psuedogout S:Colchicine through Dr. Trudie Reed  -started on b complex as well- apparently had lows A/P: no flare ups thankfully- continue to monitor   Recommended follow up: No follow-ups on file. Future Appointments  Date Time Provider [place]  12/21/2021  2:00 PM [facility] CCM PHARMACIST [facility]  12/23/2021  8:00 AM Buford Dresser, MD [facility]  08/25/2022 11:45 AM [facility] HEALTH COACH [facility]   Lab/Order associations:   ICD-10-CM   1. Uncontrolled type 2 diabetes mellitus with hyperglycemia (HCC)  E11.65     2. Mixed hyperlipidemia  E78.2     3. Coronary artery disease due to lipid rich plaque  I25.10    I25.83     4. Chronic combined systolic and diastolic heart failure (HCC)  I50.42     5. PAD (peripheral artery disease) (HCC)  I73.9     6. Pulmonary emphysema, unspecified emphysema type (HCC) Chronic J43.9     7. Myopathy, unspecified  G72.9     8. Psoriatic arthritis ([place])  L40.50     9. Ischemic cardiomyopathy  I25.5      Meds ordered this encounter  Medications   gabapentin (NEURONTIN) 100 MG capsule    Sig: Take 1 capsule (100 mg total) by mouth at bedtime as needed.    Dispense:  30 capsule    Refill:  5    I,Jada Bradford,acting as a scribe for Garret Reddish, MD.,have documented all relevant documentation on the behalf of Garret Reddish, MD,as directed by  Garret Reddish, MD while in the presence of Garret Reddish, MD.  I, Garret Reddish, MD, have reviewed  all documentation for this visit. The documentation on 10/14/21 for the exam, diagnosis, procedures, and orders are all accurate and complete.  Return precautions advised.  Garret Reddish, MD

## 2021-10-08 ENCOUNTER — Encounter (HOSPITAL_BASED_OUTPATIENT_CLINIC_OR_DEPARTMENT_OTHER): Payer: Self-pay

## 2021-10-12 ENCOUNTER — Telehealth: Payer: Self-pay | Admitting: Cardiology

## 2021-10-12 NOTE — Telephone Encounter (Signed)
Spoke with pt who reports Metoprolol Succinate 50 mg - 1 tablet by daily has helped with his Angina.  Pt would like Rx sent to [facility]. Pt advised will forward message to Dr Harrell Gave to make her aware.  Pt verbalizes understanding and agrees with current plan.

## 2021-10-12 NOTE — Telephone Encounter (Signed)
Patient called to say that he and Dr. Harrell Gave were testing an increasing of metoprolol succinate to 50 mg to treat his angina and his has worked.

## 2021-10-14 ENCOUNTER — Encounter: Payer: Self-pay | Admitting: Family Medicine

## 2021-10-14 ENCOUNTER — Ambulatory Visit (INDEPENDENT_AMBULATORY_CARE_PROVIDER_SITE_OTHER): Payer: Medicare Other | Admitting: Family Medicine

## 2021-10-14 ENCOUNTER — Other Ambulatory Visit: Payer: Self-pay

## 2021-10-14 VITALS — BP 128/78 | HR 75 | Temp 97.3°F | Ht 66.5 in | Wt 172.2 lb

## 2021-10-14 DIAGNOSIS — E782 Mixed hyperlipidemia: Secondary | ICD-10-CM | POA: Diagnosis not present

## 2021-10-14 DIAGNOSIS — J439 Emphysema, unspecified: Secondary | ICD-10-CM

## 2021-10-14 DIAGNOSIS — I2510 Atherosclerotic heart disease of native coronary artery without angina pectoris: Secondary | ICD-10-CM | POA: Diagnosis not present

## 2021-10-14 DIAGNOSIS — I5042 Chronic combined systolic (congestive) and diastolic (congestive) heart failure: Secondary | ICD-10-CM

## 2021-10-14 DIAGNOSIS — I739 Peripheral vascular disease, unspecified: Secondary | ICD-10-CM

## 2021-10-14 DIAGNOSIS — L4050 Arthropathic psoriasis, unspecified: Secondary | ICD-10-CM

## 2021-10-14 DIAGNOSIS — I255 Ischemic cardiomyopathy: Secondary | ICD-10-CM

## 2021-10-14 DIAGNOSIS — I2583 Coronary atherosclerosis due to lipid rich plaque: Secondary | ICD-10-CM

## 2021-10-14 DIAGNOSIS — M0600 Rheumatoid arthritis without rheumatoid factor, unspecified site: Secondary | ICD-10-CM | POA: Insufficient documentation

## 2021-10-14 DIAGNOSIS — E1165 Type 2 diabetes mellitus with hyperglycemia: Secondary | ICD-10-CM

## 2021-10-14 DIAGNOSIS — G729 Myopathy, unspecified: Secondary | ICD-10-CM

## 2021-10-14 LAB — COMPREHENSIVE METABOLIC PANEL
ALT: 18 U/L (ref 0–53)
AST: 16 U/L (ref 0–37)
Albumin: 4 g/dL (ref 3.5–5.2)
Alkaline Phosphatase: 110 U/L (ref 39–117)
BUN: 23 mg/dL (ref 6–23)
CO2: 28 mEq/L (ref 19–32)
Calcium: 9.9 mg/dL (ref 8.4–10.5)
Chloride: 101 mEq/L (ref 96–112)
Creatinine, Ser: 1.1 mg/dL (ref 0.40–1.50)
GFR: 65.22 mL/min (ref >60.00)
Glucose, Bld: 184 mg/dL — ABNORMAL HIGH (ref 70–99)
Potassium: 4.4 mEq/L (ref 3.5–5.1)
Sodium: 136 mEq/L (ref 135–145)
Total Bilirubin: 0.4 mg/dL (ref 0.2–1.2)
Total Protein: 7 g/dL (ref 6.0–8.3)

## 2021-10-14 LAB — CBC WITH DIFFERENTIAL/PLATELET
Basophils Absolute: 0.1 10*3/uL (ref 0.0–0.1)
Basophils Relative: 1 % (ref 0.0–3.0)
Eosinophils Absolute: 0.7 10*3/uL (ref 0.0–0.7)
Eosinophils Relative: 7 % — ABNORMAL HIGH (ref 0.0–5.0)
HCT: 37.8 % — ABNORMAL LOW (ref 39.0–52.0)
Hemoglobin: 12.6 g/dL — ABNORMAL LOW (ref 13.0–17.0)
Lymphocytes Relative: 19.3 % (ref 12.0–46.0)
Lymphs Abs: 1.9 10*3/uL (ref 0.7–4.0)
MCHC: 33.2 g/dL (ref 30.0–36.0)
MCV: 88 fl (ref 78.0–100.0)
Monocytes Absolute: 0.9 10*3/uL (ref 0.1–1.0)
Monocytes Relative: 9.4 % (ref 3.0–12.0)
Neutro Abs: 6.2 10*3/uL (ref 1.4–7.7)
Neutrophils Relative %: 63.3 % (ref 43.0–77.0)
Platelets: 319 10*3/uL (ref 150.0–400.0)
RBC: 4.3 Mil/uL (ref 4.22–5.81)
RDW: 14.2 % (ref 11.5–15.5)
WBC: 9.9 10*3/uL (ref 4.0–10.5)

## 2021-10-14 LAB — HEMOGLOBIN A1C: Hgb A1c MFr Bld: 9.1 % — ABNORMAL HIGH (ref 4.6–6.5)

## 2021-10-14 MED ORDER — GABAPENTIN 100 MG PO CAPS: 100.0000 mg | ORAL_CAPSULE | Freq: Every evening | ORAL | 5 refills | Status: DC | PRN

## 2021-10-14 NOTE — Patient Instructions (Addendum)
Please stop by lab before you go If you have mychart- we will send your results within 3 business days of [place] receiving them.  If you do not have mychart- we will call you about results within 5 business days of [place] receiving them.  *please also note that you will see labs on mychart as soon as they post. I will later go in and write notes on them- will say "notes from Dr. Yong Channel"   I am concerned based on sugars despite going up on basaglar to 15 units- I want him to increase to 17 units and update me in 2 weeks with home blood sugars.   Trial low dose gabapentin again- hoping this helps  Recommended follow up: Return in about 14 weeks (around 01/20/2022) for follow up- or sooner if needed.

## 2021-10-15 ENCOUNTER — Telehealth: Payer: Self-pay | Admitting: Pharmacist

## 2021-10-15 NOTE — Progress Notes (Addendum)
Chronic Care Management Pharmacy Assistant   Name: LAKER THOMPSON  MRN: [id number] DOB: September 02, 1946   Reason for Encounter: Diabetes Adherence Call    Recent office visits:  10/14/2021 [facility] (PCP) Marin Olp, MD;  I am concerned based on sugars despite going up on basaglar to 15 units- I want him to increase to 17 units and update me in 2 weeks with home blood sugars - neuropathy bothers him/poor control - had been tried on higher doses of gabapentin- getting shooting pains at night- we opted to try just 100 mg  Recent consult visits:  09/27/2021 [facility] (Cardiology) Buford Dresser, MD; -tolerating metoprolol succinate 25 mg daily, trialing increase to 50 mg for stable angina  Hospital visits:  None in previous 6 months  Medications: Outpatient Encounter Medications as of 10/15/2021  Medication Sig   aspirin EC 81 MG tablet Take 81 mg by mouth daily. Swallow whole.   B Complex Vitamins (VITAMIN B COMPLEX) TABS Take by mouth.   Cholecalciferol (VITAMIN D3 PO) Take 1 capsule by mouth daily.   colchicine 0.6 MG tablet Take 1 tablet (0.6 mg total) by mouth daily.   CONTOUR NEXT TEST test strip    ENTRESTO 97-103 MG TAKE 1 TABLET BY MOUTH TWICE DAILY   Evolocumab (REPATHA SURECLICK) 017 MG/ML SOAJ ADMINISTER 1 ML(140 MG) UNDER THE SKIN EVERY 14 DAYS   furosemide (LASIX) 20 MG tablet Take 1 tablet (20 mg total) by mouth daily as needed for edema.   gabapentin (NEURONTIN) 100 MG capsule Take 1 capsule (100 mg total) by mouth at bedtime as needed.   hydroxychloroquine (PLAQUENIL) 200 MG tablet See admin instructions.   Insulin Glargine (BASAGLAR KWIKPEN) 100 UNIT/ML Inject 10-20 Units into the skin daily. Please provide pen needles   Insulin Pen Needle 31G X 8 MM MISC Use to inject Basaglar daily. Dx:E11.9   metFORMIN (GLUCOPHAGE-XR) 500 MG 24 hr tablet Take 1,000 mg by mouth 2 (two) times daily.   metoprolol succinate (TOPROL-XL) 25 MG 24 hr tablet Take 1 tablet (25 mg total) by  mouth daily.   Multiple Vitamins-Minerals (PRESERVISION AREDS PO)    nitroGLYCERIN (NITROSTAT) 0.4 MG SL tablet Place 1 tablet (0.4 mg total) under the tongue every 5 (five) minutes as needed for chest pain.   triamcinolone (KENALOG) 0.1 % 1 application to affected area   No facility-administered encounter medications on file as of 10/15/2021.   Recent Relevant Labs: Lab Results  Component Value Date/Time   HGBA1C 9.1 (H) 10/14/2021 10:04 AM   HGBA1C 10.6 (A) 06/21/2021 03:27 PM   HGBA1C 9.0 (A) 11/25/2020 11:14 AM   HGBA1C 8.6 (H) 10/01/2020 09:53 AM   MICROALBUR <0.7 10/01/2020 09:53 AM    Kidney Function Lab Results  Component Value Date/Time   CREATININE 1.10 10/14/2021 10:04 AM   CREATININE 1.29 07/19/2021 10:39 AM   GFR 65.22 10/14/2021 10:04 AM   GFRNONAA 58 (L) 12/24/2019 04:05 PM   GFRAA 67 12/24/2019 04:05 PM    Current antihyperglycemic regimen:  Basaglar 10-20 units   What recent interventions/DTPs have been made to improve glycemic control:  Increased Basaglar from 15 units to 17 units, per patient message on 10/27/21 increase to 18 units.  Have there been any recent hospitalizations or ED visits since last visit with CPP? No  Patient denies hypoglycemic symptoms.  Patient denies hyperglycemic symptoms.  How often are you checking your blood sugar? once daily  What are your blood sugars ranging?  Fasting: 178, 152, 135,  180, 182, 140, 182, 137   During the week, how often does your blood glucose drop below 70? Never  Are you checking your feet daily/regularly? Yes, patient states he checks his feet regularly.  Adherence Review: Is the patient currently on a STATIN medication? No Is the patient currently on ACE/ARB medication? Yes Does the patient have >5 day gap between last estimated fill dates? No   Care Gaps: Medicare Annual Wellness: Completed 08/16/2021 Ophthalmology Exam: Next due on 05/27/2022 Foot Exam: Next due on 06/21/2022 Hemoglobin  A1C: 9.1% on 10/14/2021 Colonoscopy: Completed 10/24/2016    Future Appointments  Date Time Provider [place]  12/21/2021  2:00 PM [facility] CCM PHARMACIST [facility]  12/23/2021  8:00 AM Buford Dresser, MD [facility]  01/17/2022  3:40 PM Marin Olp, MD [facility]  08/25/2022 11:45 AM [facility] HEALTH COACH [facility]   Star Rating Drugs: Basaglar last filled 10/12/2021 68 DS Entresto 97-103 mg last filled 10/04/2021 Metformin ER 500 mg last filled 10/04/2021 90 DS  April D Calhoun, [place] Pharmacist Assistant [phone]   9 minutes spent in review, coordination, and documentation.  Chart reviewed, he is now up to 18units of basal insulin.   Follow up next month on glucose readings. Goal of A1c < 8 by next PCP visit.  Reviewed by: Beverly Milch, PharmD Clinical Pharmacist [phone]

## 2021-10-19 MED ORDER — METOPROLOL SUCCINATE ER 50 MG PO TB24: 50.0000 mg | ORAL_TABLET | Freq: Every day | ORAL | 3 refills | Status: DC

## 2021-10-19 NOTE — Telephone Encounter (Signed)
° °  Pt's wife called, she requested for the refill sent to their local pharmacy: [facility], [address] AT [address] -  for 90 days supply

## 2021-10-19 NOTE — Telephone Encounter (Signed)
Ok to refill metoprolol succinate 50 mg daily

## 2021-10-20 MED ORDER — METOPROLOL SUCCINATE ER 50 MG PO TB24: 50.0000 mg | ORAL_TABLET | Freq: Every day | ORAL | 3 refills | Status: DC

## 2021-10-20 NOTE — Addendum Note (Signed)
Addended by: Meryl Crutch on: 10/20/2021 09:51 AM   Modules accepted: Orders

## 2021-10-20 NOTE — Telephone Encounter (Signed)
-  New Rx sent to the requested pharmacy. -Wife made aware.

## 2021-10-27 ENCOUNTER — Telehealth: Payer: Self-pay

## 2021-10-27 NOTE — Telephone Encounter (Signed)
Called and spoke with pt and below message given. 

## 2021-10-27 NOTE — Telephone Encounter (Signed)
Lets go to 18 units and update me in 10-14 days. Let us know if any #s under 80.

## 2021-10-27 NOTE — Telephone Encounter (Signed)
Pt wife dropped off blood sugar readings and would like a response via mychart:  02/10: 180 17 units 02/11: 137 17 units 02/12: 182 17 units 02/13: 182 17 units 02/14: 140 17 units 02/15: 182 17 units 02/16: 180 17 units 02/17: 135 17 units 02/18: n/a 02/19: 152 17 units 02/20 178 17 units

## 2021-11-01 MED ORDER — REPATHA SURECLICK 140 MG/ML ~~LOC~~ SOAJ: SUBCUTANEOUS | 3 refills | Status: DC

## 2021-11-01 NOTE — Telephone Encounter (Signed)
Thanks. I agree that it is best used on the recommended dosing schedule. I don't recommend going off the schedule as we do not have data on outcomes with alternative scheduling. I appreciate your response while I am rounding in the hospital!

## 2021-11-22 DIAGNOSIS — E113293 Type 2 diabetes mellitus with mild nonproliferative diabetic retinopathy without macular edema, bilateral: Secondary | ICD-10-CM | POA: Diagnosis not present

## 2021-11-22 DIAGNOSIS — Z79899 Other long term (current) drug therapy: Secondary | ICD-10-CM | POA: Diagnosis not present

## 2021-11-22 DIAGNOSIS — M069 Rheumatoid arthritis, unspecified: Secondary | ICD-10-CM | POA: Diagnosis not present

## 2021-11-22 LAB — HM DIABETES EYE EXAM

## 2021-11-26 ENCOUNTER — Telehealth: Payer: Self-pay | Admitting: Pharmacist

## 2021-11-26 NOTE — Progress Notes (Signed)
? ? ?  Chronic Care Management ?Pharmacy Assistant  ? ?Name: Casey Lynch  MRN: [id number] DOB: 1946/01/26 ? ? ?Reason for Encounter: Diabetes Adherence Cal ?  ? ?Recent office visits:  ?None ? ?Recent consult visits:  ?None ? ?Hospital visits:  ?None in previous 6 months ? ?Medications: ?Outpatient Encounter Medications as of 11/26/2021  ?Medication Sig  ? aspirin EC 81 MG tablet Take 81 mg by mouth daily. Swallow whole.  ? B Complex Vitamins (VITAMIN B COMPLEX) TABS Take by mouth.  ? Cholecalciferol (VITAMIN D3 PO) Take 1 capsule by mouth daily.  ? colchicine 0.6 MG tablet Take 1 tablet (0.6 mg total) by mouth daily.  ? CONTOUR NEXT TEST test strip   ? ENTRESTO 97-103 MG TAKE 1 TABLET BY MOUTH TWICE DAILY  ? Evolocumab (REPATHA SURECLICK) 409 MG/ML SOAJ ADMINISTER 1 ML(140 MG) UNDER THE SKIN EVERY 14 DAYS  ? furosemide (LASIX) 20 MG tablet Take 1 tablet (20 mg total) by mouth daily as needed for edema.  ? gabapentin (NEURONTIN) 100 MG capsule Take 1 capsule (100 mg total) by mouth at bedtime as needed.  ? hydroxychloroquine (PLAQUENIL) 200 MG tablet See admin instructions.  ? Insulin Glargine (BASAGLAR KWIKPEN) 100 UNIT/ML Inject 10-20 Units into the skin daily. Please provide pen needles  ? Insulin Pen Needle 31G X 8 MM MISC Use to inject Basaglar daily. Dx:E11.9  ? metFORMIN (GLUCOPHAGE-XR) 500 MG 24 hr tablet Take 1,000 mg by mouth 2 (two) times daily.  ? metoprolol succinate (TOPROL-XL) 50 MG 24 hr tablet Take 1 tablet (50 mg total) by mouth daily.  ? Multiple Vitamins-Minerals (PRESERVISION AREDS PO)   ? nitroGLYCERIN (NITROSTAT) 0.4 MG SL tablet Place 1 tablet (0.4 mg total) under the tongue every 5 (five) minutes as needed for chest pain.  ? triamcinolone (KENALOG) 0.1 % 1 application to affected area  ? ?No facility-administered encounter medications on file as of 11/26/2021.  ? ?Recent Relevant Labs: ?Lab Results  ?Component Value Date/Time  ? HGBA1C 9.1 (H) 10/14/2021 10:04 AM  ? HGBA1C 10.6 (A) 06/21/2021  03:27 PM  ? HGBA1C 9.0 (A) 11/25/2020 11:14 AM  ? HGBA1C 8.6 (H) 10/01/2020 09:53 AM  ? MICROALBUR <0.7 10/01/2020 09:53 AM  ?  ?Kidney Function ?Lab Results  ?Component Value Date/Time  ? CREATININE 1.10 10/14/2021 10:04 AM  ? CREATININE 1.29 07/19/2021 10:39 AM  ? GFR 65.22 10/14/2021 10:04 AM  ? GFRNONAA 58 (L) 12/24/2019 04:05 PM  ? GFRAA 67 12/24/2019 04:05 PM  ? ? ?Current antihyperglycemic regimen:  ?Basaglar 10 -20 units daily ?Metformin 500 mg twice daily ? ?Unsuccessful attempt at reaching patient to complete diabetes adherence call ? ? ?Care Gaps: ?Medicare Annual Wellness: Completed 08/16/2021  ?Ophthalmology Exam: Next due on 05/27/2022 ?Foot Exam: Next due on 06/21/2022 ?Hemoglobin A1C: 9.1% on 10/14/2021  ?Colonoscopy: Completed 10/24/2016 ? ?Future Appointments  ?Date Time Provider [place]  ?12/21/2021  2:00 PM [facility] CCM PHARMACIST [facility]  ?12/23/2021  8:00 AM Buford Dresser, MD [facility]  ?01/17/2022  3:40 PM Marin Olp, MD [facility]  ?08/25/2022 11:45 AM [facility] HEALTH COACH [facility]  ? ? ?Star Rating Drugs: ?Basaglar last filled 10/12/2021 68 DS ?Entresto 97-103 mg last filled 10/04/2021 ?Metformin ER 500 mg last filled 10/04/2021 90 DS ? ?April D Calhoun, [place] ?Clinical Pharmacist Assistant ?[phone]  ?

## 2021-11-29 ENCOUNTER — Telehealth: Payer: Self-pay

## 2021-11-29 NOTE — Telephone Encounter (Signed)
I would like for him to increase to 19 units but okay to wait until 2 days after his EGD.  Once again his dose should be reduced to 50% of current dose of 9 units when he has the EGD (EGD was the report I received from team today for upcoming procedure) ?

## 2021-11-29 NOTE — Telephone Encounter (Signed)
Pt came and dropped off Blood Sugar readings: all blood sugars fasting ? ?02-10: 180 17 units ?02-11: 137 17 units ?02-12:182 17 units ?02-13 182 17 units ?02-14 140 17 units ?02-15 182 17 units ?02-16 180 17 units ?02-17 135 17 units ?02-19 152 17 units ?02-20 178 17 units ?02-21 157 17 units ? after walking 6,800 steps I felt like low blood sugar so I rechecked later in the after noon and it was (130 no insulin yet). ? ?02-22 137 17 units ?02-23 152 18 units ?02-24 161 18 units ?02-25 128  18 units ?02-26 107 18 units ?02-27 161 18 units ?02-28 93 18 units ?03-01 169 18 units ?03-02 139 18 units ?03-03 130 18 units  ?03-04 148 18 units ?03-05 139 18 units ?03-06 187 18 units ?03-07 129 18 units ?03-08 165 18 units ?03-09 135 18 units  ?03-10 184 18 units ?03-11 171 18 units ?

## 2021-11-29 NOTE — Telephone Encounter (Signed)
Called and spoke with pt and below message given. 

## 2021-12-01 IMAGING — CT CT CHEST SUPER D W/O CM
2 of 5 series · 15 of 36 positions shown, 18 images · non-contrast
Comparison: 04/19/2021

CLINICAL DATA: Right upper lobe ground-glass nodule. Pre-procedure
planning.

EXAM:
CT CHEST WITHOUT CONTRAST
TECHNIQUE: Multidetector CT imaging of the chest was performed using thin slice
collimation for electromagnetic bronchoscopy planning purposes,
without intravenous contrast.

[Series 4: thins · axial · 0.74mm/px · z∈[-301,-33]mm · 12 of 387 slices shown, 15 images]
[im 26/387  mediastinal]
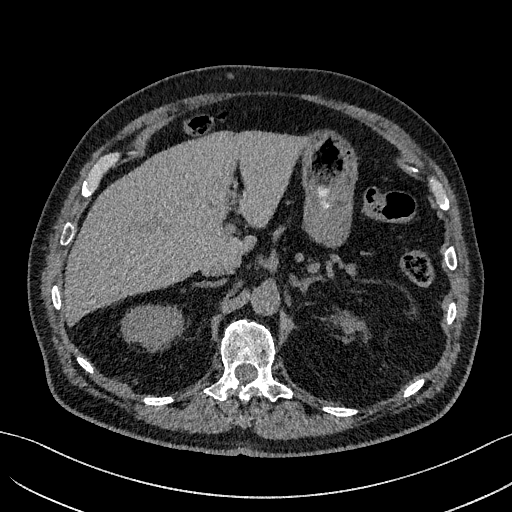
[im 26/387  lung]
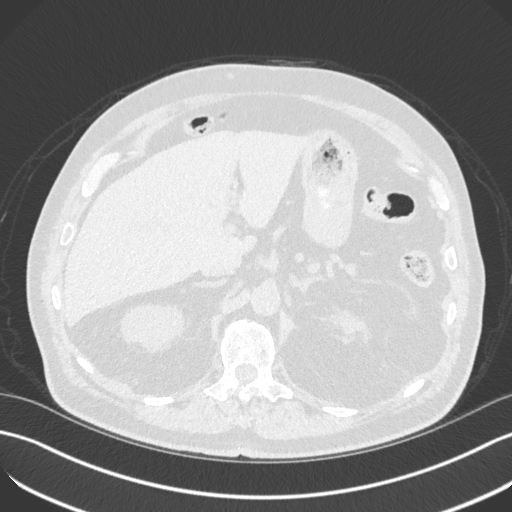
[im 52/387  lung]
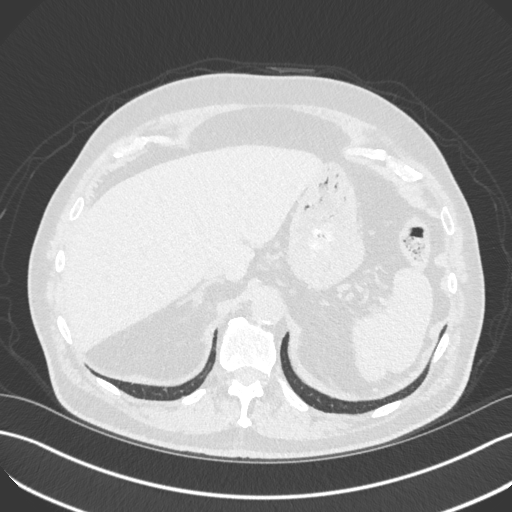
[im 78/387  lung]
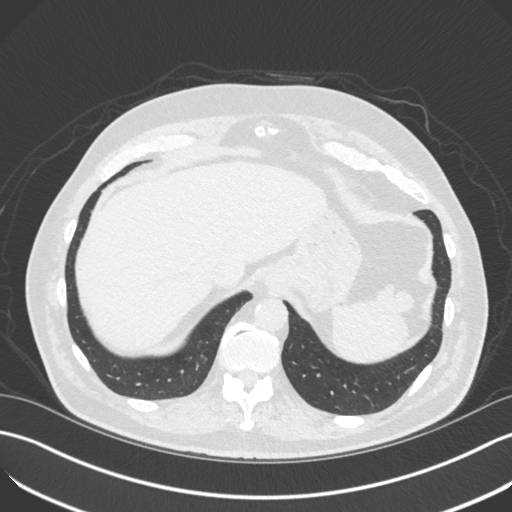
[im 129/387  lung]
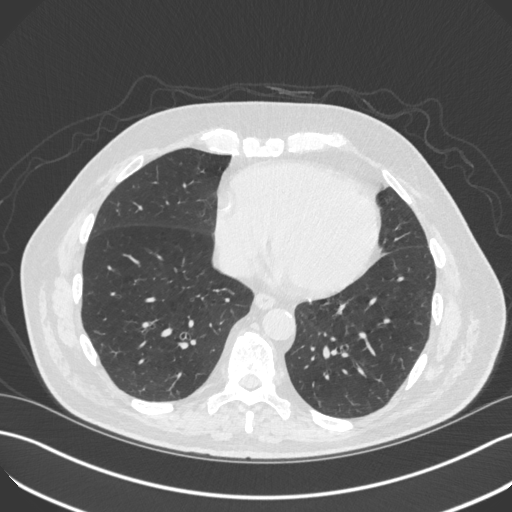
[im 155/387  mediastinal]
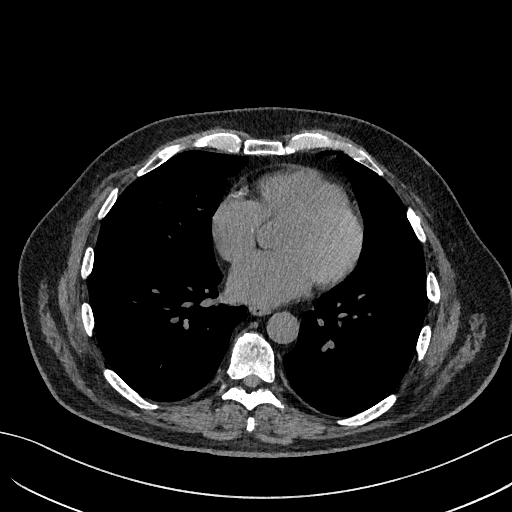
[im 155/387  lung]
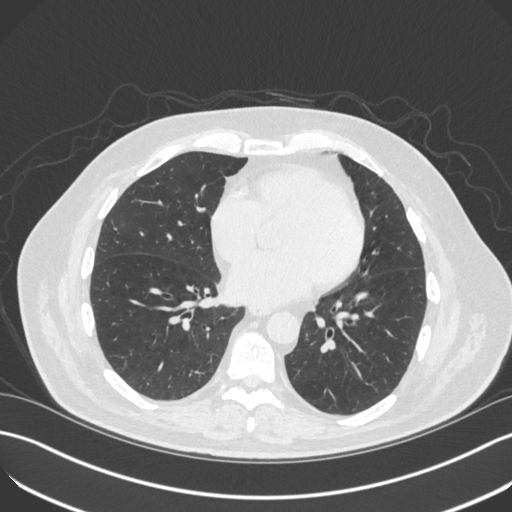
[im 181/387  lung]
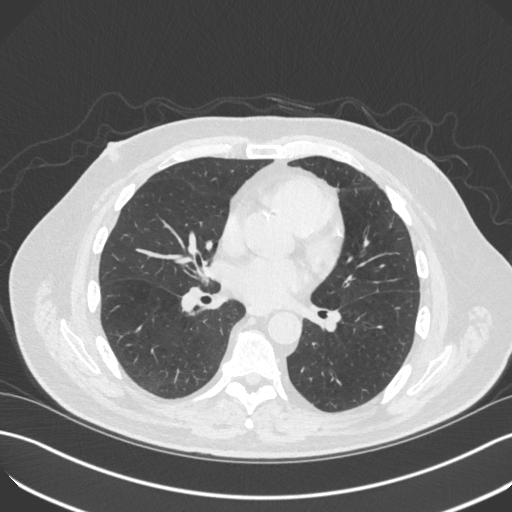
[im 206/387  lung]
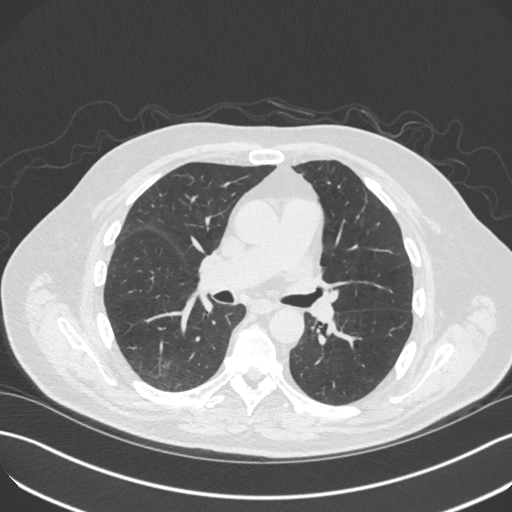
[im 232/387  lung]
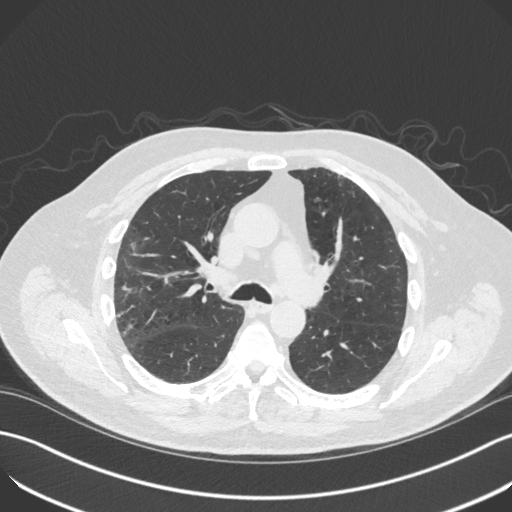
[im 258/387  mediastinal]
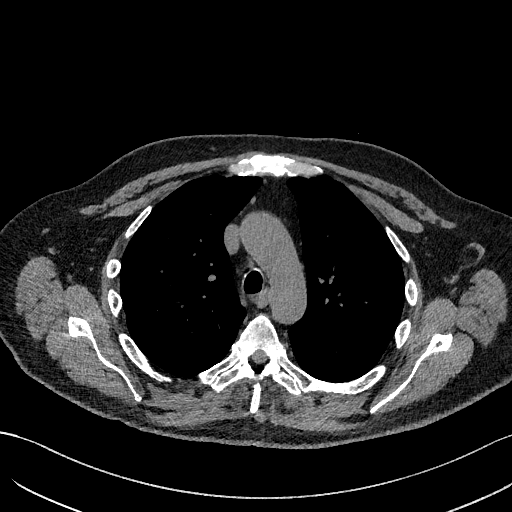
[im 258/387  lung]
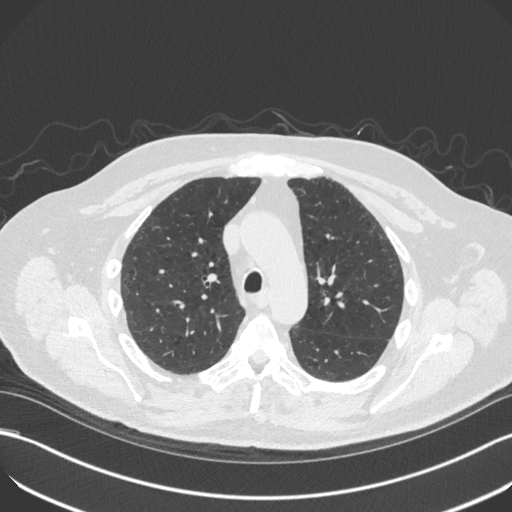
[im 309/387  lung]
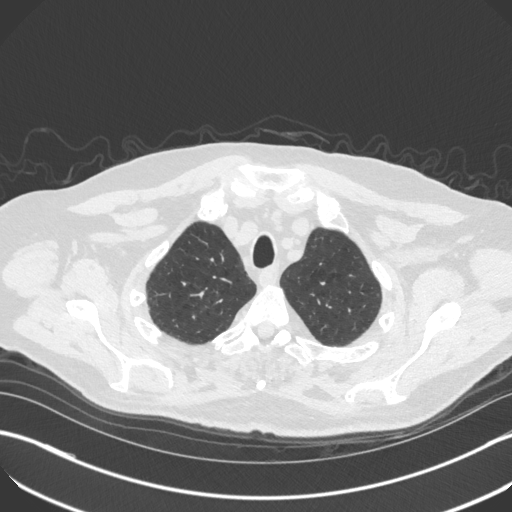
[im 335/387  lung]
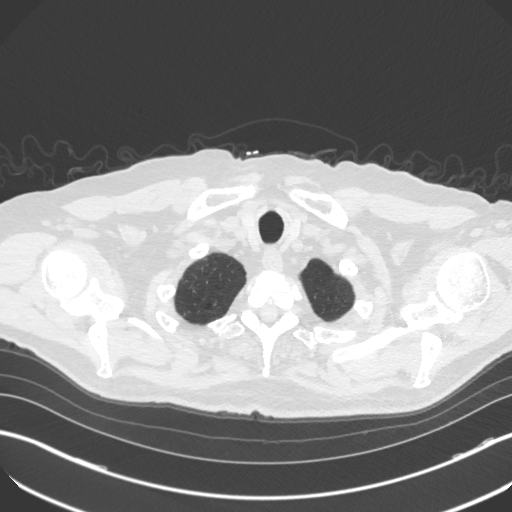
[im 361/387  lung]
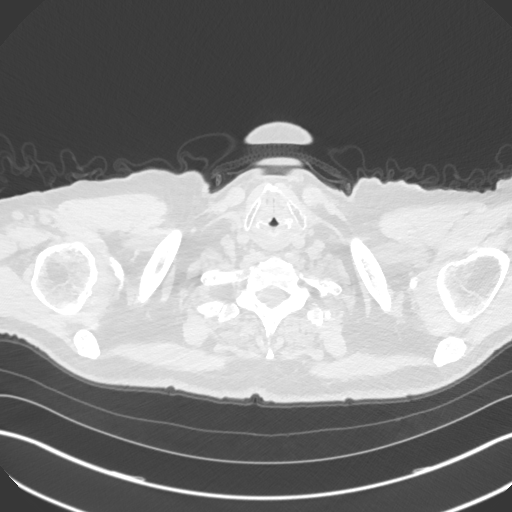

[Series 5: coronal · coronal · 0.61mm/px · 3 of 79 slices shown]
[im 16/79  lung]
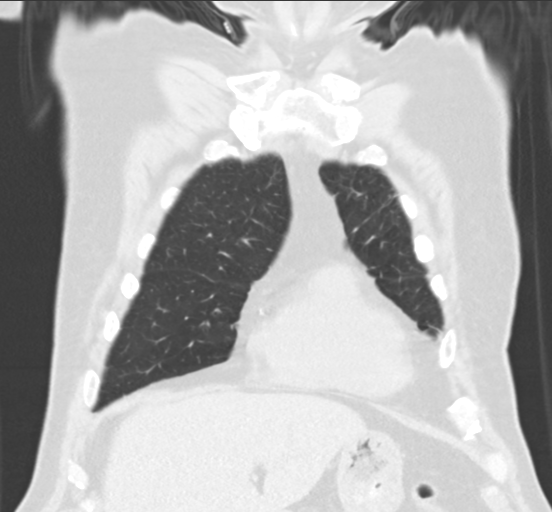
[im 32/79  lung]
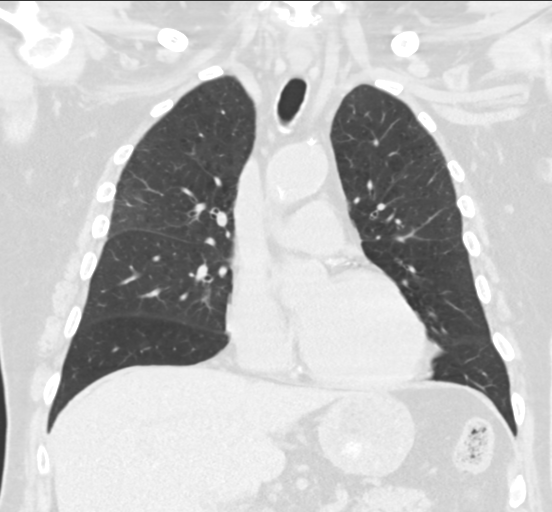
[im 47/79  lung]
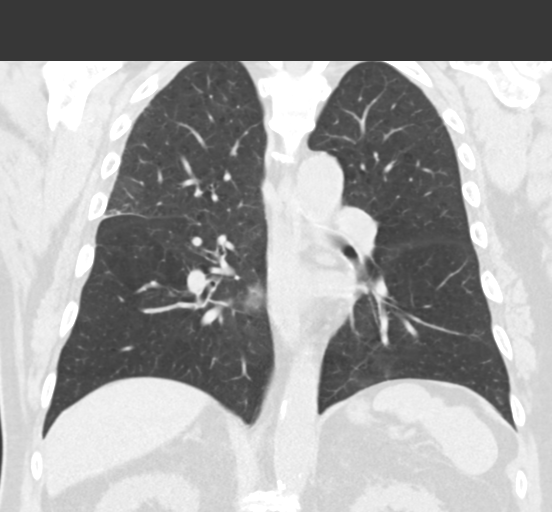

[15 of 36 positions shown; findings below may reference images not displayed]

FINDINGS: Cardiovascular: The heart size is normal. No substantial pericardial
effusion. Coronary artery calcification is evident. Mild
atherosclerotic calcification is noted in the wall of the thoracic
aorta.

Mediastinum/Nodes: No mediastinal lymphadenopathy. No evidence for
gross hilar lymphadenopathy although assessment is limited by the
lack of intravenous contrast on the current study. The esophagus has
normal imaging features. There is no axillary lymphadenopathy.

Lungs/Pleura: Centrilobular emphsyema noted. There is some
persistent subtle ground-glass opacity in the peripheral right upper
lobe posteriorly with associated nodular components of more
consolidative involvement. 1 of the dominant nodular component seen
in the right upper lobe on image 107/3 previously (measuring about 9
mm when remeasured) has resolved in the interval with only some
trace ground-glass opacity at this location today. Similar
improvement in other areas of nodular involvement.

No new suspicious pulmonary nodule or mass. No focal airspace
consolidation. No pleural effusion.

Upper Abdomen: Tiny hypodensity lateral segment left liver is
stable, too small to characterize but likely benign. Possible tiny
hypoattenuating lesion in the tail of pancreas with punctate wall
calcification incompletely visualized on image 387/series 4.

Musculoskeletal: No worrisome lytic or sclerotic osseous
abnormality.
IMPRESSION: 1. Overall improvement in the relatively large region of
ground-glass opacity in the peripheral right upper lobe. The
associated nodular consolidative components are improved in the
interval.
2. Possible tiny hypoattenuating lesion in the tail of pancreas with
punctate wall calcification. This region was not included on any of
the prior chest CTs. Consider abdominal MRI with and without
contrast for definitive characterization.
3. Aortic Atherosclerosis (BATT4-IZ6.6) and Emphysema (BATT4-6MG.S).

## 2021-12-13 NOTE — Progress Notes (Incomplete)
? ?Chronic Care Management ?Pharmacy Note ? ?12/13/2021 ?Name:  Casey Lynch MRN:  [id number] DOB:  [birth year] ? ?Summary: ?Initial visit with PharmD.  Patient A1c 10.6  - he has stopped Jardiance completely due to muscle stiffness.  Two days after stopping symptoms resolved.  He was using 10 units of Basaglar and taking metformin.  Fasting sugars 120-243 - majority 150-160.  No hypoglycemia at all.  Also mentions some neuropathy in his feet especially at night. ? ?Recommendations/Changes made from today's visit: ?Consider increasing Basaglar to 15 units daily ?Consider another trial of Gabapentin 352m hs - he does not remember symptoms he had previously ? ? ?Plan: ?FU glucose in 30 days ?PharmD 4 months ? ? ?Subjective: ?Casey REINDERSis an 76y.o. year old male who is a primary patient of Hunter, SBrayton Mars MD.  The CCM team was consulted for assistance with disease management and care coordination needs.   ? ?Engaged with patient face to face for initial visit in response to provider referral for pharmacy case management and/or care coordination services.  ? ?Consent to Services:  ?The patient was given the following information about Chronic Care Management services today, agreed to services, and gave verbal consent: 1. CCM service includes personalized support from designated clinical staff supervised by the primary care provider, including individualized plan of care and coordination with other care providers 2. 24/7 contact phone numbers for assistance for urgent and routine care needs. 3. Service will only be billed when office clinical staff spend 20 minutes or more in a month to coordinate care. 4. Only one practitioner may furnish and bill the service in a calendar month. 5.The patient may stop CCM services at any time (effective at the end of the month) by phone call to the office staff. 6. The patient will be responsible for cost sharing (co-pay) of up to 20% of the service fee (after annual deductible  is met). Patient agreed to services and consent obtained. ? ?Patient Care Team: ?HMarin Olp MD as PCP - General (Family Medicine) ?CBuford Dresser MD as PCP - Cardiology (Cardiology) ?HGavin Pound MD as Consulting Physician (Rheumatology) ?MMarshell Garfinkel MD as Consulting Physician (Pulmonary Disease) ?DEdythe Clarity RSeven [facility] Instituteas Pharmacist (Pharmacist) ?JJarome Matin MD as Consulting Physician (Dermatology) ? ?Recent office visits:  ?07/19/2021 OV (PCP) HMarin Olp MD; Metformin 1000 mg extended release twice daily with Jardiance 10 mg added last visit (some joint stiffness with this).  We had also discussed adding insulin he had been on Basaglar prior weight loss in the past. -Trulicity 02.97mg once a week but started experiencing nausea and GERD symptoms. - we will add basaglar 10-20 units but start at 10 units  in morning(has bene up to 20 units in the past)  ?  ?07/13/2021 VV HJeanie Sewer NP; molnupiravir 200 mg take 4 capsules 2 times daily for 5 days for COVID-19 ?  ?06/21/2021 OV (PCP) HMarin Olp MD; no medication changes indicated. ?  ?Recent consult visits:  ?06/29/2021 OV (cardiology) CBuford Dresser MD; no medication changes indicated. ?  ?05/17/2021 OV (podiatry) RWallene Huh DPM; no medication changes indicated. ?  ?Hospital visits:  ?02/26/2021 ED Visit for Rib pain on left side ?Rx for Tylenol, Robaxin and Tramadol for breakthrough pain, rib contusion ? ? ?Objective: ? ?Lab Results  ?Component Value Date  ? CREATININE 1.10 10/14/2021  ? BUN 23 10/14/2021  ? GFR 65.22 10/14/2021  ? GFRNONAA 58 (L) 12/24/2019  ? GFRAA 67 12/24/2019  ?  NA 136 10/14/2021  ? K 4.4 10/14/2021  ? CALCIUM 9.9 10/14/2021  ? CO2 28 10/14/2021  ? GLUCOSE 184 (H) 10/14/2021  ? ? ?Lab Results  ?Component Value Date/Time  ? HGBA1C 9.1 (H) 10/14/2021 10:04 AM  ? HGBA1C 10.6 (A) 06/21/2021 03:27 PM  ? HGBA1C 9.0 (A) 11/25/2020 11:14 AM  ? HGBA1C 8.6 (H) 10/01/2020 09:53 AM  ? GFR  65.22 10/14/2021 10:04 AM  ? GFR 53.96 (L) 07/19/2021 10:39 AM  ? MICROALBUR <0.7 10/01/2020 09:53 AM  ?  ?Last diabetic Eye exam:  ?Lab Results  ?Component Value Date/Time  ? HMDIABEYEEXA Retinopathy (A) 12/03/2021 12:00 AM  ?  ?Last diabetic Foot exam: No results found for: HMDIABFOOTEX  ? ?Lab Results  ?Component Value Date  ? CHOL 128 06/23/2021  ? HDL 48.20 06/23/2021  ? [place] 31 12/24/2019  ? LDLDIRECT 50.0 06/23/2021  ? TRIG 262.0 (H) 06/23/2021  ? CHOLHDL 3 06/23/2021  ? ? ? ?  Latest Ref Rng & Units 10/14/2021  ? 10:04 AM 07/19/2021  ? 10:39 AM 06/23/2021  ?  8:06 AM  ?Hepatic Function  ?Total Protein 6.0 - 8.3 g/dL 7.0   7.6   7.1    ?Albumin 3.5 - 5.2 g/dL 4.0   4.3   4.2    ?AST 0 - 37 U/L 16   14   15     ?ALT 0 - 53 U/L 18   16   20     ?Alk Phosphatase 39 - 117 U/L 110   88   96    ?Total Bilirubin 0.2 - 1.2 mg/dL 0.4   0.3   0.3    ? ? ?Lab Results  ?Component Value Date/Time  ? TSH 4.47 10/01/2020 09:53 AM  ? ? ? ?  Latest Ref Rng & Units 10/14/2021  ? 10:04 AM 06/23/2021  ?  8:06 AM 10/01/2020  ?  9:53 AM  ?CBC  ?WBC 4.0 - 10.5 K/uL 9.9   9.2   8.6    ?Hemoglobin 13.0 - 17.0 g/dL 12.6   12.1   12.4    ?Hematocrit 39.0 - 52.0 % 37.8   36.5   37.3    ?Platelets 150.0 - 400.0 K/uL 319.0   345.0   384.0    ? ? ?No results found for: VD25OH ? ?Clinical ASCVD: Yes  ?The ASCVD Risk score (Arnett DK, et al., 2019) failed to calculate for the following reasons: ?  The valid total cholesterol range is 130 to 320 mg/dL   ? ? ?  08/16/2021  ? 11:39 AM 06/21/2021  ?  2:26 PM 11/25/2020  ? 10:34 AM  ?Depression screen PHQ 2/9  ?Decreased Interest 0 0 0  ?Down, Depressed, Hopeless 0 0 0  ?PHQ - 2 Score 0 0 0  ?  ? ? ?Social History  ? ?Tobacco Use  ?Smoking Status Former  ? Packs/day: 1.00  ? Years: 59.00  ? Pack years: 59.00  ? Types: Cigarettes  ? Quit date: 09/05/2016  ? Years since quitting: 5.2  ?Smokeless Tobacco Never  ? ?BP Readings from Last 3 Encounters:  ?10/14/21 128/78  ?09/27/21 132/68  ?07/19/21 112/64   ? ?Pulse Readings from Last 3 Encounters:  ?10/14/21 75  ?09/27/21 91  ?07/19/21 80  ? ?Wt Readings from Last 3 Encounters:  ?10/14/21 172 lb 3.2 oz (78.1 kg)  ?09/27/21 172 lb 3.2 oz (78.1 kg)  ?07/19/21 166 lb 12.8 oz (75.7 kg)  ? ?BMI Readings from Last 3  Encounters:  ?10/14/21 27.38 kg/m?  ?09/27/21 27.38 kg/m?  ?07/19/21 26.52 kg/m?  ? ? ?Assessment/Interventions: Review of patient past medical history, allergies, medications, health status, including review of consultants reports, laboratory and other test data, was performed as part of comprehensive evaluation and provision of chronic care management services.  ? ?SDOH:  (Social Determinants of Health) assessments and interventions performed: Yes ? ?Financial Resource Strain: Low Risk   ? Difficulty of Paying Living Expenses: Not hard at all  ? ? ?SDOH Screenings  ? ?Alcohol Screen: Not on file  ?Depression (PHQ2-9): Low Risk   ? PHQ-2 Score: 0  ?Financial Resource Strain: Low Risk   ? Difficulty of Paying Living Expenses: Not hard at all  ?Food Insecurity: No Food Insecurity  ? Worried About Charity fundraiser in the Last Year: Never true  ? Ran Out of Food in the Last Year: Never true  ?Housing: Low Risk   ? Last Housing Risk Score: 0  ?Physical Activity: Sufficiently Active  ? Days of Exercise per Week: 4 days  ? Minutes of Exercise per Session: 60 min  ?Social Connections: Socially Integrated  ? Frequency of Communication with Friends and Family: More than three times a week  ? Frequency of Social Gatherings with Friends and Family: More than three times a week  ? Attends Religious Services: More than 4 times per year  ? Active Member of Clubs or Organizations: Yes  ? Attends Archivist Meetings: 1 to 4 times per year  ? Marital Status: Married  ?Stress: No Stress Concern Present  ? Feeling of Stress : Not at all  ?Tobacco Use: Medium Risk  ? Smoking Tobacco Use: Former  ? Smokeless Tobacco Use: Never  ? Passive Exposure: Not on file   ?Transportation Needs: No Transportation Needs  ? Lack of Transportation (Medical): No  ? Lack of Transportation (Non-Medical): No  ? ? ?CCM Care Plan ? ?Allergies  ?Allergen Reactions  ? Jardiance [Empagliflozin

## 2021-12-20 DIAGNOSIS — E119 Type 2 diabetes mellitus without complications: Secondary | ICD-10-CM | POA: Diagnosis not present

## 2021-12-20 DIAGNOSIS — D135 Benign neoplasm of extrahepatic bile ducts: Secondary | ICD-10-CM | POA: Diagnosis not present

## 2021-12-20 DIAGNOSIS — D132 Benign neoplasm of duodenum: Secondary | ICD-10-CM | POA: Diagnosis not present

## 2021-12-21 ENCOUNTER — Telehealth: Payer: Medicare Other

## 2021-12-21 NOTE — Progress Notes (Signed)
?Cardiology Office Note:   ? ?Date:  12/23/2021  ? ?ID:  Casey Lynch, DOB Oct 06, 1945, MRN [id number] ? ?PCP:  Marin Olp, MD  ?Cardiologist:  Buford Dresser, MD ? ?Referring MD: Marin Olp, MD  ? ?CC: follow up ? ?History of Present Illness:   ? ?Casey Lynch is a 76 y.o. male with a hx of with CAD, chronic systolic and diastolic heart failure, ischemic cardiomyopathy, hyperlipidemia, PAD (carotid bruit <50% bilaterally, left subclavian steal syndrome), COPD, former tobacco abuse, OSA on CPAP, type II diabetes. I met him 07/11/2018 and his initial cardiac evaluation from [place] is summarized in that note. ? ?Today: ?He is accompanied by his wife and doing well. He continues to become short of breath with exertion like walking up inclines. However, his chest pain has resolved. He denies chest pain,  PND, orthopnea, LE edema, syncope or palpitations. ? ?His most recent A1c is 9.1. He did not tolerate Jardiance and Trulicity. He is currently taking 18 mg Basaglar and metformin. Since starting insulin, he has gained weight. He is open to trying [place].  ? ?He stays active by walking at least 8000 steps. 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  ? ?Past Medical History:  ?Diagnosis Date  ? Carotid bruit   ? Diabetes mellitus without complication ([place])   ? Eczema   ? Heart failure ([place])   ? History of colonic polyps   ? Hypercholesterolemia   ? Hyperlipidemia   ? Macular degeneration   ? Osteoarthritis   ? Paresthesias   ? Pseudogout   ? Subclavian steal syndrome   ? ? ?Past Surgical History:  ?Procedure Laterality Date  ? CARDIAC CATHETERIZATION    ? COLONOSCOPY WITH PROPOFOL N/A 10/24/2016  ? Procedure: COLONOSCOPY WITH PROPOFOL;  Surgeon: Garlan Fair, MD;  Location: [facility];  Service: Endoscopy;  Laterality: N/A;  ? HYDROCELE EXCISION / REPAIR    ? KNEE SURGERY    ? REFRACTIVE SURGERY    ? TONSILLECTOMY    ? ? ?Current  Medications: ?Current Outpatient Medications on File Prior to Visit  ?Medication Sig  ? aspirin EC 81 MG tablet Take 81 mg by mouth daily. Swallow whole.  ? B Complex Vitamins (VITAMIN B COMPLEX) TABS Take by mouth.  ? Cholecalciferol (VITAMIN D3 PO) Take 1 capsule by mouth daily.  ? colchicine 0.6 MG tablet Take 1 tablet (0.6 mg total) by mouth daily.  ? CONTOUR NEXT TEST test strip   ? ENTRESTO 97-103 MG TAKE 1 TABLET BY MOUTH TWICE DAILY  ? Evolocumab (REPATHA SURECLICK) 197 MG/ML SOAJ ADMINISTER 1 ML(140 MG) UNDER THE SKIN EVERY 14 DAYS  ? furosemide (LASIX) 20 MG tablet Take 1 tablet (20 mg total) by mouth daily as needed for edema.  ? gabapentin (NEURONTIN) 100 MG capsule Take 1 capsule (100 mg total) by mouth at bedtime as needed.  ? hydroxychloroquine (PLAQUENIL) 200 MG tablet See admin instructions.  ? Insulin Glargine (BASAGLAR KWIKPEN) 100 UNIT/ML Inject 10-20 Units into the skin daily. Please provide pen needles  ? Insulin Pen Needle 31G X 8 MM MISC Use to inject Basaglar daily. Dx:E11.9  ? metFORMIN (GLUCOPHAGE-XR) 500 MG 24 hr tablet Take 1,000 mg by mouth 2 (two) times daily.  ? metoprolol succinate (TOPROL-XL) 50 MG 24 hr tablet Take 1 tablet (50 mg total) by mouth daily.  ? Multiple Vitamins-Minerals (PRESERVISION AREDS PO)   ? nitroGLYCERIN (NITROSTAT) 0.4  MG SL tablet Place 1 tablet (0.4 mg total) under the tongue every 5 (five) minutes as needed for chest pain.  ? triamcinolone (KENALOG) 0.1 % 1 application to affected area  ? ?No current facility-administered medications on file prior to visit.  ?  ? ?Allergies:   Jardiance [empagliflozin], Statins, and Tetanus toxoids  ? ?Social History  ? ?Tobacco Use  ? Smoking status: Former  ?  Packs/day: 1.00  ?  Years: 59.00  ?  Pack years: 59.00  ?  Types: Cigarettes  ?  Quit date: 09/05/2016  ?  Years since quitting: 5.3  ? Smokeless tobacco: Never  ?Vaping Use  ? Vaping Use: Never used  ?Substance Use Topics  ? Alcohol use: Yes  ?  Alcohol/week: 0.0  standard drinks  ?  Comment: Rare  ? Drug use: No  ? ? ?Family History: ?family history includes Bipolar disorder in his brother; Cancer in his paternal grandmother; Diabetes in his brother; Heart failure in his father. ? ?ROS:   ?Please see the history of present illness.   ?(+) Bilateral feet nerve pain ?Additional pertinent ROS otherwise unremarkable. ? ?EKGs/Labs/Other Studies Reviewed:   ? ?The following studies were reviewed today: ? ?CT Chest 06/04/2021: ?COMPARISON:  04/19/2021 ?  ?FINDINGS: ?Cardiovascular: The heart size is normal. No substantial pericardial ?effusion. Coronary artery calcification is evident. Mild ?atherosclerotic calcification is noted in the wall of the thoracic ?aorta. ?  ?Mediastinum/Nodes: No mediastinal lymphadenopathy. No evidence for ?gross hilar lymphadenopathy although assessment is limited by the ?lack of intravenous contrast on the current study. The esophagus has ?normal imaging features. There is no axillary lymphadenopathy. ?  ?Lungs/Pleura: Centrilobular emphsyema noted. There is some ?persistent subtle ground-glass opacity in the peripheral right upper ?lobe posteriorly with associated nodular components of more ?consolidative involvement. 1 of the dominant nodular component seen ?in the right upper lobe on image 107/3 previously (measuring about 9 ?mm when remeasured) has resolved in the interval with only some ?trace ground-glass opacity at this location today. Similar ?improvement in other areas of nodular involvement. ?  ?No new suspicious pulmonary nodule or mass. No focal airspace ?consolidation. No pleural effusion. ?  ?Upper Abdomen: Tiny hypodensity lateral segment left liver is ?stable, too small to characterize but likely benign. Possible tiny ?hypoattenuating lesion in the tail of pancreas with punctate wall ?calcification incompletely visualized on image 387/series 4. ?  ?Musculoskeletal: No worrisome lytic or sclerotic osseous ?abnormality. ?   ?IMPRESSION: ?1. Overall improvement in the relatively large region of ?ground-glass opacity in the peripheral right upper lobe. The ?associated nodular consolidative components are improved in the ?interval. ?2. Possible tiny hypoattenuating lesion in the tail of pancreas with ?punctate wall calcification. This region was not included on any of ?the prior chest CTs. Consider abdominal MRI with and without ?contrast for definitive characterization. ?3. Aortic Atherosclerosis (ICD10-I70.0) and Emphysema (ICD10-J43.9). ? ?Echo 08/18/20 ?1. Left ventricular ejection fraction, by estimation, is 50 to 55%. The  ?left ventricle has low normal function. The left ventricle has no regional  ?wall motion abnormalities. Left ventricular diastolic parameters are  ?consistent with Grade I diastolic  ?dysfunction (impaired relaxation).  ? 2. Right ventricular systolic function is normal. The right ventricular  ?size is mildly enlarged.  ? 3. The mitral valve is normal in structure. Trivial mitral valve  ?regurgitation. No evidence of mitral stenosis.  ? 4. The aortic valve is tricuspid. Aortic valve regurgitation is not  ?visualized. No aortic stenosis is present.  ? 5. The  inferior vena cava is normal in size with greater than 50%  ?respiratory variability, suggesting right atrial pressure of 3 mmHg. ? ?Stress test 08/14/20 ?Nuclear stress EF: 51%. ?The left ventricular ejection fraction is mildly decreased (45-54%). ?There was no ST segment deviation noted during stress. ?Findings consistent with prior myocardial infarction. ?This is a low risk study. ?  ?Small inferior basal wall infarct no ischemia EF 51% ?  ?Echo 02/19/19 ? 1. The left ventricle has mild-moderately reduced systolic function, with an ejection fraction of 40-45%. The cavity size was normal. Left ventricular diastolic Doppler parameters are consistent with impaired relaxation. Left ventricular diffuse  ?hypokinesis, with slightly worse function in the lateral  wall. ? 2. The right ventricle has normal systolic function. The cavity was normal. There is no increase in right ventricular wall thickness. Right ventricular systolic pressure could not be assessed. ? 3. The aortic valve is tr

## 2021-12-23 ENCOUNTER — Telehealth: Payer: Self-pay | Admitting: Family Medicine

## 2021-12-23 ENCOUNTER — Ambulatory Visit (HOSPITAL_BASED_OUTPATIENT_CLINIC_OR_DEPARTMENT_OTHER): Payer: Medicare Other | Admitting: Cardiology

## 2021-12-23 ENCOUNTER — Encounter (HOSPITAL_BASED_OUTPATIENT_CLINIC_OR_DEPARTMENT_OTHER): Payer: Self-pay | Admitting: Cardiology

## 2021-12-23 VITALS — BP 130/72 | HR 78 | Ht 66.0 in | Wt 176.9 lb

## 2021-12-23 DIAGNOSIS — E782 Mixed hyperlipidemia: Secondary | ICD-10-CM

## 2021-12-23 DIAGNOSIS — I5042 Chronic combined systolic (congestive) and diastolic (congestive) heart failure: Secondary | ICD-10-CM

## 2021-12-23 DIAGNOSIS — I255 Ischemic cardiomyopathy: Secondary | ICD-10-CM | POA: Diagnosis not present

## 2021-12-23 DIAGNOSIS — I25118 Atherosclerotic heart disease of native coronary artery with other forms of angina pectoris: Secondary | ICD-10-CM

## 2021-12-23 DIAGNOSIS — Z789 Other specified health status: Secondary | ICD-10-CM

## 2021-12-23 DIAGNOSIS — E1159 Type 2 diabetes mellitus with other circulatory complications: Secondary | ICD-10-CM

## 2021-12-23 DIAGNOSIS — Z794 Long term (current) use of insulin: Secondary | ICD-10-CM

## 2021-12-23 MED ORDER — FREESTYLE LIBRE 3 SENSOR MISC: 12 refills | Status: DC

## 2021-12-23 MED ORDER — DAPAGLIFLOZIN PROPANEDIOL 10 MG PO TABS: 10.0000 mg | ORAL_TABLET | Freq: Every day | ORAL | 11 refills | Status: DC

## 2021-12-23 NOTE — Patient Instructions (Addendum)
Medication Instructions:  ?Try Farxiga 10 mg daily before breakfast. Please call our office in 2 weeks for an update. ? ?*If you need a refill on your cardiac medications before your next appointment, please call your pharmacy* ? ? ?Lab Work: ?None ordered today ? ? ?Testing/Procedures: ?None ordered today ? ? ?Follow-Up: ?At [facility], you and your health needs are our priority.  As part of our continuing mission to provide you with exceptional heart care, we have created designated Provider Care Teams.  These Care Teams include your primary Cardiologist (physician) and Advanced Practice Providers (APPs -  Physician Assistants and Nurse Practitioners) who all work together to provide you with the care you need, when you need it. ? ?We recommend signing up for the patient portal called "MyChart".  Sign up information is provided on this After Visit Summary.  MyChart is used to connect with patients for Virtual Visits (Telemedicine).  Patients are able to view lab/test results, encounter notes, upcoming appointments, etc.  Non-urgent messages can be sent to your provider as well.   ?To learn more about what you can do with MyChart, go to [email or web address].   ? ?Your next appointment:   ?3 month(s) ? ?The format for your next appointment:   ?In Person ? ?Provider:   ?Buford Dresser, MD{ ? ? ?Important Information About Sugar ? ? ? ? ? ? ?

## 2021-12-23 NOTE — Telephone Encounter (Signed)
Patient dropped off sugar readings:  ? ?3/28 118 18  ?3/29 118 18  ?3/30 97 18 ?13,000 steps ?3/31 145 18 ?4/1 100 18 ?4/2 161 18 ?4/3 147 18 ?4/4 169 18 ?4/5 115 18 ?4/6 116 18 ?4/7 179 18 ?4/8 132 18 ?4/9 157 18 ?4/10 146 18 ?4/11 151 18 ?4/12 104 18 ?4/13 145 18 ?4/14 162 18 ?4/15 147 18 ?4/16 168 18 ?4/17 168  18 ?Endoscopy   ?4/18 148 18 ?4/19 137 18 ?4/20 128 18  ? ?Pt also mentioned his cardiologist recommend him to start [place] '10mg'$ .- pt would like to know your thoughts.   ?

## 2021-12-23 NOTE — Telephone Encounter (Addendum)
Sugars between 97 and 179 in the morning. Several readings around 100- readings above 160 x6. I am hesitant to increase insulin above 18 units given he is also going to trial farxiga 10 mg given to him by cardiology through samples ? ?Cardiology also mentioned CGM- I think that's reasonable and have sent in freestyle 3 sensor- he will use app on phone  ?

## 2021-12-23 NOTE — Telephone Encounter (Signed)
See below

## 2021-12-29 DIAGNOSIS — L821 Other seborrheic keratosis: Secondary | ICD-10-CM | POA: Diagnosis not present

## 2021-12-29 DIAGNOSIS — L853 Xerosis cutis: Secondary | ICD-10-CM | POA: Diagnosis not present

## 2021-12-29 DIAGNOSIS — D225 Melanocytic nevi of trunk: Secondary | ICD-10-CM | POA: Diagnosis not present

## 2021-12-29 DIAGNOSIS — L570 Actinic keratosis: Secondary | ICD-10-CM | POA: Diagnosis not present

## 2022-01-17 ENCOUNTER — Ambulatory Visit (INDEPENDENT_AMBULATORY_CARE_PROVIDER_SITE_OTHER): Payer: Medicare Other | Admitting: Family Medicine

## 2022-01-17 ENCOUNTER — Encounter: Payer: Self-pay | Admitting: Family Medicine

## 2022-01-17 VITALS — BP 112/64 | HR 73 | Temp 98.0°F | Ht 66.0 in | Wt 173.2 lb

## 2022-01-17 DIAGNOSIS — E782 Mixed hyperlipidemia: Secondary | ICD-10-CM

## 2022-01-17 DIAGNOSIS — M609 Myositis, unspecified: Secondary | ICD-10-CM

## 2022-01-17 DIAGNOSIS — I5042 Chronic combined systolic (congestive) and diastolic (congestive) heart failure: Secondary | ICD-10-CM

## 2022-01-17 DIAGNOSIS — I2583 Coronary atherosclerosis due to lipid rich plaque: Secondary | ICD-10-CM

## 2022-01-17 DIAGNOSIS — E1165 Type 2 diabetes mellitus with hyperglycemia: Secondary | ICD-10-CM

## 2022-01-17 DIAGNOSIS — I255 Ischemic cardiomyopathy: Secondary | ICD-10-CM | POA: Diagnosis not present

## 2022-01-17 DIAGNOSIS — I2510 Atherosclerotic heart disease of native coronary artery without angina pectoris: Secondary | ICD-10-CM | POA: Diagnosis not present

## 2022-01-17 NOTE — Progress Notes (Addendum)
?Phone [phone] ?In person visit ?  ?Subjective:  ? ?Casey Lynch is a 76 y.o. year old very pleasant male patient who presents for/with See problem oriented charting ?Chief Complaint  ?Patient presents with  ? Follow-up  ? Diabetes  ? ?Past Medical History-  ?Patient Active Problem List  ? Diagnosis Date Noted  ? Poorly controlled diabetes mellitus ([place]) 10/01/2020  ?  Priority: High  ? Ischemic cardiomyopathy 07/25/2019  ?  Priority: High  ? Chronic combined systolic and diastolic heart failure ([place]) 07/12/2018  ?  Priority: High  ? Coronary artery disease due to lipid rich plaque 07/12/2018  ?  Priority: High  ? Psoriatic arthritis ([place]) 10/14/2021  ?  Priority: Medium   ? Pseudogout involving multiple joints 10/01/2020  ?  Priority: Medium   ? Mixed hyperlipidemia 07/12/2018  ?  Priority: Medium   ? Statin intolerance 07/12/2018  ?  Priority: Medium   ? Pulmonary HTN ([place]) 06/14/2018  ?  Priority: Medium   ? COVID-19 07/13/2021  ?  Priority: Low  ? Personal history of COVID-19 10/25/2020  ?  Priority: Low  ? Pulmonary emphysema ([place]) 06/14/2018  ?  Priority: Low  ? ? ?Medications- reviewed and updated ?Current Outpatient Medications  ?Medication Sig Dispense Refill  ? aspirin EC 81 MG tablet Take 81 mg by mouth daily. Swallow whole.    ? B Complex Vitamins (VITAMIN B COMPLEX) TABS Take by mouth.    ? Cholecalciferol (VITAMIN D3 PO) Take 1 capsule by mouth daily.    ? colchicine 0.6 MG tablet Take 1 tablet (0.6 mg total) by mouth daily. 90 tablet 0  ? Continuous Blood Gluc Sensor (FREESTYLE LIBRE 3 SENSOR) MISC Quantity: 2 sensors/month NDC# [phone]. [id number] 2 each 12  ? CONTOUR NEXT TEST test strip     ? ENTRESTO 97-103 MG TAKE 1 TABLET BY MOUTH TWICE DAILY 180 tablet 3  ? Evolocumab (REPATHA SURECLICK) 062 MG/ML SOAJ ADMINISTER 1 ML(140 MG) UNDER THE SKIN EVERY 14 DAYS 6 mL 3  ? furosemide (LASIX) 20 MG tablet Take 1 tablet (20 mg total) by mouth daily as needed for edema. 90 tablet 3  ? gabapentin  (NEURONTIN) 100 MG capsule Take 1 capsule (100 mg total) by mouth at bedtime as needed. 30 capsule 5  ? hydroxychloroquine (PLAQUENIL) 200 MG tablet See admin instructions.    ? Insulin Glargine (BASAGLAR KWIKPEN) 100 UNIT/ML Inject 10-20 Units into the skin daily. Please provide pen needles 15 mL 5  ? Insulin Pen Needle 31G X 8 MM MISC Use to inject Basaglar daily. Dx:E11.9 90 each 2  ? metFORMIN (GLUCOPHAGE-XR) 500 MG 24 hr tablet Take 1,000 mg by mouth 2 (two) times daily.    ? metoprolol succinate (TOPROL-XL) 50 MG 24 hr tablet Take 1 tablet (50 mg total) by mouth daily. 90 tablet 3  ? Multiple Vitamins-Minerals (PRESERVISION AREDS PO)     ? triamcinolone (KENALOG) 0.1 % 1 application to affected area    ? nitroGLYCERIN (NITROSTAT) 0.4 MG SL tablet Place 1 tablet (0.4 mg total) under the tongue every 5 (five) minutes as needed for chest pain. 90 tablet 3  ? ?No current facility-administered medications for this visit.  ? ?  ?Objective:  ?BP 112/64   Pulse 73   Temp 98 ?F (36.7 ?C)   Ht '5\' 6"'$  (1.676 m)   Wt 173 lb 3.2 oz (78.6 kg)   SpO2 96%   BMI 27.96 kg/m?  ?Gen: NAD, resting comfortably ?CV: RRR  no murmurs rubs or gallops ?Lungs: CTAB no crackles, wheeze, rhonchi ?Ext: no edema ?Skin: warm, dry ?  ? ?Assessment and Plan  ? ?#advanced directives- brought to bring on file- each knows each others wishes. Cares more about long term outcomes- no long term life support. Would want full code up front if witnessed. Would want antibiotics, IV fluids if indicated, feeding tube for defined trial period.  ? 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 Injections with podiatry not helpful. Is at  least able to sleep with gabapentin ?CBGs- 130s or 140s in AM. 1 low on farxiga but none outside of that- adressed easily ?Exercise and diet- walking  10k steps a day and golf twice a week  ?Lab Results  ?Component Value Date  ? HGBA1C 9.1 (H) 10/14/2021  ? HGBA1C 10.6 (A) 06/21/2021  ? HGBA1C 9.0 (A) 11/25/2020  ?A/P: hopefully improved- suspect with morning sugars may need to titrate further but also want to avoid lows- hold off on formal changes until we see a1c. I think 7.5 is reasonable goal.  ? ?#CAD-follows with Dr. 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 ?No CP or SOB. No edema or weight gain- gets hand swelling but rarely needs lasix ? A/P: CAD asymptomatic- continue current meds. CHF stable- continue current meds. PAD likely stable with same meds. Lipids doing well even though statin intolerant ?  ?#COPD/former smoker ?S: medication: none ?A/P: overall stable without meds- continue to monitor  ?-in lung cancer screening program ? ?#Psoriatic arthritis ?S: on hydroxychloroquine '200mg'$   for possible psoriatic arthritis, on turmeric.  Follows with Dr. Trudie Reed ?A/P: reports stable  ?-forward labs to Dr. Trudie Reed ? 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 ?Recommended follow up: Return in about 4 months (around 05/20/2022) for physical or sooner if  needed.Schedule b4 you leave. ?Future Appointments  ?Date Time Provider [place]  ?03/28/2022  8:00 AM Buford Dresser, MD [facility]  ?08/25/2022 11:45 AM [facility] HEALTH COACH [facility]  ? ? ?Lab/Order associations: ?  ICD-10-CM   ?1. Poorly controlled diabetes mellitus ([place])  E11.65 CBC with Differential/Platelet  ?  Comprehensive metabolic panel  ?  Hemoglobin A1c  ?  ?2. Ischemic cardiomyopathy  I25.5   ?  ?3. Chronic combined systolic and diastolic heart failure ([facility])  I50.42   ?  ?4. Coronary artery disease due to lipid rich plaque  I25.10   ? I25.83   ?  ?5. Mixed hyperlipidemia  E78.2   ?  ?6. Myositis, unspecified myositis type, unspecified site  M60.9   ?  ? ? ?No orders of the defined types were placed in this encounter. ? ? ?Return  precautions advised.  ?Garret Reddish, MD ? ?

## 2022-01-17 NOTE — Patient Instructions (Addendum)
Thanks for stopping by lab ?If you have mychart- we will send your results within 3 business days of [place] receiving them.  ?If you do not have mychart- we will call you about results within 5 business days of [place] receiving them.  ?*please also note that you will see labs on mychart as soon as they post. I will later go in and write notes on them- will say "notes from Dr. Yong Channel"  ? ?Likely increase insulin but wait until we get results ? ?Recommended follow up: Return in about 4 months (around 05/20/2022) for physical or sooner if needed.Schedule b4 you leave. ? ?

## 2022-01-18 ENCOUNTER — Encounter: Payer: Self-pay | Admitting: Family Medicine

## 2022-01-18 DIAGNOSIS — Z87891 Personal history of nicotine dependence: Secondary | ICD-10-CM | POA: Diagnosis not present

## 2022-01-18 DIAGNOSIS — I340 Nonrheumatic mitral (valve) insufficiency: Secondary | ICD-10-CM | POA: Diagnosis not present

## 2022-01-18 DIAGNOSIS — Z86010 Personal history of colonic polyps: Secondary | ICD-10-CM | POA: Diagnosis not present

## 2022-01-18 DIAGNOSIS — D135 Benign neoplasm of extrahepatic bile ducts: Secondary | ICD-10-CM | POA: Diagnosis not present

## 2022-01-18 DIAGNOSIS — Z79899 Other long term (current) drug therapy: Secondary | ICD-10-CM | POA: Diagnosis not present

## 2022-01-18 DIAGNOSIS — E119 Type 2 diabetes mellitus without complications: Secondary | ICD-10-CM | POA: Diagnosis not present

## 2022-01-18 DIAGNOSIS — I10 Essential (primary) hypertension: Secondary | ICD-10-CM | POA: Diagnosis not present

## 2022-01-18 DIAGNOSIS — G4733 Obstructive sleep apnea (adult) (pediatric): Secondary | ICD-10-CM | POA: Diagnosis not present

## 2022-01-18 DIAGNOSIS — Z7984 Long term (current) use of oral hypoglycemic drugs: Secondary | ICD-10-CM | POA: Diagnosis not present

## 2022-01-18 DIAGNOSIS — Z888 Allergy status to other drugs, medicaments and biological substances status: Secondary | ICD-10-CM | POA: Diagnosis not present

## 2022-01-18 DIAGNOSIS — Z8719 Personal history of other diseases of the digestive system: Secondary | ICD-10-CM | POA: Diagnosis not present

## 2022-01-18 DIAGNOSIS — Z8379 Family history of other diseases of the digestive system: Secondary | ICD-10-CM | POA: Diagnosis not present

## 2022-01-18 DIAGNOSIS — I272 Pulmonary hypertension, unspecified: Secondary | ICD-10-CM | POA: Diagnosis not present

## 2022-01-18 DIAGNOSIS — Z833 Family history of diabetes mellitus: Secondary | ICD-10-CM | POA: Diagnosis not present

## 2022-01-18 DIAGNOSIS — K317 Polyp of stomach and duodenum: Secondary | ICD-10-CM | POA: Diagnosis not present

## 2022-01-18 DIAGNOSIS — M109 Gout, unspecified: Secondary | ICD-10-CM | POA: Diagnosis not present

## 2022-01-18 DIAGNOSIS — I255 Ischemic cardiomyopathy: Secondary | ICD-10-CM | POA: Diagnosis not present

## 2022-01-18 LAB — CBC WITH DIFFERENTIAL/PLATELET
Basophils Absolute: 0.1 10*3/uL (ref 0.0–0.1)
Basophils Relative: 1.3 % (ref 0.0–3.0)
Eosinophils Absolute: 0.8 10*3/uL — ABNORMAL HIGH (ref 0.0–0.7)
Eosinophils Relative: 9.4 % — ABNORMAL HIGH (ref 0.0–5.0)
HCT: 36.8 % — ABNORMAL LOW (ref 39.0–52.0)
Hemoglobin: 12.3 g/dL — ABNORMAL LOW (ref 13.0–17.0)
Lymphocytes Relative: 24.1 % (ref 12.0–46.0)
Lymphs Abs: 2.1 10*3/uL (ref 0.7–4.0)
MCHC: 33.3 g/dL (ref 30.0–36.0)
MCV: 90.2 fl (ref 78.0–100.0)
Monocytes Absolute: 0.7 10*3/uL (ref 0.1–1.0)
Monocytes Relative: 7.8 % (ref 3.0–12.0)
Neutro Abs: 5 10*3/uL (ref 1.4–7.7)
Neutrophils Relative %: 57.4 % (ref 43.0–77.0)
Platelets: 335 10*3/uL (ref 150.0–400.0)
RBC: 4.08 Mil/uL — ABNORMAL LOW (ref 4.22–5.81)
RDW: 14.7 % (ref 11.5–15.5)
WBC: 8.8 10*3/uL (ref 4.0–10.5)

## 2022-01-18 LAB — COMPREHENSIVE METABOLIC PANEL
ALT: 20 U/L (ref 0–53)
AST: 18 U/L (ref 0–37)
Albumin: 4.1 g/dL (ref 3.5–5.2)
Alkaline Phosphatase: 82 U/L (ref 39–117)
BUN: 27 mg/dL — ABNORMAL HIGH (ref 6–23)
CO2: 24 mEq/L (ref 19–32)
Calcium: 9.8 mg/dL (ref 8.4–10.5)
Chloride: 103 mEq/L (ref 96–112)
Creatinine, Ser: 1.26 mg/dL (ref 0.40–1.50)
GFR: 55.31 mL/min — ABNORMAL LOW (ref >60.00)
Glucose, Bld: 204 mg/dL — ABNORMAL HIGH (ref 70–99)
Potassium: 4.5 mEq/L (ref 3.5–5.1)
Sodium: 136 mEq/L (ref 135–145)
Total Bilirubin: 0.2 mg/dL (ref 0.2–1.2)
Total Protein: 6.8 g/dL (ref 6.0–8.3)

## 2022-01-18 LAB — HEMOGLOBIN A1C: Hgb A1c MFr Bld: 8.1 % — ABNORMAL HIGH (ref 4.6–6.5)

## 2022-01-28 DIAGNOSIS — Z794 Long term (current) use of insulin: Secondary | ICD-10-CM | POA: Diagnosis not present

## 2022-01-28 DIAGNOSIS — D135 Benign neoplasm of extrahepatic bile ducts: Secondary | ICD-10-CM | POA: Diagnosis not present

## 2022-01-28 DIAGNOSIS — I255 Ischemic cardiomyopathy: Secondary | ICD-10-CM | POA: Diagnosis not present

## 2022-01-28 DIAGNOSIS — E119 Type 2 diabetes mellitus without complications: Secondary | ICD-10-CM | POA: Diagnosis not present

## 2022-01-28 DIAGNOSIS — Z7984 Long term (current) use of oral hypoglycemic drugs: Secondary | ICD-10-CM | POA: Diagnosis not present

## 2022-01-28 DIAGNOSIS — Z79899 Other long term (current) drug therapy: Secondary | ICD-10-CM | POA: Diagnosis not present

## 2022-01-28 DIAGNOSIS — I4510 Unspecified right bundle-branch block: Secondary | ICD-10-CM | POA: Diagnosis not present

## 2022-01-28 DIAGNOSIS — I498 Other specified cardiac arrhythmias: Secondary | ICD-10-CM | POA: Diagnosis not present

## 2022-01-28 DIAGNOSIS — E11649 Type 2 diabetes mellitus with hypoglycemia without coma: Secondary | ICD-10-CM | POA: Diagnosis not present

## 2022-01-28 DIAGNOSIS — Z9989 Dependence on other enabling machines and devices: Secondary | ICD-10-CM | POA: Diagnosis not present

## 2022-01-28 DIAGNOSIS — Z87891 Personal history of nicotine dependence: Secondary | ICD-10-CM | POA: Diagnosis not present

## 2022-01-28 DIAGNOSIS — G4733 Obstructive sleep apnea (adult) (pediatric): Secondary | ICD-10-CM | POA: Diagnosis not present

## 2022-01-28 DIAGNOSIS — I493 Ventricular premature depolarization: Secondary | ICD-10-CM | POA: Diagnosis not present

## 2022-01-29 DIAGNOSIS — I493 Ventricular premature depolarization: Secondary | ICD-10-CM | POA: Diagnosis not present

## 2022-01-29 DIAGNOSIS — Z9989 Dependence on other enabling machines and devices: Secondary | ICD-10-CM | POA: Diagnosis not present

## 2022-01-29 DIAGNOSIS — Z79899 Other long term (current) drug therapy: Secondary | ICD-10-CM | POA: Diagnosis not present

## 2022-01-29 DIAGNOSIS — D135 Benign neoplasm of extrahepatic bile ducts: Secondary | ICD-10-CM | POA: Diagnosis not present

## 2022-01-29 DIAGNOSIS — I255 Ischemic cardiomyopathy: Secondary | ICD-10-CM | POA: Diagnosis not present

## 2022-01-29 DIAGNOSIS — E11649 Type 2 diabetes mellitus with hypoglycemia without coma: Secondary | ICD-10-CM | POA: Diagnosis not present

## 2022-01-29 DIAGNOSIS — E119 Type 2 diabetes mellitus without complications: Secondary | ICD-10-CM | POA: Diagnosis not present

## 2022-01-29 DIAGNOSIS — Z7984 Long term (current) use of oral hypoglycemic drugs: Secondary | ICD-10-CM | POA: Diagnosis not present

## 2022-01-29 DIAGNOSIS — Z87891 Personal history of nicotine dependence: Secondary | ICD-10-CM | POA: Diagnosis not present

## 2022-01-29 DIAGNOSIS — G4733 Obstructive sleep apnea (adult) (pediatric): Secondary | ICD-10-CM | POA: Diagnosis not present

## 2022-01-29 DIAGNOSIS — Z794 Long term (current) use of insulin: Secondary | ICD-10-CM | POA: Diagnosis not present

## 2022-02-10 ENCOUNTER — Telehealth: Payer: Self-pay | Admitting: Pharmacist

## 2022-02-10 NOTE — Progress Notes (Signed)
Chronic Care Management Pharmacy Assistant   Name: Casey Lynch  MRN: [id number] DOB: 12-04-1945   Reason for Encounter: Diabetes Adherence Call    Recent office visits:  01/17/2022 [facility] (PCP) Marin Olp, MD; no medication changes indicated.  Recent consult visits:  01/18/2022 Initial Consult (General Surgery) Jyl Heinz, MD; no medication changes indicated.  12/23/2021 [facility] (Cardiology) Buford Dresser, MD; no medication changes indicated.  Hospital visits:  None in previous 6 months  Medications: Outpatient Encounter Medications as of 02/10/2022  Medication Sig   aspirin EC 81 MG tablet Take 81 mg by mouth daily. Swallow whole.   B Complex Vitamins (VITAMIN B COMPLEX) TABS Take by mouth.   Cholecalciferol (VITAMIN D3 PO) Take 1 capsule by mouth daily.   colchicine 0.6 MG tablet Take 1 tablet (0.6 mg total) by mouth daily.   Continuous Blood Gluc Sensor (FREESTYLE LIBRE 3 SENSOR) MISC Quantity: 2 sensors/month NDC# [phone]. [id number]   CONTOUR NEXT TEST test strip    Evolocumab (REPATHA SURECLICK) 578 MG/ML SOAJ ADMINISTER 1 ML(140 MG) UNDER THE SKIN EVERY 14 DAYS   furosemide (LASIX) 20 MG tablet Take 1 tablet (20 mg total) by mouth daily as needed for edema.   gabapentin (NEURONTIN) 100 MG capsule Take 1 capsule (100 mg total) by mouth at bedtime as needed.   hydroxychloroquine (PLAQUENIL) 200 MG tablet See admin instructions.   Insulin Glargine (BASAGLAR KWIKPEN) 100 UNIT/ML Inject 10-20 Units into the skin daily. Please provide pen needles   Insulin Pen Needle 31G X 8 MM MISC Use to inject Basaglar daily. Dx:E11.9   metFORMIN (GLUCOPHAGE-XR) 500 MG 24 hr tablet Take 1,000 mg by mouth 2 (two) times daily.   metoprolol succinate (TOPROL-XL) 50 MG 24 hr tablet Take 1 tablet (50 mg total) by mouth daily.   Multiple Vitamins-Minerals (PRESERVISION AREDS PO)    nitroGLYCERIN (NITROSTAT) 0.4 MG SL tablet Place 1 tablet (0.4 mg total) under the tongue every 5  (five) minutes as needed for chest pain.   triamcinolone (KENALOG) 0.1 % 1 application to affected area   [DISCONTINUED] ENTRESTO 97-103 MG TAKE 1 TABLET BY MOUTH TWICE DAILY   No facility-administered encounter medications on file as of 02/10/2022.   Recent Relevant Labs: Lab Results  Component Value Date/Time   HGBA1C 8.1 (H) 01/17/2022 04:36 PM   HGBA1C 9.1 (H) 10/14/2021 10:04 AM   MICROALBUR <0.7 10/01/2020 09:53 AM    Kidney Function Lab Results  Component Value Date/Time   CREATININE 1.26 01/17/2022 04:36 PM   CREATININE 1.10 10/14/2021 10:04 AM   GFR 55.31 (L) 01/17/2022 04:36 PM   GFRNONAA 58 (L) 12/24/2019 04:05 PM   GFRAA 67 12/24/2019 04:05 PM    Current antihyperglycemic regimen:  Basaglar 10-20 units daily Metformin 500 mg two times daily  What recent interventions/DTPs have been made to improve glycemic control:  No recent interventions or DTPs.  Have there been any recent hospitalizations or ED visits since last visit with CPP? No  Patient denies hypoglycemic symptoms.  Patient denies hyperglycemic symptoms.  How often are you checking your blood sugar? once daily  What are your blood sugars ranging?  Fasting: 100-110  During the week, how often does your blood glucose drop below 70? Never  Are you checking your feet daily/regularly? Yes  Adherence Review: Is the patient currently on a STATIN medication? No Is the patient currently on ACE/ARB medication? Yes Does the patient have >5 day gap between last estimated fill dates? No  Care Gaps: Medicare Annual Wellness: Completed 08/16/2021  Ophthalmology Exam: Next due on 05/27/2022 Foot Exam: Next due on 06/21/2022 Hemoglobin A1C: 8.1% on 01/17/2022 Colonoscopy: Completed 10/24/2016  Future Appointments  Date Time Provider [place]  03/28/2022  8:00 AM Buford Dresser, MD [facility]  07/04/2022  2:00 PM Marin Olp, MD [facility]  08/25/2022 11:45 AM [facility] HEALTH COACH  [facility]   Star Rating Drugs: Metformin 500 mg last filled 02/14/2022 90 DS Entresto 97-103 mg last filled 02/14/2022 90 DS Basaglar 100 unit/mL last filled 12/29/2021   April D Calhoun, [place] Pharmacist Assistant [phone]

## 2022-02-14 ENCOUNTER — Other Ambulatory Visit: Payer: Self-pay | Admitting: Cardiology

## 2022-02-14 NOTE — Telephone Encounter (Signed)
Rx request sent to pharmacy.  

## 2022-02-15 DIAGNOSIS — M1991 Primary osteoarthritis, unspecified site: Secondary | ICD-10-CM | POA: Diagnosis not present

## 2022-02-15 DIAGNOSIS — M1120 Other chondrocalcinosis, unspecified site: Secondary | ICD-10-CM | POA: Diagnosis not present

## 2022-02-15 DIAGNOSIS — M0609 Rheumatoid arthritis without rheumatoid factor, multiple sites: Secondary | ICD-10-CM | POA: Diagnosis not present

## 2022-02-15 DIAGNOSIS — Z79899 Other long term (current) drug therapy: Secondary | ICD-10-CM | POA: Diagnosis not present

## 2022-02-24 ENCOUNTER — Other Ambulatory Visit: Payer: Self-pay | Admitting: Family Medicine

## 2022-03-10 DIAGNOSIS — K838 Other specified diseases of biliary tract: Secondary | ICD-10-CM | POA: Diagnosis not present

## 2022-03-10 DIAGNOSIS — K8689 Other specified diseases of pancreas: Secondary | ICD-10-CM | POA: Diagnosis not present

## 2022-03-10 DIAGNOSIS — Z9889 Other specified postprocedural states: Secondary | ICD-10-CM | POA: Diagnosis not present

## 2022-03-10 DIAGNOSIS — K317 Polyp of stomach and duodenum: Secondary | ICD-10-CM | POA: Diagnosis not present

## 2022-03-10 DIAGNOSIS — D132 Benign neoplasm of duodenum: Secondary | ICD-10-CM | POA: Diagnosis not present

## 2022-03-10 DIAGNOSIS — Z8719 Personal history of other diseases of the digestive system: Secondary | ICD-10-CM | POA: Diagnosis not present

## 2022-03-28 ENCOUNTER — Encounter (HOSPITAL_BASED_OUTPATIENT_CLINIC_OR_DEPARTMENT_OTHER): Payer: Self-pay | Admitting: Cardiology

## 2022-03-28 ENCOUNTER — Ambulatory Visit (HOSPITAL_BASED_OUTPATIENT_CLINIC_OR_DEPARTMENT_OTHER): Payer: Medicare Other | Admitting: Cardiology

## 2022-03-28 VITALS — BP 126/74 | HR 77 | Ht 66.0 in | Wt 171.6 lb

## 2022-03-28 DIAGNOSIS — I25118 Atherosclerotic heart disease of native coronary artery with other forms of angina pectoris: Secondary | ICD-10-CM

## 2022-03-28 DIAGNOSIS — E782 Mixed hyperlipidemia: Secondary | ICD-10-CM

## 2022-03-28 DIAGNOSIS — I5042 Chronic combined systolic (congestive) and diastolic (congestive) heart failure: Secondary | ICD-10-CM

## 2022-03-28 DIAGNOSIS — I255 Ischemic cardiomyopathy: Secondary | ICD-10-CM | POA: Diagnosis not present

## 2022-03-28 DIAGNOSIS — Z794 Long term (current) use of insulin: Secondary | ICD-10-CM

## 2022-03-28 DIAGNOSIS — Z789 Other specified health status: Secondary | ICD-10-CM | POA: Diagnosis not present

## 2022-03-28 DIAGNOSIS — E1159 Type 2 diabetes mellitus with other circulatory complications: Secondary | ICD-10-CM

## 2022-03-28 NOTE — Progress Notes (Signed)
Cardiology Office Note:    Date:  03/28/2022   ID:  Casey Lynch, DOB Nov 07, 1945, MRN [id number]  PCP:  Marin Olp, MD  Cardiologist:  Buford Dresser, MD  Referring MD: Marin Olp, MD   CC: follow up  History of Present Illness:    Casey Lynch is a 77 y.o. male with a hx of with CAD, chronic systolic and diastolic heart failure, ischemic cardiomyopathy, hyperlipidemia, PAD (carotid bruit <50% bilaterally, left subclavian steal syndrome), COPD, former tobacco abuse, OSA on CPAP, type II diabetes. I met him 07/11/2018 and his initial cardiac evaluation from [place] is summarized in that note.  At his last appointment, he continued to have DOE with walking up inclines, but his chest pain had resolved. His A1C at the time was 9.1. He did not tolerate Jardiance and Trulicity. He was taking 18 mg Basaglar and metformin. Since starting insulin, he had gained weight. He was open to trying [place], so this was started. He endorsed diabetic neuropathy in his bilateral feet to his toes but it didn't limit his activity (walking at least 8000 steps).   Today: He is accompanied by a family member. He continues to struggle with intermittent morning episodes of explosive diarrhea. This has been ongoing for a while. Reviewed his medications, no recent changes. He is back on Noom but his symptoms predate this.  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 He notes this vomiting has been an issue only at his past two procedures.  He denies any recurring chest pain or angina. Sometimes he will become short of breath with walking during exercise, but he generally recovers quickly. For activity he enjoys golf.  Lately his fasting blood sugars have been averaging 130-135. While taking repatha he notes some pain caused by the injection.  He remains compliant with his CPAP, although he notes this is frequently  uncomfortable and doesn't always stay in place through the night.  He denies any palpitations, or peripheral edema. No lightheadedness, headaches, syncope, orthopnea, or PND.   Past Medical History:  Diagnosis Date   Carotid bruit    Diabetes mellitus without complication (HCC)    Eczema    Heart failure ([place])    History of colonic polyps    Hypercholesterolemia    Hyperlipidemia    Macular degeneration    Osteoarthritis    Paresthesias    Pseudogout    Subclavian steal syndrome     Past Surgical History:  Procedure Laterality Date   CARDIAC CATHETERIZATION     COLONOSCOPY WITH PROPOFOL N/A 10/24/2016   Procedure: COLONOSCOPY WITH PROPOFOL;  Surgeon: Garlan Fair, MD;  Location: [facility];  Service: Endoscopy;  Laterality: N/A;   HYDROCELE EXCISION / REPAIR     KNEE SURGERY     REFRACTIVE SURGERY     TONSILLECTOMY      Current Medications: Current Outpatient Medications on File Prior to Visit  Medication Sig   aspirin EC 81 MG tablet Take 81 mg by mouth daily. Swallow whole.   B Complex Vitamins (VITAMIN B COMPLEX) TABS Take by mouth.   Cholecalciferol (VITAMIN D3 PO) Take 1 capsule by mouth daily.   colchicine 0.6 MG tablet Take 1 tablet (0.6 mg total) by mouth daily.   Continuous Blood Gluc Sensor (FREESTYLE LIBRE 3 SENSOR) MISC Quantity: 2 sensors/month NDC# [phone]. [id number]   CONTOUR NEXT TEST test strip    ENTRESTO 97-103 MG  TAKE 1 TABLET BY MOUTH TWICE DAILY   Evolocumab (REPATHA SURECLICK) 891 MG/ML SOAJ ADMINISTER 1 ML(140 MG) UNDER THE SKIN EVERY 14 DAYS   furosemide (LASIX) 20 MG tablet Take 1 tablet (20 mg total) by mouth daily as needed for edema.   gabapentin (NEURONTIN) 100 MG capsule Take 1 capsule (100 mg total) by mouth at bedtime as needed.   hydroxychloroquine (PLAQUENIL) 200 MG tablet Take 200 mg by mouth 2 (two) times daily.   Insulin Glargine (BASAGLAR KWIKPEN) 100 UNIT/ML Inject 10-20 Units into the skin daily. Please provide pen  needles   Insulin Pen Needle 31G X 8 MM MISC Use to inject Basaglar daily. Dx:E11.9   metFORMIN (GLUCOPHAGE-XR) 500 MG 24 hr tablet TAKE 2 TABLETS BY MOUTH TWICE DAILY WITH MEALS   metoprolol succinate (TOPROL-XL) 50 MG 24 hr tablet Take 1 tablet (50 mg total) by mouth daily.   triamcinolone (KENALOG) 0.1 % 1 application to affected area   nitroGLYCERIN (NITROSTAT) 0.4 MG SL tablet Place 1 tablet (0.4 mg total) under the tongue every 5 (five) minutes as needed for chest pain.   No current facility-administered medications on file prior to visit.     Allergies:   Jardiance [empagliflozin], Statins, and Tetanus toxoids   Social History   Tobacco Use   Smoking status: Former    Packs/day: 1.00    Years: 59.00    Total pack years: 59.00    Types: Cigarettes    Quit date: 09/05/2016    Years since quitting: 5.5   Smokeless tobacco: Never  Vaping Use   Vaping Use: Never used  Substance Use Topics   Alcohol use: Yes    Alcohol/week: 0.0 standard drinks of alcohol    Comment: Rare   Drug use: No    Family History: family history includes Bipolar disorder in his brother; Cancer in his paternal grandmother; Diabetes in his brother; Heart failure in his father.  ROS:   Please see the history of present illness.   (+) Diarrhea (+) Exertional shortness of breath Additional pertinent ROS otherwise unremarkable.  EKGs/Labs/Other Studies Reviewed:    The following studies were reviewed today:  CT Chest 06/04/2021: COMPARISON:  04/19/2021   FINDINGS: Cardiovascular: The heart size is normal. No substantial pericardial effusion. Coronary artery calcification is evident. Mild atherosclerotic calcification is noted in the wall of the thoracic aorta.   Mediastinum/Nodes: No mediastinal lymphadenopathy. No evidence for gross hilar lymphadenopathy although assessment is limited by the lack of intravenous contrast on the current study. The esophagus has normal imaging features. There is  no axillary lymphadenopathy.   Lungs/Pleura: Centrilobular emphsyema noted. There is some persistent subtle ground-glass opacity in the peripheral right upper lobe posteriorly with associated nodular components of more consolidative involvement. 1 of the dominant nodular component seen in the right upper lobe on image 107/3 previously (measuring about 9 mm when remeasured) has resolved in the interval with only some trace ground-glass opacity at this location today. Similar improvement in other areas of nodular involvement.   No new suspicious pulmonary nodule or mass. No focal airspace consolidation. No pleural effusion.   Upper Abdomen: Tiny hypodensity lateral segment left liver is stable, too small to characterize but likely benign. Possible tiny hypoattenuating lesion in the tail of pancreas with punctate wall calcification incompletely visualized on image 387/series 4.   Musculoskeletal: No worrisome lytic or sclerotic osseous abnormality.   IMPRESSION: 1. Overall improvement in the relatively large region of ground-glass opacity in the peripheral right upper  lobe. The associated nodular consolidative components are improved in the interval. 2. Possible tiny hypoattenuating lesion in the tail of pancreas with punctate wall calcification. This region was not included on any of the prior chest CTs. Consider abdominal MRI with and without contrast for definitive characterization. 3. Aortic Atherosclerosis (ICD10-I70.0) and Emphysema (ICD10-J43.9).  Echo 08/18/20 1. Left ventricular ejection fraction, by estimation, is 50 to 55%. The  left ventricle has low normal function. The left ventricle has no regional  wall motion abnormalities. Left ventricular diastolic parameters are  consistent with Grade I diastolic  dysfunction (impaired relaxation).   2. Right ventricular systolic function is normal. The right ventricular  size is mildly enlarged.   3. The mitral valve is  normal in structure. Trivial mitral valve  regurgitation. No evidence of mitral stenosis.   4. The aortic valve is tricuspid. Aortic valve regurgitation is not  visualized. No aortic stenosis is present.   5. The inferior vena cava is normal in size with greater than 50%  respiratory variability, suggesting right atrial pressure of 3 mmHg.  Stress test 08/14/20 Nuclear stress EF: 51%. The left ventricular ejection fraction is mildly decreased (45-54%). There was no ST segment deviation noted during stress. Findings consistent with prior myocardial infarction. This is a low risk study.   Small inferior basal wall infarct no ischemia EF 51%   Echo 02/19/19  1. The left ventricle has mild-moderately reduced systolic function, with an ejection fraction of 40-45%. The cavity size was normal. Left ventricular diastolic Doppler parameters are consistent with impaired relaxation. Left ventricular diffuse  hypokinesis, with slightly worse function in the lateral wall.  2. The right ventricle has normal systolic function. The cavity was normal. There is no increase in right ventricular wall thickness. Right ventricular systolic pressure could not be assessed.  3. The aortic valve is tricuspid. Aortic valve regurgitation is trivial by color flow Doppler. No stenosis of the aortic valve.  4. Cannot exclude small PFO by color flow Doppler.  5. There is redundancy of the interatrial septum.  EKG:  EKG was personally reviewed today 03/28/2022:  EKG was not ordered. 12/23/21: not ordered 06/29/21: NSR, RBBB at 87 bpm 07/02/20: NSR, RBBB at 77 bpm  Recent Labs: 06/23/2021: Pro B Natriuretic peptide (BNP) 41.0 01/17/2022: ALT 20; BUN 27; Creatinine, Ser 1.26; Hemoglobin 12.3; Platelets 335.0; Potassium 4.5; Sodium 136   Recent Lipid Panel    Component Value Date/Time   CHOL 128 06/23/2021 0806   CHOL 120 12/24/2019 1605   TRIG 262.0 (H) 06/23/2021 0806   HDL 48.20 06/23/2021 0806   HDL 48  12/24/2019 1605   CHOLHDL 3 06/23/2021 0806   VLDL 52.4 (H) 06/23/2021 0806   LDLCALC 31 12/24/2019 1605   LDLDIRECT 50.0 06/23/2021 0806    Physical Exam:    VS:  BP 126/74   Pulse 77   Ht _0  (1.676 m)   Wt 171 lb 9.6 oz (77.8 kg)   BMI 27.70 kg/m     Wt Readings from Last 3 Encounters:  03/28/22 171 lb 9.6 oz (77.8 kg)  01/17/22 173 lb 3.2 oz (78.6 kg)  12/23/21 176 lb 14.4 oz (80.2 kg)   GEN: Well nourished, well developed in no acute distress HEENT: Normal, moist mucous membranes NECK: No JVD CARDIAC: regular rhythm, normal S1 and S2, no rubs or gallops. No murmur. VASCULAR: Radial and DP pulses 2+ bilaterally. No carotid bruits RESPIRATORY:  Clear to auscultation without rales, wheezing or rhonchi  ABDOMEN:  Soft, non-tender, non-distended MUSCULOSKELETAL:  Ambulates independently SKIN: Warm and dry, no edema in lower extremities.  NEUROLOGIC:  Alert and oriented x 3. No focal neuro deficits noted. PSYCHIATRIC:  Normal affect    ASSESSMENT:    1. Coronary artery disease of native artery of native heart with stable angina pectoris ([place])   2. Chronic combined systolic and diastolic CHF (congestive heart failure) ([place])   3. Ischemic cardiomyopathy   4. Statin intolerance   5. Mixed hyperlipidemia   6. Type 2 diabetes mellitus with other circulatory complication, with long-term current use of insulin (HCC)      PLAN:    Nocturnal chest pain: improved with changing diet. 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:   -NYHA class II, EF improved to 50-55% on 08/2020 echo (was 40-45%) -tolerating maximum dose entresto, continue -tolerating metoprolol succinate 50 mg daily for stable angina -did not tolerate spironolactone per report -did not tolerate jardiance; tried farxiga but caused lethargy   CAD (predominantly RCA, but diffuse mild disease), with stable angina: -tolerating  metoprolol succinate 50 mg daily for stable angina -on aspirin 81 mg -avoid long term NSAIDs given CAD -lipids as below -discussed red flag warning signs that need immediate medical attention, such as chest tightness that does not resolve with rest  Mixed hyperlipidemia, statin intolerance: -LDL goal <70 -doing well on repatha, last LDL 50 06/23/21 -could not afford vascepa at >$500 -last TG 262, but focusing on lifestyle/diabetes control. Continue to monitor   Type II diabetes, on insulin -has neuropathy and vascular disease as  diabetic complication -last S9H 8.1 01/17/22 -has had some weight gain on glargine -see above, has not tolerated Jardiance or [place]  Cardiac risk counseling and prevention recommendations: -recommend heart healthy/Mediterranean diet, with whole grains, fruits, vegetable, fish, lean meats, nuts, and olive oil. Limit salt. -recommend moderate walking, 3-5 times/week for 30-50 minutes each session. Aim for at least 150 minutes.week. Goal should be pace of 3 miles/hours, or walking 1.5 miles in 30 minutes -recommend avoidance of tobacco products. Avoid excess alcohol.  Plan for follow up: 6 months or sooner as needed  Buford Dresser, MD, PhD, [facility]   Medication Adjustments/Labs and Tests Ordered: Current medicines are reviewed at length with the patient today.  Concerns regarding medicines are outlined above.   No orders of the defined types were placed in this encounter.  No orders of the defined types were placed in this encounter.  Patient Instructions  Medication Instructions:  Your Physician recommend you continue on your current medication as directed.    *If you need a refill on your cardiac medications before your next appointment, please call your pharmacy*   Lab Work: None ordered today   Testing/Procedures: None ordered today   Follow-Up: At [facility], you and your health needs are our  priority.  As part of our continuing mission to provide you with exceptional heart care, we have created designated Provider Care Teams.  These Care Teams include your primary Cardiologist (physician) and Advanced Practice Providers (APPs -  Physician Assistants and Nurse Practitioners) who all work together to provide you with the care you need, when you need it.  We recommend signing up for the patient portal called "MyChart".  Sign up information is provided on this After Visit Summary.  MyChart is used to connect with patients for Virtual Visits (Telemedicine).  Patients are able to view lab/test results,  encounter notes, upcoming appointments, etc.  Non-urgent messages can be sent to your provider as well.   To learn more about what you can do with MyChart, go to [email or web address].    Your next appointment:   6 month(s)  The format for your next appointment:   In Person  Provider:   Buford Dresser, MD{        I,Mathew Stumpf,acting as a scribe for Buford Dresser, MD.,have documented all relevant documentation on the behalf of Buford Dresser, MD,as directed by  Buford Dresser, MD while in the presence of Buford Dresser, MD.  I, Buford Dresser, MD, have reviewed all documentation for this visit. The documentation on 03/28/22 for the exam, diagnosis, procedures, and orders are all accurate and complete.   Signed, Buford Dresser, MD PhD 03/28/2022    [place]

## 2022-03-28 NOTE — Patient Instructions (Signed)

## 2022-04-22 ENCOUNTER — Other Ambulatory Visit: Payer: Self-pay | Admitting: Family Medicine

## 2022-04-24 ENCOUNTER — Other Ambulatory Visit: Payer: Self-pay | Admitting: Cardiology

## 2022-04-25 NOTE — Telephone Encounter (Signed)
Rx request sent to pharmacy.  

## 2022-05-22 ENCOUNTER — Other Ambulatory Visit: Payer: Self-pay | Admitting: Family Medicine

## 2022-05-24 ENCOUNTER — Telehealth: Payer: Self-pay | Admitting: Pharmacist

## 2022-05-24 NOTE — Progress Notes (Signed)
Chronic Care Management Pharmacy Assistant   Name: Casey Lynch  MRN: [id number] DOB: Jul 06, 1946   Reason for Encounter: Diabetes Adherence Call    Recent office visits:  None  Recent consult visits:  03/28/2022 [facility] (Cardiology) Buford Dresser, MD; no medication changes indicated.  Hospital visits:  None in previous 6 months  Medications: Outpatient Encounter Medications as of 05/24/2022  Medication Sig   aspirin EC 81 MG tablet Take 81 mg by mouth daily. Swallow whole.   B Complex Vitamins (VITAMIN B COMPLEX) TABS Take by mouth.   Cholecalciferol (VITAMIN D3 PO) Take 1 capsule by mouth daily.   colchicine 0.6 MG tablet Take 1 tablet (0.6 mg total) by mouth daily.   Continuous Blood Gluc Sensor (FREESTYLE LIBRE 3 SENSOR) MISC Quantity: 2 sensors/month NDC# [phone]. [id number]   CONTOUR NEXT TEST test strip    ENTRESTO 97-103 MG TAKE 1 TABLET BY MOUTH TWICE DAILY   Evolocumab (REPATHA SURECLICK) 416 MG/ML SOAJ ADMINISTER 1 ML(140 MG) UNDER THE SKIN EVERY 14 DAYS   furosemide (LASIX) 20 MG tablet Take 1 tablet (20 mg total) by mouth daily as needed for edema.   gabapentin (NEURONTIN) 100 MG capsule TAKE 1 CAPSULE(100 MG) BY MOUTH AT BEDTIME AS NEEDED   hydroxychloroquine (PLAQUENIL) 200 MG tablet Take 200 mg by mouth 2 (two) times daily.   Insulin Glargine (BASAGLAR KWIKPEN) 100 UNIT/ML Inject 10-20 Units into the skin daily. Please provide pen needles   Insulin Pen Needle (B-D ULTRAFINE III SHORT PEN) 31G X 8 MM MISC USE AS DIRECTED TO INJECT BASAGLAR EVERY DAY   metFORMIN (GLUCOPHAGE-XR) 500 MG 24 hr tablet TAKE 2 TABLETS BY MOUTH TWICE DAILY WITH MEALS   metoprolol succinate (TOPROL-XL) 50 MG 24 hr tablet Take 1 tablet (50 mg total) by mouth daily.   nitroGLYCERIN (NITROSTAT) 0.4 MG SL tablet Place 1 tablet (0.4 mg total) under the tongue every 5 (five) minutes as needed for chest pain.   triamcinolone (KENALOG) 0.1 % 1 application to affected area   No  facility-administered encounter medications on file as of 05/24/2022.   Recent Relevant Labs: Lab Results  Component Value Date/Time   HGBA1C 8.1 (H) 01/17/2022 04:36 PM   HGBA1C 9.1 (H) 10/14/2021 10:04 AM   MICROALBUR <0.7 10/01/2020 09:53 AM    Kidney Function Lab Results  Component Value Date/Time   CREATININE 1.26 01/17/2022 04:36 PM   CREATININE 1.10 10/14/2021 10:04 AM   GFR 55.31 (L) 01/17/2022 04:36 PM   GFRNONAA 58 (L) 12/24/2019 04:05 PM   GFRAA 67 12/24/2019 04:05 PM    Current antihyperglycemic regimen:  Basaglar 19 units daily Metformin 500 mg two tablets twice daily  What recent interventions/DTPs have been made to improve glycemic control:  No recent interventions or DTPs  Have there been any recent hospitalizations or ED visits since last visit with CPP? No  Patient denies hypoglycemic symptoms.  Patient denies hyperglycemic symptoms.  How often are you checking your blood sugar? once daily  What are your blood sugars ranging?  Fasting: 90-1115  During the week, how often does your blood glucose drop below 70? Once a month  Are you checking your feet daily/regularly? Yes  Adherence Review: Is the patient currently on a STATIN medication? No Is the patient currently on ACE/ARB medication? Yes Does the patient have >5 day gap between last estimated fill dates? No  Care Gaps: Medicare Annual Wellness: Completed 08/16/2021  Ophthalmology Exam: Next due on 11/23/2022 Foot Exam: Next due on  06/21/2022 Hemoglobin A1C: 8.1% on 01/17/2022 Colonoscopy: Completed 10/24/2016  Future Appointments  Date Time Provider [place]  06/06/2022  9:30 AM [facility] 1 [facility]  07/04/2022  2:00 PM Marin Olp, MD [facility]  08/25/2022 11:45 AM [facility] HEALTH COACH [facility]   Star Rating Drugs: Basaglar last filled 04/28/2022 68 DS Metformin 500 mg last filled 05/23/2022 90 DS Entresto 97-103 mg last filled 04/26/2022 90 DS  April D Calhoun,  [place] Pharmacist Assistant [phone]

## 2022-05-26 DIAGNOSIS — H353131 Nonexudative age-related macular degeneration, bilateral, early dry stage: Secondary | ICD-10-CM | POA: Diagnosis not present

## 2022-05-26 DIAGNOSIS — M069 Rheumatoid arthritis, unspecified: Secondary | ICD-10-CM | POA: Diagnosis not present

## 2022-05-26 DIAGNOSIS — E113293 Type 2 diabetes mellitus with mild nonproliferative diabetic retinopathy without macular edema, bilateral: Secondary | ICD-10-CM | POA: Diagnosis not present

## 2022-05-26 DIAGNOSIS — Z79899 Other long term (current) drug therapy: Secondary | ICD-10-CM | POA: Diagnosis not present

## 2022-05-26 LAB — HM DIABETES EYE EXAM

## 2022-05-30 ENCOUNTER — Encounter: Payer: Self-pay | Admitting: *Deleted

## 2022-06-01 ENCOUNTER — Ambulatory Visit (HOSPITAL_BASED_OUTPATIENT_CLINIC_OR_DEPARTMENT_OTHER): Payer: Medicare Other | Admitting: Cardiology

## 2022-06-01 ENCOUNTER — Encounter (HOSPITAL_BASED_OUTPATIENT_CLINIC_OR_DEPARTMENT_OTHER): Payer: Self-pay | Admitting: Cardiology

## 2022-06-01 VITALS — BP 122/66 | HR 74 | Ht 66.0 in | Wt 173.0 lb

## 2022-06-01 DIAGNOSIS — E782 Mixed hyperlipidemia: Secondary | ICD-10-CM

## 2022-06-01 DIAGNOSIS — I2510 Atherosclerotic heart disease of native coronary artery without angina pectoris: Secondary | ICD-10-CM

## 2022-06-01 DIAGNOSIS — R0609 Other forms of dyspnea: Secondary | ICD-10-CM | POA: Diagnosis not present

## 2022-06-01 DIAGNOSIS — Z794 Long term (current) use of insulin: Secondary | ICD-10-CM

## 2022-06-01 DIAGNOSIS — E1159 Type 2 diabetes mellitus with other circulatory complications: Secondary | ICD-10-CM

## 2022-06-01 DIAGNOSIS — I25118 Atherosclerotic heart disease of native coronary artery with other forms of angina pectoris: Secondary | ICD-10-CM

## 2022-06-01 DIAGNOSIS — I255 Ischemic cardiomyopathy: Secondary | ICD-10-CM | POA: Diagnosis not present

## 2022-06-01 DIAGNOSIS — I5042 Chronic combined systolic (congestive) and diastolic (congestive) heart failure: Secondary | ICD-10-CM

## 2022-06-01 DIAGNOSIS — Z789 Other specified health status: Secondary | ICD-10-CM

## 2022-06-01 NOTE — Patient Instructions (Addendum)
Medication Instructions:  Try the following: -try taking a nitro before walking hills. If this helps, then let me know and we can try a low dose of a long acting nitro medication -if nitro doesn't help, we could consider exercise induced vasospasm  If we can't figure this out, let me know and we will set you up for a cardiopulmonary exercise test (CPX)  *If you need a refill on your cardiac medications before your next appointment, please call your pharmacy*   Lab Work: None ordered today   Testing/Procedures: None ordered today   Follow-Up: At [facility], you and your health needs are our priority.  As part of our continuing mission to provide you with exceptional heart care, we have created designated Provider Care Teams.  These Care Teams include your primary Cardiologist (physician) and Advanced Practice Providers (APPs -  Physician Assistants and Nurse Practitioners) who all work together to provide you with the care you need, when you need it.  We recommend signing up for the patient portal called "MyChart".  Sign up information is provided on this After Visit Summary.  MyChart is used to connect with patients for Virtual Visits (Telemedicine).  Patients are able to view lab/test results, encounter notes, upcoming appointments, etc.  Non-urgent messages can be sent to your provider as well.   To learn more about what you can do with MyChart, go to [email or web address].    Your next appointment:   4 month(s)  The format for your next appointment:   In Person  Provider:   Buford Dresser, MD

## 2022-06-01 NOTE — Progress Notes (Signed)
Cardiology Office Note:    Date:  06/01/2022   ID:  Casey Lynch, DOB [birth year], MRN [id number]  PCP:  Marin Olp, MD  Cardiologist:  Buford Dresser, MD  Referring MD: Marin Olp, MD   CC: follow up  History of Present Illness:    Casey Lynch is a 76 y.o. male with a hx of with CAD, chronic systolic and diastolic heart failure, ischemic cardiomyopathy, hyperlipidemia, PAD (carotid bruit <50% bilaterally, left subclavian steal syndrome), COPD, former tobacco abuse, OSA on CPAP, type II diabetes, here for follow up. I met him 07/11/2018 and his initial cardiac evaluation from [place] is summarized in that note.  Today:  He is accompanied by his wife today. He appears to be doing well. His bilateral LE neuropathy was discussed at length.  Recently, on a trip with friends, he reports that he could not walk up hills due to shortness of breath. He states that sometimes he'll be playing golf, walk up a hill to the green, and he has to stop to catch his breath before putting.  Additionally, he mentions some occasional bilateral hand edema.   Of note, he complains of frequent trips to the bathroom involving explosive diarrhea. They believe it is due to his medications.  For exercise, he still walks every day and goes golfing frequently.  He denies any palpitations or chest pain. No lightheadedness, headaches, syncope, orthopnea, or PND.  Past Medical History:  Diagnosis Date   Carotid bruit    Diabetes mellitus without complication (HCC)    Eczema    Heart failure ([place])    History of colonic polyps    Hypercholesterolemia    Hyperlipidemia    Macular degeneration    Osteoarthritis    Paresthesias    Pseudogout    Subclavian steal syndrome     Past Surgical History:  Procedure Laterality Date   CARDIAC CATHETERIZATION     COLONOSCOPY WITH PROPOFOL N/A 10/24/2016   Procedure: COLONOSCOPY WITH PROPOFOL;  Surgeon: Garlan Fair, MD;  Location: [facility]  ENDOSCOPY;  Service: Endoscopy;  Laterality: N/A;   HYDROCELE EXCISION / REPAIR     KNEE SURGERY     REFRACTIVE SURGERY     TONSILLECTOMY      Current Medications: Current Outpatient Medications on File Prior to Visit  Medication Sig   aspirin EC 81 MG tablet Take 81 mg by mouth daily. Swallow whole.   B Complex Vitamins (VITAMIN B COMPLEX) TABS Take by mouth.   Cholecalciferol (VITAMIN D3 PO) Take 1 capsule by mouth daily.   colchicine 0.6 MG tablet Take 1 tablet (0.6 mg total) by mouth daily.   Continuous Blood Gluc Sensor (FREESTYLE LIBRE 3 SENSOR) MISC Quantity: 2 sensors/month NDC# [phone]. [id number]   CONTOUR NEXT TEST test strip    ENTRESTO 97-103 MG TAKE 1 TABLET BY MOUTH TWICE DAILY   Evolocumab (REPATHA SURECLICK) 882 MG/ML SOAJ ADMINISTER 1 ML(140 MG) UNDER THE SKIN EVERY 14 DAYS   furosemide (LASIX) 20 MG tablet Take 1 tablet (20 mg total) by mouth daily as needed for edema.   gabapentin (NEURONTIN) 100 MG capsule TAKE 1 CAPSULE(100 MG) BY MOUTH AT BEDTIME AS NEEDED   hydroxychloroquine (PLAQUENIL) 200 MG tablet Take 200 mg by mouth 2 (two) times daily.   Insulin Glargine (BASAGLAR KWIKPEN) 100 UNIT/ML Inject 10-20 Units into the skin daily. Please provide pen needles   Insulin Pen Needle (B-D ULTRAFINE III SHORT PEN) 31G X 8 MM MISC USE AS DIRECTED TO  INJECT BASAGLAR EVERY DAY   metFORMIN (GLUCOPHAGE-XR) 500 MG 24 hr tablet TAKE 2 TABLETS BY MOUTH TWICE DAILY WITH MEALS   metoprolol succinate (TOPROL-XL) 50 MG 24 hr tablet Take 1 tablet (50 mg total) by mouth daily.   nitroGLYCERIN (NITROSTAT) 0.4 MG SL tablet Place 1 tablet (0.4 mg total) under the tongue every 5 (five) minutes as needed for chest pain.   triamcinolone (KENALOG) 0.1 % 1 application to affected area   No current facility-administered medications on file prior to visit.     Allergies:   Jardiance [empagliflozin], Statins, and Tetanus toxoids   Social History   Tobacco Use   Smoking status: Former     Packs/day: 1.00    Years: 59.00    Total pack years: 59.00    Types: Cigarettes    Quit date: 09/05/2016    Years since quitting: 5.7   Smokeless tobacco: Never  Vaping Use   Vaping Use: Never used  Substance Use Topics   Alcohol use: Yes    Alcohol/week: 0.0 standard drinks of alcohol    Comment: Rare   Drug use: No    Family History: family history includes Bipolar disorder in his brother; Cancer in his paternal grandmother; Diabetes in his brother; Heart failure in his father.  ROS:   Please see the history of present illness.   (+) Exertional shortness of breath (+) Bilateral hand edema (+) Explosive diarrhea  Additional pertinent ROS otherwise unremarkable.  EKGs/Labs/Other Studies Reviewed:    The following studies were reviewed today:  CT Chest 06/04/2021: COMPARISON:  04/19/2021   FINDINGS: Cardiovascular: The heart size is normal. No substantial pericardial effusion. Coronary artery calcification is evident. Mild atherosclerotic calcification is noted in the wall of the thoracic aorta.   Mediastinum/Nodes: No mediastinal lymphadenopathy. No evidence for gross hilar lymphadenopathy although assessment is limited by the lack of intravenous contrast on the current study. The esophagus has normal imaging features. There is no axillary lymphadenopathy.   Lungs/Pleura: Centrilobular emphsyema noted. There is some persistent subtle ground-glass opacity in the peripheral right upper lobe posteriorly with associated nodular components of more consolidative involvement. 1 of the dominant nodular component seen in the right upper lobe on image 107/3 previously (measuring about 9 mm when remeasured) has resolved in the interval with only some trace ground-glass opacity at this location today. Similar improvement in other areas of nodular involvement.   No new suspicious pulmonary nodule or mass. No focal airspace consolidation. No pleural effusion.   Upper  Abdomen: Tiny hypodensity lateral segment left liver is stable, too small to characterize but likely benign. Possible tiny hypoattenuating lesion in the tail of pancreas with punctate wall calcification incompletely visualized on image 387/series 4.   Musculoskeletal: No worrisome lytic or sclerotic osseous abnormality.   IMPRESSION: 1. Overall improvement in the relatively large region of ground-glass opacity in the peripheral right upper lobe. The associated nodular consolidative components are improved in the interval. 2. Possible tiny hypoattenuating lesion in the tail of pancreas with punctate wall calcification. This region was not included on any of the prior chest CTs. Consider abdominal MRI with and without contrast for definitive characterization. 3. Aortic Atherosclerosis (ICD10-I70.0) and Emphysema (ICD10-J43.9).  Echo 08/18/20 1. Left ventricular ejection fraction, by estimation, is 50 to 55%. The  left ventricle has low normal function. The left ventricle has no regional  wall motion abnormalities. Left ventricular diastolic parameters are  consistent with Grade I diastolic  dysfunction (impaired relaxation).   2. Right  ventricular systolic function is normal. The right ventricular  size is mildly enlarged.   3. The mitral valve is normal in structure. Trivial mitral valve  regurgitation. No evidence of mitral stenosis.   4. The aortic valve is tricuspid. Aortic valve regurgitation is not  visualized. No aortic stenosis is present.   5. The inferior vena cava is normal in size with greater than 50%  respiratory variability, suggesting right atrial pressure of 3 mmHg.  Stress test 08/14/20 Nuclear stress EF: 51%. The left ventricular ejection fraction is mildly decreased (45-54%). There was no ST segment deviation noted during stress. Findings consistent with prior myocardial infarction. This is a low risk study.   Small inferior basal wall infarct no ischemia EF  51%   Echo 02/19/19  1. The left ventricle has mild-moderately reduced systolic function, with an ejection fraction of 40-45%. The cavity size was normal. Left ventricular diastolic Doppler parameters are consistent with impaired relaxation. Left ventricular diffuse  hypokinesis, with slightly worse function in the lateral wall.  2. The right ventricle has normal systolic function. The cavity was normal. There is no increase in right ventricular wall thickness. Right ventricular systolic pressure could not be assessed.  3. The aortic valve is tricuspid. Aortic valve regurgitation is trivial by color flow Doppler. No stenosis of the aortic valve.  4. Cannot exclude small PFO by color flow Doppler.  5. There is redundancy of the interatrial septum.  EKG:  EKG was personally reviewed today 06/01/22: NSR at 74 bpm, RBBB 03/28/2022:  EKG was not ordered. 12/23/21: not ordered 06/29/21: NSR, RBBB at 87 bpm 07/02/20: NSR, RBBB at 77 bpm  Recent Labs: 06/23/2021: Pro B Natriuretic peptide (BNP) 41.0 01/17/2022: ALT 20; BUN 27; Creatinine, Ser 1.26; Hemoglobin 12.3; Platelets 335.0; Potassium 4.5; Sodium 136   Recent Lipid Panel    Component Value Date/Time   CHOL 128 06/23/2021 0806   CHOL 120 12/24/2019 1605   TRIG 262.0 (H) 06/23/2021 0806   HDL 48.20 06/23/2021 0806   HDL 48 12/24/2019 1605   CHOLHDL 3 06/23/2021 0806   VLDL 52.4 (H) 06/23/2021 0806   LDLCALC 31 12/24/2019 1605   LDLDIRECT 50.0 06/23/2021 0806    Physical Exam:    VS:  BP 122/66   Pulse 74   Ht 5' 6"  (1.676 m)   Wt 173 lb (78.5 kg)   BMI 27.92 kg/m     Wt Readings from Last 3 Encounters:  03/28/22 171 lb 9.6 oz (77.8 kg)  01/17/22 173 lb 3.2 oz (78.6 kg)  12/23/21 176 lb 14.4 oz (80.2 kg)   GEN: Well nourished, well developed in no acute distress HEENT: Normal, moist mucous membranes NECK: No JVD CARDIAC: regular rhythm, normal S1 and S2, no rubs or gallops. No murmur. VASCULAR: Radial and DP pulses 2+  bilaterally. No carotid bruits RESPIRATORY:  Clear to auscultation without rales, wheezing or rhonchi  ABDOMEN: Soft, non-tender, non-distended MUSCULOSKELETAL:  Ambulates independently SKIN: Warm and dry, no LE edema, trivial bilateral hand edema NEUROLOGIC:  Alert and oriented x 3. No focal neuro deficits noted. PSYCHIATRIC:  Normal affect    ASSESSMENT:    1. Dyspnea on exertion   2. Coronary artery disease of native artery of native heart with stable angina pectoris ([place])   3. Chronic combined systolic and diastolic CHF (congestive heart failure) ([place])   4. Ischemic cardiomyopathy   5. Mixed hyperlipidemia   6. Statin intolerance   7. Type 2 diabetes mellitus with other circulatory complication,  with long-term current use of insulin (HCC)     PLAN:    Dyspnea on exertion: -may be angina. He will try taking nitro prior to walking to see if this improves his symptoms. 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:   -NYHA class II, EF improved to 50-55% on 08/2020 echo (was 40-45%) -tolerating maximum dose entresto, continue -tolerating metoprolol succinate 50 mg daily for stable angina -did not tolerate spironolactone per report -did not tolerate jardiance; tried farxiga but caused lethargy   CAD (predominantly RCA, but diffuse mild disease), with stable angina: -tolerating metoprolol succinate 50 mg daily for stable angina -on aspirin 81 mg -avoid long term NSAIDs given CAD -lipids as below -discussed red flag warning signs that need immediate medical attention, such as chest tightness that does not resolve with rest  Mixed hyperlipidemia, statin intolerance: -LDL goal <70 -doing well on repatha, last LDL 45 -could not afford vascepa at >$500 -last TG 262, but focusing on lifestyle/diabetes control. Continue to monitor   Type II diabetes, on insulin -has  neuropathy and vascular disease as  diabetic complication -last V4M 8.1 -has had some weight gain on glargine -see above, has not tolerated Jardiance or [place]  Cardiac risk counseling and prevention recommendations: -recommend heart healthy/Mediterranean diet, with whole grains, fruits, vegetable, fish, lean meats, nuts, and olive oil. Limit salt. -recommend moderate walking, 3-5 times/week for 30-50 minutes each session. Aim for at least 150 minutes.week. Goal should be pace of 3 miles/hours, or walking 1.5 miles in 30 minutes -recommend avoidance of tobacco products. Avoid excess alcohol.  Plan for follow up: 4 months.   Buford Dresser, MD, PhD, [facility]   Medication Adjustments/Labs and Tests Ordered: Current medicines are reviewed at length with the patient today.  Concerns regarding medicines are outlined above.   Orders Placed This Encounter  Procedures   EKG 12-Lead   No orders of the defined types were placed in this encounter.  Patient Instructions  Medication Instructions:  Try the following: -try taking a nitro before walking hills. If this helps, then let me know and we can try a low dose of a long acting nitro medication -if nitro doesn't help, we could consider exercise induced vasospasm  If we can't figure this out, let me know and we will set you up for a cardiopulmonary exercise test (CPX)  *If you need a refill on your cardiac medications before your next appointment, please call your pharmacy*   Lab Work: None ordered today   Testing/Procedures: None ordered today   Follow-Up: At [facility], you and your health needs are our priority.  As part of our continuing mission to provide you with exceptional heart care, we have created designated Provider Care Teams.  These Care Teams include your primary Cardiologist (physician) and Advanced Practice Providers (APPs -  Physician Assistants and Nurse Practitioners)  who all work together to provide you with the care you need, when you need it.  We recommend signing up for the patient portal called "MyChart".  Sign up information is provided on this After Visit Summary.  MyChart is used to connect with patients for Virtual Visits (Telemedicine).  Patients are able to view lab/test results, encounter notes, upcoming appointments, etc.  Non-urgent messages can be sent to your provider as well.   To learn more about what you can do with MyChart, go to  [email or web address].    Your next appointment:   4 month(s)  The format for your next appointment:   In Person  Provider:   Buford Dresser, MD              I,Breanna Adamick,acting as a scribe for Buford Dresser, MD.,have documented all relevant documentation on the behalf of Buford Dresser, MD,as directed by  Buford Dresser, MD while in the presence of Buford Dresser, MD.  I, Buford Dresser, MD, have reviewed all documentation for this visit. The documentation on 06/01/22 for the exam, diagnosis, procedures, and orders are all accurate and complete.   Signed, Buford Dresser, MD PhD 06/01/2022    [place]

## 2022-06-06 ENCOUNTER — Ambulatory Visit (HOSPITAL_BASED_OUTPATIENT_CLINIC_OR_DEPARTMENT_OTHER)
Admission: RE | Admit: 2022-06-06 | Discharge: 2022-06-06 | Disposition: A | Discharge status: Home / Self Care | Payer: Medicare Other | Source: Ambulatory Visit | Attending: Family Medicine | Admitting: Family Medicine

## 2022-06-06 DIAGNOSIS — Z87891 Personal history of nicotine dependence: Secondary | ICD-10-CM | POA: Diagnosis not present

## 2022-06-07 ENCOUNTER — Encounter: Payer: Self-pay | Admitting: Physician Assistant

## 2022-06-07 ENCOUNTER — Encounter: Payer: Self-pay | Admitting: Family Medicine

## 2022-06-07 ENCOUNTER — Ambulatory Visit (INDEPENDENT_AMBULATORY_CARE_PROVIDER_SITE_OTHER): Payer: Medicare Other | Admitting: Physician Assistant

## 2022-06-07 VITALS — BP 110/60 | HR 73 | Temp 97.5°F | Ht 66.0 in | Wt 171.0 lb

## 2022-06-07 DIAGNOSIS — Z23 Encounter for immunization: Secondary | ICD-10-CM | POA: Diagnosis not present

## 2022-06-07 DIAGNOSIS — G629 Polyneuropathy, unspecified: Secondary | ICD-10-CM

## 2022-06-07 DIAGNOSIS — M720 Palmar fascial fibromatosis [Dupuytren]: Secondary | ICD-10-CM

## 2022-06-07 NOTE — Progress Notes (Signed)
Casey Lynch is a 76 y.o. male here for a new problem.  History of Present Illness:   Chief Complaint  Patient presents with   Cyst    Pt c/o having hard knots on palms of both hands x several days.    HPI  Patient is here with his wife.    Bumps on hand He reports that he recently noticed bumps on both of his hands. He has 3 bumps on each of his hands. He just noticed this a few days ago. They are not getting worse. Denies difficulty extending fingers or pain. Has not tried anything for his symptoms. He is currently golfing about 2-3 days per week but denies any other excessive activity with his hands.   Neuropathy He reports that his leg pain will cause him to wake up from sleep, but has been doing better with 100 mg gabapentin and THC gummies. He denies any leg swelling. Denies any need for further intervention at this time. Does note that sometimes his feet get cold but his wife, former Therapist, sports, checks his pedal pulses regularly without concern.   Immunization He is interested in receiving the influenza vaccine during today's visit.     Past Medical History:  Diagnosis Date   Carotid bruit    Diabetes mellitus without complication (HCC)    Eczema    Heart failure (HCC)    History of colonic polyps    Hypercholesterolemia    Hyperlipidemia    Macular degeneration    Osteoarthritis    Paresthesias    Pseudogout    Subclavian steal syndrome      Social History   Tobacco Use   Smoking status: Former    Packs/day: 1.00    Years: 59.00    Total pack years: 59.00    Types: Cigarettes    Quit date: 09/05/2016    Years since quitting: 5.7   Smokeless tobacco: Never  Vaping Use   Vaping Use: Never used  Substance Use Topics   Alcohol use: Yes    Alcohol/week: 0.0 standard drinks of alcohol    Comment: Rare   Drug use: No    Past Surgical History:  Procedure Laterality Date   CARDIAC CATHETERIZATION     COLONOSCOPY WITH PROPOFOL N/A 10/24/2016   Procedure:  COLONOSCOPY WITH PROPOFOL;  Surgeon: Garlan Fair, MD;  Location: [facility] ENDOSCOPY;  Service: Endoscopy;  Laterality: N/A;   HYDROCELE EXCISION / REPAIR     KNEE SURGERY     REFRACTIVE SURGERY     TONSILLECTOMY      Family History  Problem Relation Age of Onset   Heart failure Father    Bipolar disorder Brother    Diabetes Brother    Cancer Paternal Grandmother     Allergies  Allergen Reactions   Jardiance [Empagliflozin] Other (See Comments)    Pain and stiffness   Statins Other (See Comments)    Terrible joint pain   Tetanus Toxoids Other (See Comments)    Fever 104/105 & arm stiffiness    Current Medications:   Current Outpatient Medications:    aspirin EC 81 MG tablet, Take 81 mg by mouth daily. Swallow whole., Disp: , Rfl:    B Complex Vitamins (VITAMIN B COMPLEX) TABS, Take by mouth., Disp: , Rfl:    Cholecalciferol (VITAMIN D3 PO), Take 1 capsule by mouth daily., Disp: , Rfl:    colchicine 0.6 MG tablet, Take 1 tablet (0.6 mg total) by mouth daily., Disp: 90 tablet, Rfl:  0   Continuous Blood Gluc Sensor (FREESTYLE LIBRE 3 SENSOR) MISC, Quantity: 2 sensors/month NDC# [phone]. [id number], Disp: 2 each, Rfl: 12   CONTOUR NEXT TEST test strip, , Disp: , Rfl:    ENTRESTO 97-103 MG, TAKE 1 TABLET BY MOUTH TWICE DAILY, Disp: 180 tablet, Rfl: 3   Evolocumab (REPATHA SURECLICK) 539 MG/ML SOAJ, ADMINISTER 1 ML(140 MG) UNDER THE SKIN EVERY 14 DAYS, Disp: 6 mL, Rfl: 3   furosemide (LASIX) 20 MG tablet, Take 1 tablet (20 mg total) by mouth daily as needed for edema., Disp: 90 tablet, Rfl: 3   gabapentin (NEURONTIN) 100 MG capsule, TAKE 1 CAPSULE(100 MG) BY MOUTH AT BEDTIME AS NEEDED, Disp: 30 capsule, Rfl: 5   hydroxychloroquine (PLAQUENIL) 200 MG tablet, Take 200 mg by mouth 2 (two) times daily., Disp: , Rfl:    Insulin Glargine (BASAGLAR KWIKPEN) 100 UNIT/ML, Inject 10-20 Units into the skin daily. Please provide pen needles, Disp: 15 mL, Rfl: 5   Insulin Pen Needle (B-D  ULTRAFINE III SHORT PEN) 31G X 8 MM MISC, USE AS DIRECTED TO INJECT BASAGLAR EVERY DAY, Disp: 100 each, Rfl: 3   metFORMIN (GLUCOPHAGE-XR) 500 MG 24 hr tablet, TAKE 2 TABLETS BY MOUTH TWICE DAILY WITH MEALS, Disp: 360 tablet, Rfl: 2   metoprolol succinate (TOPROL-XL) 50 MG 24 hr tablet, Take 1 tablet (50 mg total) by mouth daily., Disp: 90 tablet, Rfl: 3   triamcinolone (KENALOG) 0.1 %, 1 application to affected area, Disp: , Rfl:    nitroGLYCERIN (NITROSTAT) 0.4 MG SL tablet, Place 1 tablet (0.4 mg total) under the tongue every 5 (five) minutes as needed for chest pain., Disp: 90 tablet, Rfl: 3   Review of Systems:   Review of Systems  Musculoskeletal:        (+) bumps on both of his hands.    Vitals:   Vitals:   06/07/22 1128  BP: 110/60  Pulse: 73  Temp: (!) 97.5 F (36.4 C)  TempSrc: Temporal  SpO2: 96%  Weight: 171 lb (77.6 kg)  Height: '5\' 6"'$  (1.676 m)     Body mass index is 27.6 kg/m.  Physical Exam:   Physical Exam Vitals and nursing note reviewed.  Constitutional:      Appearance: He is well-developed.  HENT:     Head: Normocephalic.  Eyes:     Conjunctiva/sclera: Conjunctivae normal.     Pupils: Pupils are equal, round, and reactive to light.  Pulmonary:     Effort: Pulmonary effort is normal.  Musculoskeletal:        General: Normal range of motion.     Cervical back: Normal range of motion.  Skin:    General: Skin is warm and dry.     Comments: Bilateral hands with palpable, non-tender, nodules at palmar aspect of ring and index finger tendons  Neurological:     Mental Status: He is alert and oriented to person, place, and time.  Psychiatric:        Behavior: Behavior normal.        Thought Content: Thought content normal.        Judgment: Judgment normal.     Assessment and Plan:   Dupuytren contracture Patient was also examined by Dr Dimas Chyle Discussed cause and treatment of dupuytren contracture Patient is asymptomatic at this time May  refer to sports medicine if/when needed  Neuropathy Patient declines any need for change in intervention today Follow-up with PCP prn for management  Need for immunization against influenza  UTD   I,Param Shah,acting as a scribe for [employer], PA.,have documented all relevant documentation on the behalf of Inda Coke, PA,as directed by  Inda Coke, PA while in the presence of Inda Coke, [place].  I, Inda Coke, [place], have reviewed all documentation for this visit. The documentation on 06/07/22 for the exam, diagnosis, procedures, and orders are all accurate and complete.   Inda Coke, PA-C

## 2022-06-08 ENCOUNTER — Other Ambulatory Visit: Payer: Self-pay

## 2022-06-08 DIAGNOSIS — Z122 Encounter for screening for malignant neoplasm of respiratory organs: Secondary | ICD-10-CM

## 2022-06-08 DIAGNOSIS — Z87891 Personal history of nicotine dependence: Secondary | ICD-10-CM

## 2022-07-04 ENCOUNTER — Ambulatory Visit (INDEPENDENT_AMBULATORY_CARE_PROVIDER_SITE_OTHER): Payer: Medicare Other | Admitting: Family Medicine

## 2022-07-04 ENCOUNTER — Encounter: Payer: Self-pay | Admitting: Family Medicine

## 2022-07-04 ENCOUNTER — Encounter (HOSPITAL_BASED_OUTPATIENT_CLINIC_OR_DEPARTMENT_OTHER): Payer: Self-pay | Admitting: Cardiology

## 2022-07-04 VITALS — BP 104/62 | HR 75 | Temp 97.5°F | Ht 66.0 in | Wt 170.2 lb

## 2022-07-04 DIAGNOSIS — E1165 Type 2 diabetes mellitus with hyperglycemia: Secondary | ICD-10-CM | POA: Diagnosis not present

## 2022-07-04 DIAGNOSIS — G729 Myopathy, unspecified: Secondary | ICD-10-CM

## 2022-07-04 DIAGNOSIS — Z0000 Encounter for general adult medical examination without abnormal findings: Secondary | ICD-10-CM | POA: Diagnosis not present

## 2022-07-04 DIAGNOSIS — Z87891 Personal history of nicotine dependence: Secondary | ICD-10-CM | POA: Diagnosis not present

## 2022-07-04 DIAGNOSIS — Z23 Encounter for immunization: Secondary | ICD-10-CM | POA: Diagnosis not present

## 2022-07-04 MED ORDER — OZEMPIC (0.25 OR 0.5 MG/DOSE) 2 MG/3ML ~~LOC~~ SOPN: PEN_INJECTOR | SUBCUTANEOUS | 5 refills | Status: DC

## 2022-07-04 MED ORDER — GABAPENTIN 100 MG PO CAPS: 200.0000 mg | ORAL_CAPSULE | Freq: Every day | ORAL | 5 refills | Status: DC

## 2022-07-04 NOTE — Progress Notes (Signed)
Phone: [phone]   Subjective:  Patient presents today for their annual physical. Chief complaint-noted.   See problem oriented charting- ROS- full  review of systems was completed and negative  except for: drooling (chatted with dentist- was basically told better than the opposite), runny nose, shortness of breath (has talked with cardiology), diarrhea ongoing, joint pain, numbness in feet and pain, joint pain knees   The following were reviewed and entered/updated in epic: Past Medical History:  Diagnosis Date   Carotid bruit    Diabetes mellitus without complication ([place])    Eczema    Heart failure ([place])    History of colonic polyps    Hypercholesterolemia    Hyperlipidemia    Macular degeneration    Osteoarthritis    Paresthesias    Pseudogout    Subclavian steal syndrome    Patient Active Problem List   Diagnosis Date Noted   Poorly controlled diabetes mellitus ([place]) 10/01/2020    Priority: High   Ischemic cardiomyopathy 07/25/2019    Priority: High   Chronic combined systolic and diastolic heart failure ([place]) 07/12/2018    Priority: High   Coronary artery disease due to lipid rich plaque 07/12/2018    Priority: High   Psoriatic arthritis ([place]) 10/14/2021    Priority: Medium    Pseudogout involving multiple joints 10/01/2020    Priority: Medium    Mixed hyperlipidemia 07/12/2018    Priority: Medium    Statin intolerance 07/12/2018    Priority: Medium    Pulmonary HTN ([place]) 06/14/2018    Priority: Medium    COVID-19 07/13/2021    Priority: Low   Personal history of COVID-19 10/25/2020    Priority: Low   Pulmonary emphysema ([place]) 06/14/2018    Priority: Low   Past Surgical History:  Procedure Laterality Date   CARDIAC CATHETERIZATION     COLONOSCOPY WITH PROPOFOL N/A 10/24/2016   Procedure: COLONOSCOPY WITH PROPOFOL;  Surgeon: Garlan Fair, MD;  Location: [facility];  Service: Endoscopy;  Laterality: N/A;   HYDROCELE EXCISION / REPAIR     KNEE  SURGERY     REFRACTIVE SURGERY     TONSILLECTOMY      Family History  Problem Relation Age of Onset   Heart failure Father    Bipolar disorder Brother    Diabetes Brother    Cancer Paternal Grandmother     Medications- reviewed and updated Current Outpatient Medications  Medication Sig Dispense Refill   aspirin EC 81 MG tablet Take 81 mg by mouth daily. Swallow whole.     B Complex Vitamins (VITAMIN B COMPLEX) TABS Take by mouth.     Cholecalciferol (VITAMIN D3 PO) Take 1 capsule by mouth daily.     colchicine 0.6 MG tablet Take 1 tablet (0.6 mg total) by mouth daily. 90 tablet 0   Continuous Blood Gluc Sensor (FREESTYLE LIBRE 3 SENSOR) MISC Quantity: 2 sensors/month NDC# [phone]. [id number] 2 each 12   CONTOUR NEXT TEST test strip      ENTRESTO 97-103 MG TAKE 1 TABLET BY MOUTH TWICE DAILY 180 tablet 3   Evolocumab (REPATHA SURECLICK) 932 MG/ML SOAJ ADMINISTER 1 ML(140 MG) UNDER THE SKIN EVERY 14 DAYS 6 mL 3   furosemide (LASIX) 20 MG tablet Take 1 tablet (20 mg total) by mouth daily as needed for edema. 90 tablet 3   hydroxychloroquine (PLAQUENIL) 200 MG tablet Take 200 mg by mouth 2 (two) times daily.     Insulin Glargine (BASAGLAR KWIKPEN) 100 UNIT/ML Inject 10-20 Units  into the skin daily. Please provide pen needles 15 mL 5   Insulin Pen Needle (B-D ULTRAFINE III SHORT PEN) 31G X 8 MM MISC USE AS DIRECTED TO INJECT BASAGLAR EVERY DAY 100 each 3   metoprolol succinate (TOPROL-XL) 50 MG 24 hr tablet Take 1 tablet (50 mg total) by mouth daily. 90 tablet 3   Semaglutide,0.25 or 0.'5MG'$ /DOS, (OZEMPIC, 0.25 OR 0.5 MG/DOSE,) 2 MG/3ML SOPN Inject 0.25 mg into the skin once a week for 28 days, THEN 0.5 mg once a week for 28 days. 3 mL 5   triamcinolone (KENALOG) 0.1 % 1 application to affected area     gabapentin (NEURONTIN) 100 MG capsule Take 2-3 capsules (200-300 mg total) by mouth at bedtime. 90 capsule 5   nitroGLYCERIN (NITROSTAT) 0.4 MG SL tablet Place 1 tablet (0.4 mg total) under  the tongue every 5 (five) minutes as needed for chest pain. 90 tablet 3   No current facility-administered medications for this visit.    Allergies-reviewed and updated Allergies  Allergen Reactions   Jardiance [Empagliflozin] Other (See Comments)    Pain and stiffness   Statins Other (See Comments)    Terrible joint pain   Tetanus Toxoids Other (See Comments)    Fever 104/105 & arm stiffiness    Social History   Social History Narrative   Married. 2 children. 3 grandkids.       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    - [employer]ational [employer]      Hobbies: golf, woodworking, fly tying for fly fishing   Objective  Objective:  BP 104/62   Pulse 75   Temp (!) 97.5 F (36.4 C)   Ht '5\' 6"'$  (1.676 m)   Wt 170 lb 3.2 oz (77.2 kg)   SpO2 98%   BMI 27.47 kg/m  Gen: NAD, resting comfortably HEENT: Mucous membranes are moist. Oropharynx normal Neck: no thyromegaly CV: RRR no murmurs rubs or gallops Lungs: CTAB no crackles, wheeze, rhonchi Abdomen: soft/nontender/nondistended/normal bowel sounds. No rebound or guarding.  Ext: no edema Skin: warm, dry Neuro: grossly normal, moves all extremities, PERRLA  Diabetic Foot Exam - Simple   Simple Foot Form Diabetic Foot exam was performed with the following findings: Yes 07/04/2022  2:49 PM  Visual Inspection No deformities, no ulcerations, no other skin breakdown bilaterally: Yes Sensation Testing Intact to touch and monofilament testing bilaterally: Yes Pulse Check Posterior Tibialis and Dorsalis pulse intact bilaterally: Yes Comments Perhaps mild decrease in sensation on left foot       Assessment and Plan  76 y.o. male presenting for annual physical.  Health Maintenance counseling: 1. Anticipatory guidance: Patient counseled regarding regular dental exams -q6 months, eye exams -yearly,  avoiding smoking and second hand smoke , limiting alcohol to 2 beverages per day - once a month  maybe, no illicit drugs - uses THC though nighly to help with pain and sleep.   2. Risk factor reduction:  Advised patient of need for regular exercise and diet rich and fruits and vegetables to reduce risk of heart attack and stroke.  Exercise- getting  this daily with walking 8-10k steps.  Diet/weight management-weight pretty stabl- down 3 lbs from last visit- trying to eat healthy Wt Readings from Last 3 Encounters:  07/04/22 170 lb 3.2 oz (77.2 kg)  06/07/22 171 lb (77.6 kg)  06/06/22 165 lb (74.8 kg)  3. Immunizations/screenings/ancillary studies- prevnar 20 today Immunization History  Administered Date(s) Administered  Fluad Quad(high Dose 65+) 04/29/2019, 06/21/2021, 06/07/2022   Influenza, High Dose Seasonal PF 07/05/2017   MODERNA COVID-19 SARS-COV-2 PEDS BIVALENT BOOSTER 6Y-11Y 05/25/2022   PFIZER Comirnaty([place])Covid-19 Tri-Sucrose Vaccine 10/03/2019, 10/25/2019, 06/01/2020, 06/10/2021   Pneumococcal Conjugate-13 07/03/2014   Pneumococcal Polysaccharide-23 09/14/2008   Unspecified SARS-COV-2 Vaccination 06/10/2021   Zoster Recombinat (Shingrix) 02/19/2019, 06/04/2019  4. Prostate cancer screening- passed age based screening recommendations 5. Colon cancer screening - with Dr. Delrae Alfred in may- will need another due to polyps.  6. Skin cancer screening- sees dermatology- has had some spots removed or frozen. advised regular sunscreen use. Denies worrisome, changing, or new skin lesions.  7. Smoking associated screening (lung cancer screening, AAA screen 65-75, UA)- former smoker- quit 2018- in lung cancer screening program 8. 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 Issues with SGLT2's CBGs- morning sugar 90-110.    A/P: diabetes hopefully improving but with ongoing explosive diarrhea- metformin really not the best option at least at this dose. We opted to trial off for 1 week and then start ozempic -0.25 mg dose weekly for 4 weeks -0.5 mg dose weekly for 4 weeks -if tolerating let me know and I wills end in more of the 0.5 mg -may be able to reuses metformin in the future lower dose  -for neuropathy- trial '200mg'$  of gabapentin- and if doing well after 2-3 weeks can increase to '300mg'$ - lets push at first to see if we can control nighttime symptoms better.   #CAD-follows with Dr. Holland Falling RCA but diffuse mild disease. Is working with some shortness of breath- plan is to trial nitroglycerin to see if helpful #Chronic systolic and diastolic heart failure related to ischemic cardiomyopathy in addition to nonischemic cardiomyopathy #PAD-carotid bruit and left subclavian steal syndrome #hyperlipidemia with statin intolerance-myopathy history noted S: Medication:Aspirin 81 mg Lab Results  Component Value Date   CHOL 128 06/23/2021   HDL 48.20 06/23/2021   LDLCALC 31 12/24/2019   LDLDIRECT 50.0 06/23/2021   TRIG 262.0 (H) 06/23/2021   CHOLHDL 3 06/23/2021  -For lipids on Repatha -For heart failure on Entresto 97-103 mg, metoprolol extended release 50 mg, Lasix 20 mg daily as needed- takes daily -Has not tolerated spironolactone in the past  -Did not tolerate Jardiance due to excessive urination or farxiga   A/P: CAD with possible stable angina- continue current meds CHF- euvolemic and stable- continue current meds Lipids- update with labs- LDL well controlled   #COPD/former smoker S: medication: none A/P:  incidental finding- not really having symptoms unless SOB related tot this  -in lung cancer screening program   # GERD - ok without meds for now  #Psoriatic arthritis? S: on hydroxychloroquine '200mg'$   for possible psoriatic arthritis, on turmeric.  Follows with Dr. Trudie Reed- eyes being monitored with optho A/P: overall stable- continue current meds   # psuedogout S:Colchicine through Dr. 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 Delrae Alfred to surgery if path reassuring- maybe ERCP- negative high grade!!!! -not candidate for transesphageal- holding on whipple- opinion with Dr. Crisoforo [facility] -q6 month follow up with Dr. Delrae Alfred  Recommended follow up: Return in about 14 weeks (around 10/10/2022) for followup or sooner if needed.Schedule b4 you leave. Future Appointments  Date Time Provider [place]  08/25/2022 11:45 AM [facility] HEALTH COACH [facility]  09/22/2022  8:20 AM Buford Dresser, MD [facility]  06/08/2023  9:30 AM [facility] 1 [facility]   Lab/Order associations:NOT fasting   ICD-10-CM   1. Preventative health care  Z00.00     2. Myopathy  G72.9     3. Former smoker  Z87.891 Urinalysis, Routine w reflex microscopic    4. Poorly controlled diabetes mellitus (HCC)  E11.65 Microalbumin / creatinine urine ratio    CBC with Differential/Platelet    Comprehensive metabolic panel    Lipid panel    Hemoglobin A1c      Meds ordered this encounter  Medications   gabapentin (NEURONTIN) 100 MG capsule    Sig: Take 2-3 capsules (200-300 mg total) by mouth at bedtime.    Dispense:  90 capsule    Refill:  5   Semaglutide,0.25 or 0.'5MG'$ /DOS, (OZEMPIC, 0.25 OR 0.5 MG/DOSE,) 2 MG/3ML SOPN    Sig: Inject 0.25 mg into  the skin once a week for 28 days, THEN 0.5 mg once a week for 28 days.    Dispense:  3 mL    Refill:  5    Return precautions advised.  Garret Reddish, MD

## 2022-07-04 NOTE — Addendum Note (Signed)
Addended by: Clyde Lundborg A on: 07/04/2022 03:25 PM   Modules accepted: Orders

## 2022-07-04 NOTE — Patient Instructions (Addendum)
Health Maintenance Due  Topic Date Due   Medicare Annual Wellness (AWV)  08/16/2022  You are eligible to schedule your annual wellness visit with our nurse specialist Otila Kluver.  Please consider scheduling this before you leave today  Prevnar 20 today  -for neuropathy- trial '200mg'$  of gabapentin- and if doing well after 2-3 weeks can increase to '300mg'$ - lets push at first to see if we can control nighttime symptoms better.   diabetes hopefully improving but with ongoing explosive diarrhea - metformin really not the best option at least at this dose. We opted to stop long term- after 1 week and then start ozempic -0.25 mg dose weekly for 4 weeks -0.5 mg dose weekly for 4 weeks -if tolerating let me know and I wills end in more of the 0.5 mg -may be able to reuses metformin in the future lower dose  Please stop by lab before you go If you have mychart- we will send your results within 3 business days of [place] receiving them.  If you do not have mychart- we will call you about results within 5 business days of [place] receiving them.  *please also note that you will see labs on mychart as soon as they post. I will later go in and write notes on them- will say "notes from Dr. Yong Channel"   Recommended follow up: Return in about 14 weeks (around 10/10/2022) for followup or sooner if needed.Schedule b4 you leave.

## 2022-07-05 LAB — COMPREHENSIVE METABOLIC PANEL
ALT: 18 U/L (ref 0–53)
AST: 17 U/L (ref 0–37)
Albumin: 4.2 g/dL (ref 3.5–5.2)
Alkaline Phosphatase: 82 U/L (ref 39–117)
BUN: 22 mg/dL (ref 6–23)
CO2: 28 mEq/L (ref 19–32)
Calcium: 10 mg/dL (ref 8.4–10.5)
Chloride: 101 mEq/L (ref 96–112)
Creatinine, Ser: 1.22 mg/dL (ref 0.40–1.50)
GFR: 57.31 mL/min — ABNORMAL LOW (ref >60.00)
Glucose, Bld: 125 mg/dL — ABNORMAL HIGH (ref 70–99)
Potassium: 4.5 mEq/L (ref 3.5–5.1)
Sodium: 138 mEq/L (ref 135–145)
Total Bilirubin: 0.3 mg/dL (ref 0.2–1.2)
Total Protein: 6.6 g/dL (ref 6.0–8.3)

## 2022-07-05 LAB — HEMOGLOBIN A1C: Hgb A1c MFr Bld: 7.3 % — ABNORMAL HIGH (ref 4.6–6.5)

## 2022-07-05 LAB — CBC WITH DIFFERENTIAL/PLATELET
Basophils Absolute: 0.1 10*3/uL (ref 0.0–0.1)
Basophils Relative: 1.3 % (ref 0.0–3.0)
Eosinophils Absolute: 0.7 10*3/uL (ref 0.0–0.7)
Eosinophils Relative: 7.9 % — ABNORMAL HIGH (ref 0.0–5.0)
HCT: 36.6 % — ABNORMAL LOW (ref 39.0–52.0)
Hemoglobin: 12.3 g/dL — ABNORMAL LOW (ref 13.0–17.0)
Lymphocytes Relative: 22.3 % (ref 12.0–46.0)
Lymphs Abs: 2 10*3/uL (ref 0.7–4.0)
MCHC: 33.6 g/dL (ref 30.0–36.0)
MCV: 91 fl (ref 78.0–100.0)
Monocytes Absolute: 0.7 10*3/uL (ref 0.1–1.0)
Monocytes Relative: 7.9 % (ref 3.0–12.0)
Neutro Abs: 5.3 10*3/uL (ref 1.4–7.7)
Neutrophils Relative %: 60.6 % (ref 43.0–77.0)
Platelets: 354 10*3/uL (ref 150.0–400.0)
RBC: 4.02 Mil/uL — ABNORMAL LOW (ref 4.22–5.81)
RDW: 13.9 % (ref 11.5–15.5)
WBC: 8.8 10*3/uL (ref 4.0–10.5)

## 2022-07-05 LAB — LDL CHOLESTEROL, DIRECT: Direct LDL: 47 mg/dL

## 2022-07-05 LAB — LIPID PANEL
Cholesterol: 123 mg/dL (ref 0–200)
HDL: 39.7 mg/dL (ref >39.00)
Total CHOL/HDL Ratio: 3
Triglycerides: 460 mg/dL — ABNORMAL HIGH (ref 0.0–149.0)

## 2022-07-06 ENCOUNTER — Other Ambulatory Visit: Payer: Self-pay | Admitting: Family Medicine

## 2022-07-06 DIAGNOSIS — Z87891 Personal history of nicotine dependence: Secondary | ICD-10-CM

## 2022-07-06 DIAGNOSIS — E1165 Type 2 diabetes mellitus with hyperglycemia: Secondary | ICD-10-CM

## 2022-07-07 ENCOUNTER — Telehealth: Payer: Self-pay | Admitting: Family Medicine

## 2022-07-07 NOTE — Telephone Encounter (Signed)
LVM with pt's spouse to have pt come in for labs. There was a lab error.

## 2022-07-12 ENCOUNTER — Other Ambulatory Visit (INDEPENDENT_AMBULATORY_CARE_PROVIDER_SITE_OTHER): Payer: Medicare Other

## 2022-07-12 DIAGNOSIS — E1165 Type 2 diabetes mellitus with hyperglycemia: Secondary | ICD-10-CM | POA: Diagnosis not present

## 2022-07-12 DIAGNOSIS — Z87891 Personal history of nicotine dependence: Secondary | ICD-10-CM

## 2022-07-12 LAB — URINALYSIS, ROUTINE W REFLEX MICROSCOPIC
Bilirubin Urine: NEGATIVE
Hgb urine dipstick: NEGATIVE
Ketones, ur: NEGATIVE
Leukocytes,Ua: NEGATIVE
Nitrite: NEGATIVE
RBC / HPF: NONE SEEN (ref 0–?)
Specific Gravity, Urine: 1.015 (ref 1.000–1.030)
Total Protein, Urine: NEGATIVE
Urine Glucose: NEGATIVE
Urobilinogen, UA: 0.2 (ref 0.0–1.0)
pH: 5.5 (ref 5.0–8.0)

## 2022-07-12 LAB — MICROALBUMIN / CREATININE URINE RATIO
Creatinine,U: 61.7 mg/dL
Microalb Creat Ratio: 1.6 mg/g (ref 0.0–30.0)
Microalb, Ur: 1 mg/dL (ref 0.0–1.9)

## 2022-07-18 ENCOUNTER — Encounter: Payer: Self-pay | Admitting: Family Medicine

## 2022-08-01 ENCOUNTER — Telehealth: Payer: Self-pay | Admitting: Pharmacist Clinician (PhC)/ Clinical Pharmacy Specialist

## 2022-08-01 NOTE — Telephone Encounter (Signed)
Repatha PA sent and approved to 08/02/2023   Key:  [id number]

## 2022-08-03 DIAGNOSIS — D485 Neoplasm of uncertain behavior of skin: Secondary | ICD-10-CM | POA: Diagnosis not present

## 2022-08-03 DIAGNOSIS — C44319 Basal cell carcinoma of skin of other parts of face: Secondary | ICD-10-CM | POA: Diagnosis not present

## 2022-08-03 DIAGNOSIS — L570 Actinic keratosis: Secondary | ICD-10-CM | POA: Diagnosis not present

## 2022-08-03 DIAGNOSIS — C44329 Squamous cell carcinoma of skin of other parts of face: Secondary | ICD-10-CM | POA: Diagnosis not present

## 2022-08-03 DIAGNOSIS — L218 Other seborrheic dermatitis: Secondary | ICD-10-CM | POA: Diagnosis not present

## 2022-08-10 ENCOUNTER — Telehealth: Payer: Self-pay | Admitting: Family Medicine

## 2022-08-10 NOTE — Telephone Encounter (Signed)
Pt states: -BCBS insurance alerted patient that his Insulin pen prescription would not be in his 2024 formulary. -Insuline pen has worked well for patient this past year. -Two follow up options: Prescribe an alternative medication that is on the new formulary OR have provider apply for drug exception. -Currently taking approximately 20 mg a day, has 8 pens.    Pt requests: -follow up on what PCP team suggest to be done.  -Return call on wife's cell phone.

## 2022-08-10 NOTE — Telephone Encounter (Signed)
Please have him ask insurance which alternatives are covered- ill review and see if appropriate option

## 2022-08-10 NOTE — Telephone Encounter (Signed)
Keep me updated on what he finds out

## 2022-08-11 ENCOUNTER — Encounter: Payer: Self-pay | Admitting: Family Medicine

## 2022-08-12 MED ORDER — TOUJEO MAX SOLOSTAR 300 UNIT/ML ~~LOC~~ SOPN: PEN_INJECTOR | SUBCUTANEOUS | 5 refills | Status: DC

## 2022-08-17 ENCOUNTER — Telehealth: Payer: Self-pay | Admitting: Pharmacist

## 2022-08-17 NOTE — Progress Notes (Unsigned)
Chronic Care Management Pharmacy Assistant   Name: Casey Lynch  MRN: [id number] DOB: 03-Feb-1946   Reason for Encounter: Diabetes Adherence Call    Recent office visits:  07/04/2022 OV (PCP) Marin Olp, MD; -for neuropathy- trial '200mg'$  of gabapentin- and if doing well after 2-3 weeks can increase to '300mg'$ - lets push at first to see if we can control nighttime symptoms better.   06/07/2022 OV (Fam Med) Inda Coke, PA; no medication changes indicated.  Recent consult visits:  06/01/2022 OV (Cardiology) Buford Dresser, MD; no medication changes indicated.  Hospital visits:  None in previous 6 months  Medications: Outpatient Encounter Medications as of 08/17/2022  Medication Sig   aspirin EC 81 MG tablet Take 81 mg by mouth daily. Swallow whole.   B Complex Vitamins (VITAMIN B COMPLEX) TABS Take by mouth.   Cholecalciferol (VITAMIN D3 PO) Take 1 capsule by mouth daily.   colchicine 0.6 MG tablet Take 1 tablet (0.6 mg total) by mouth daily.   Continuous Blood Gluc Sensor (FREESTYLE LIBRE 3 SENSOR) MISC Quantity: 2 sensors/month NDC# [phone]. [id number]   CONTOUR NEXT TEST test strip    ENTRESTO 97-103 MG TAKE 1 TABLET BY MOUTH TWICE DAILY   Evolocumab (REPATHA SURECLICK) 751 MG/ML SOAJ ADMINISTER 1 ML(140 MG) UNDER THE SKIN EVERY 14 DAYS   furosemide (LASIX) 20 MG tablet Take 1 tablet (20 mg total) by mouth daily as needed for edema.   gabapentin (NEURONTIN) 100 MG capsule Take 2-3 capsules (200-300 mg total) by mouth at bedtime.   hydroxychloroquine (PLAQUENIL) 200 MG tablet Take 200 mg by mouth 2 (two) times daily.   insulin glargine, 2 Unit Dial, (TOUJEO MAX SOLOSTAR) 300 UNIT/ML Solostar Pen Inject 10-20 Units into the skin daily. Please provide pen needles   Insulin Pen Needle (B-D ULTRAFINE III SHORT PEN) 31G X 8 MM MISC USE AS DIRECTED TO INJECT BASAGLAR EVERY DAY   metoprolol succinate (TOPROL-XL) 50 MG 24 hr tablet Take 1 tablet (50 mg total) by  mouth daily.   nitroGLYCERIN (NITROSTAT) 0.4 MG SL tablet Place 1 tablet (0.4 mg total) under the tongue every 5 (five) minutes as needed for chest pain.   Semaglutide,0.25 or 0.'5MG'$ /DOS, (OZEMPIC, 0.25 OR 0.5 MG/DOSE,) 2 MG/3ML SOPN Inject 0.25 mg into the skin once a week for 28 days, THEN 0.5 mg once a week for 28 days.   triamcinolone (KENALOG) 0.1 % 1 application to affected area   No facility-administered encounter medications on file as of 08/17/2022.   Recent Relevant Labs: Lab Results  Component Value Date/Time   HGBA1C 7.3 (H) 07/04/2022 03:10 PM   HGBA1C 8.1 (H) 01/17/2022 04:36 PM   MICROALBUR 1.0 07/12/2022 02:05 PM   MICROALBUR <0.7 10/01/2020 09:53 AM    Kidney Function Lab Results  Component Value Date/Time   CREATININE 1.22 07/04/2022 03:10 PM   CREATININE 1.26 01/17/2022 04:36 PM   GFR 57.31 (L) 07/04/2022 03:10 PM   GFRNONAA 58 (L) 12/24/2019 04:05 PM   GFRAA 67 12/24/2019 04:05 PM    Current antihyperglycemic regimen:  Ozempic once a week Basaglar 20 units daily  What recent interventions/DTPs have been made to improve glycemic control:  Patient wife states their insurance will no longer cover basaglar so he has switched to [place] instead.  Have there been any recent hospitalizations or ED visits since last visit with CPP? No  Patient denies hypoglycemic symptoms.  Patient denies hyperglycemic symptoms.  How often are you checking your blood sugar? once daily  What are your blood sugars ranging?  Fasting: 120-125  During the week, how often does your blood glucose drop below 70? Never  Are you checking your feet daily/regularly? Yes  Adherence Review: Is the patient currently on a STATIN medication? No Is the patient currently on ACE/ARB medication? Yes Does the patient have >5 day gap between last estimated fill dates? No  Patients wife had a few questions regarding new insulin Toujeo. Patient still has 7 pens left of Basaglar that should last  him until March or April is it okay to continue this before starting Toujeo? Patients wife also wanted to know if the patient is currently taking 20 units of 100u/mL of Basaglar should he take less of the Toujeo 300u/mL since they both are insulin glargine and the Toujeo is 300u/mL instead of 100u/mL? Patients sugars have been stable with Basaglar 20 units daily as report above.  Care Gaps: Medicare Annual Wellness: Scheduled 08/25/2022  Ophthalmology Exam: Completed 05/26/2022 Foot Exam: Completed 07/04/2022 Hemoglobin A1C: 7.3% on 07/04/2022 Colonoscopy: Completed 10/24/2016  Future Appointments  Date Time Provider [place]  08/25/2022 11:45 AM [facility] HEALTH COACH [facility]  09/22/2022  8:20 AM Buford Dresser, MD [facility]  10/10/2022 11:20 AM Marin Olp, MD [facility]  06/08/2023  9:30 AM [facility] 1 [facility]   Star Rating Drugs: Entresto 97-103 mg last filled 07/26/2022 90 DS Ozempic last filled 07/20/2022 70 DS  April D Calhoun, [place] Pharmacist Assistant [phone]

## 2022-08-18 DIAGNOSIS — M0609 Rheumatoid arthritis without rheumatoid factor, multiple sites: Secondary | ICD-10-CM | POA: Diagnosis not present

## 2022-08-18 DIAGNOSIS — M1991 Primary osteoarthritis, unspecified site: Secondary | ICD-10-CM | POA: Diagnosis not present

## 2022-08-18 DIAGNOSIS — E089 Diabetes mellitus due to underlying condition without complications: Secondary | ICD-10-CM | POA: Diagnosis not present

## 2022-08-18 DIAGNOSIS — Z79899 Other long term (current) drug therapy: Secondary | ICD-10-CM | POA: Diagnosis not present

## 2022-08-18 DIAGNOSIS — M1120 Other chondrocalcinosis, unspecified site: Secondary | ICD-10-CM | POA: Diagnosis not present

## 2022-08-18 NOTE — Chronic Care Management (AMB) (Signed)
The dose called in for the [place] is the same as the old dose for the [place].  He can continue Basaglar until he runs out if he wishes.   The units will be the same even though one is 100u/ml and the other is 300units/ml.  This just means that the Toujeo is more concentrated and will take less "liquid"  to get the 20 unit dose.  He may be using less medication but the amount of units will be the same.  Hope that makes sense!  Beverly Milch, PharmD Clinical Pharmacist  [facility] [phone]

## 2022-08-23 ENCOUNTER — Telehealth: Payer: Self-pay | Admitting: Pharmacist

## 2022-08-23 DIAGNOSIS — E119 Type 2 diabetes mellitus without complications: Secondary | ICD-10-CM

## 2022-08-23 NOTE — Chronic Care Management (AMB) (Signed)
Patient stopped by the office today and I saw him regarding questions about his medications from him and his wife.  Finished week 4 of Ozempic and reports nausea, GERD, and constipation.  He no longer wishes to take this medication.  I see he has tried SGLT-2 before but he was willing to give Wilder Glade another shot (more tolerable per his report.) Noting he also has CHF, this seems to be a great option for him.  Would you consider stopping Ozempic and re-trial Farxiga '10mg'$  to help with glucose and help with CHF/fluid?  Beverly Milch, PharmD Clinical Pharmacist  [facility] [phone]

## 2022-08-23 NOTE — Telephone Encounter (Signed)
Yes thanks-can you stop this medication and send in [place] 10 mg daily  #30 with 5 refills- I would also be open to 5 mg dose if he wants to try the lower dose at same daily #30 with 5 refills

## 2022-08-24 MED ORDER — DAPAGLIFLOZIN PROPANEDIOL 5 MG PO TABS: 5.0000 mg | ORAL_TABLET | Freq: Every day | ORAL | 5 refills | Status: DC

## 2022-08-24 NOTE — Addendum Note (Signed)
Addended by: Edythe Clarity on: 08/24/2022 01:55 PM   Modules accepted: Orders

## 2022-08-24 NOTE — Chronic Care Management (AMB) (Signed)
Called to discuss with patient.  He is going to d/c Ozempic.   We will try Farxiga '5mg'$  once daily #30 with 5 refills. - Prescription to be sent into [facility].  Already has FU visit scheduled for February. Beverly Milch, PharmD Clinical Pharmacist  [facility] [phone]

## 2022-08-25 ENCOUNTER — Ambulatory Visit (INDEPENDENT_AMBULATORY_CARE_PROVIDER_SITE_OTHER): Payer: Medicare Other

## 2022-08-25 VITALS — Wt 162.0 lb

## 2022-08-25 DIAGNOSIS — Z0000 Encounter for general adult medical examination without abnormal findings: Secondary | ICD-10-CM

## 2022-08-25 NOTE — Patient Instructions (Signed)
[name] , Thank you for taking time to come for your Medicare Wellness Visit. I appreciate your ongoing commitment to your health goals. Please review the following plan we discussed and let me know if I can assist you in the future.   These are the goals we discussed:  Goals      Monitor and Manage My Blood Sugar-Diabetes Type 2     Timeframe:  Long-Range Goal Priority:  High Start Date:  08/16/21                           Expected End Date:  02/14/22                     Follow Up Date 11/14/21    - check blood sugar at prescribed times - check blood sugar if I feel it is too high or too low - enter blood sugar readings and medication or insulin into daily log    Why is this important?   Checking your blood sugar at home helps to keep it from getting very high or very low.  Writing the results in a diary or log helps the doctor know how to care for you.  Your blood sugar log should have the time, date and the results.  Also, write down the amount of insulin or other medicine that you take.  Other information, like what you ate, exercise done and how you were feeling, will also be helpful.     Notes:      Patient Stated     Get Diabetes under control     Patient Stated     Stay healthy and active         This is a list of the screening recommended for you and due dates:  Health Maintenance  Topic Date Due   COVID-19 Vaccine (6 - 2023-24 season) 07/20/2022   Hemoglobin A1C  01/03/2023   Eye exam for diabetics  05/27/2023   Screening for Lung Cancer  06/07/2023   Yearly kidney function blood test for diabetes  07/05/2023   Complete foot exam   07/05/2023   Yearly kidney health urinalysis for diabetes  07/13/2023   Medicare Annual Wellness Visit  08/26/2023   Flu Shot  Completed   Hepatitis C Screening: USPSTF Recommendation to screen - Ages 18-79 yo.  Completed   Zoster (Shingles) Vaccine  Completed   HPV Vaccine  Aged Out   DTaP/Tdap/Td vaccine  Discontinued    Pneumonia Vaccine  Discontinued   Colon Cancer Screening  Discontinued    Advanced directives: copies in chart   Conditions/risks identified: stay  healthy and active   Next appointment: Follow up in one year for your annual wellness visit.   Preventive Care 52 Years and Older, Male  Preventive care refers to lifestyle choices and visits with your health care provider that can promote health and wellness. What does preventive care include? A yearly physical exam. This is also called an annual well check. Dental exams once or twice a year. Routine eye exams. Ask your health care provider how often you should have your eyes checked. Personal lifestyle choices, including: Daily care of your teeth and gums. Regular physical activity. Eating a healthy diet. Avoiding tobacco and drug use. Limiting alcohol use. Practicing safe sex. Taking low doses of aspirin every day. Taking vitamin and mineral supplements as recommended by your health care provider. What happens during an annual well  check? The services and screenings done by your health care provider during your annual well check will depend on your age, overall health, lifestyle risk factors, and family history of disease. Counseling  Your health care provider may ask you questions about your: Alcohol use. Tobacco use. Drug use. Emotional well-being. Home and relationship well-being. Sexual activity. Eating habits. History of falls. Memory and ability to understand (cognition). Work and work Statistician. Screening  You may have the following tests or measurements: Height, weight, and BMI. Blood pressure. Lipid and cholesterol levels. These may be checked every 5 years, or more frequently if you are over 79 years old. Skin check. Lung cancer screening. You may have this screening every year starting at age 55 if you have a 30-pack-year history of smoking and currently smoke or have quit within the past 15 years. Fecal  occult blood test (FOBT) of the stool. You may have this test every year starting at age 36. Flexible sigmoidoscopy or colonoscopy. You may have a sigmoidoscopy every 5 years or a colonoscopy every 10 years starting at age 11. Prostate cancer screening. Recommendations will vary depending on your family history and other risks. Hepatitis C blood test. Hepatitis B blood test. Sexually transmitted disease (STD) testing. Diabetes screening. This is done by checking your blood sugar (glucose) after you have not eaten for a while (fasting). You may have this done every 1-3 years. Abdominal aortic aneurysm (AAA) screening. You may need this if you are a current or former smoker. Osteoporosis. You may be screened starting at age 55 if you are at high risk. Talk with your health care provider about your test results, treatment options, and if necessary, the need for more tests. Vaccines  Your health care provider may recommend certain vaccines, such as: Influenza vaccine. This is recommended every year. Tetanus, diphtheria, and acellular pertussis (Tdap, Td) vaccine. You may need a Td booster every 10 years. Zoster vaccine. You may need this after age 49. Pneumococcal 13-valent conjugate (PCV13) vaccine. One dose is recommended after age 22. Pneumococcal polysaccharide (PPSV23) vaccine. One dose is recommended after age 50. Talk to your health care provider about which screenings and vaccines you need and how often you need them. This information is not intended to replace advice given to you by your health care provider. Make sure you discuss any questions you have with your health care provider. Document Released: 09/18/2015 Document Revised: 05/11/2016 Document Reviewed: 06/23/2015 Elsevier Interactive Patient Education  2017 [place] Prevention in the Home Falls can cause injuries. They can happen to people of all ages. There are many things you can do to make your home safe and to  help prevent falls. What can I do on the outside of my home? Regularly fix the edges of walkways and driveways and fix any cracks. Remove anything that might make you trip as you walk through a door, such as a raised step or threshold. Trim any bushes or trees on the path to your home. Use bright outdoor lighting. Clear any walking paths of anything that might make someone trip, such as rocks or tools. Regularly check to see if handrails are loose or broken. Make sure that both sides of any steps have handrails. Any raised decks and porches should have guardrails on the edges. Have any leaves, snow, or ice cleared regularly. Use sand or salt on walking paths during winter. Clean up any spills in your garage right away. This includes oil or grease spills. What can  I do in the bathroom? Use night lights. Install grab bars by the toilet and in the tub and shower. Do not use towel bars as grab bars. Use non-skid mats or decals in the tub or shower. If you need to sit down in the shower, use a plastic, non-slip stool. Keep the floor dry. Clean up any water that spills on the floor as soon as it happens. Remove soap buildup in the tub or shower regularly. Attach bath mats securely with double-sided non-slip rug tape. Do not have throw rugs and other things on the floor that can make you trip. What can I do in the bedroom? Use night lights. Make sure that you have a light by your bed that is easy to reach. Do not use any sheets or blankets that are too big for your bed. They should not hang down onto the floor. Have a firm chair that has side arms. You can use this for support while you get dressed. Do not have throw rugs and other things on the floor that can make you trip. What can I do in the kitchen? Clean up any spills right away. Avoid walking on wet floors. Keep items that you use a lot in easy-to-reach places. If you need to reach something above you, use a strong step stool that has  a grab bar. Keep electrical cords out of the way. Do not use floor polish or wax that makes floors slippery. If you must use wax, use non-skid floor wax. Do not have throw rugs and other things on the floor that can make you trip. What can I do with my stairs? Do not leave any items on the stairs. Make sure that there are handrails on both sides of the stairs and use them. Fix handrails that are broken or loose. Make sure that handrails are as long as the stairways. Check any carpeting to make sure that it is firmly attached to the stairs. Fix any carpet that is loose or worn. Avoid having throw rugs at the top or bottom of the stairs. If you do have throw rugs, attach them to the floor with carpet tape. Make sure that you have a light switch at the top of the stairs and the bottom of the stairs. If you do not have them, ask someone to add them for you. What else can I do to help prevent falls? Wear shoes that: Do not have high heels. Have rubber bottoms. Are comfortable and fit you well. Are closed at the toe. Do not wear sandals. If you use a stepladder: Make sure that it is fully opened. Do not climb a closed stepladder. Make sure that both sides of the stepladder are locked into place. Ask someone to hold it for you, if possible. Clearly mark and make sure that you can see: Any grab bars or handrails. First and last steps. Where the edge of each step is. Use tools that help you move around (mobility aids) if they are needed. These include: Canes. Walkers. Scooters. Crutches. Turn on the lights when you go into a dark area. Replace any light bulbs as soon as they burn out. Set up your furniture so you have a clear path. Avoid moving your furniture around. If any of your floors are uneven, fix them. If there are any pets around you, be aware of where they are. Review your medicines with your doctor. Some medicines can make you feel dizzy. This can increase your chance of  falling.  Ask your doctor what other things that you can do to help prevent falls. This information is not intended to replace advice given to you by your health care provider. Make sure you discuss any questions you have with your health care provider. Document Released: 06/18/2009 Document Revised: 01/28/2016 Document Reviewed: 09/26/2014 Elsevier Interactive Patient Education  2017 [employer].

## 2022-08-25 NOTE — Progress Notes (Signed)
I connected with  Casey Lynch on 08/25/22 by a audio enabled telemedicine application and verified that I am speaking with the correct person using two identifiers.along with wife Casey Lynch   Patient Location: Home  Provider Location: Office/Clinic  I discussed the limitations of evaluation and management by telemedicine. The patient expressed understanding and agreed to proceed.   Subjective:   Casey Lynch is a 76 y.o. male who presents for Medicare Annual/Subsequent preventive examination.  Review of Systems     Cardiac Risk Factors include: advanced age (>64mn, >>50women);diabetes mellitus;hypertension;dyslipidemia;male gender     Objective:    Today's Vitals   08/25/22 1129  Weight: 162 lb (73.5 kg)   Body mass index is 26.15 kg/m.     08/25/2022   11:38 AM 08/16/2021   11:40 AM 10/24/2016    8:37 AM  Advanced Directives  Does Patient Have a Medical Advance Directive? Yes Yes Yes  Type of AParamedicof ABoydLiving will Healthcare Power of ABeloitLiving will  Does patient want to make changes to medical advance directive? No - Patient declined    Copy of [place] Chart? Yes - validated most recent copy scanned in chart (See row information) Yes - validated most recent copy scanned in chart (See row information) No - copy requested    Current Medications (verified) Outpatient Encounter Medications as of 08/25/2022  Medication Sig   aspirin EC 81 MG tablet Take 81 mg by mouth daily. Swallow whole.   B Complex Vitamins (VITAMIN B COMPLEX) TABS Take by mouth.   Cholecalciferol (VITAMIN D3 PO) Take 1 capsule by mouth daily.   colchicine 0.6 MG tablet Take 1 tablet (0.6 mg total) by mouth daily.   Continuous Blood Gluc Sensor (FREESTYLE LIBRE 3 SENSOR) MISC Quantity: 2 sensors/month NDC# [id number]  CONTOUR NEXT TEST test strip    ENTRESTO 97-103 MG TAKE 1 TABLET BY MOUTH TWICE  DAILY   Evolocumab (REPATHA SURECLICK) 1128MG/ML SOAJ ADMINISTER 1 ML(140 MG) UNDER THE SKIN EVERY 14 DAYS   furosemide (LASIX) 20 MG tablet Take 1 tablet (20 mg total) by mouth daily as needed for edema.   gabapentin (NEURONTIN) 100 MG capsule Take 2-3 capsules (200-300 mg total) by mouth at bedtime.   hydroxychloroquine (PLAQUENIL) 200 MG tablet Take 200 mg by mouth 2 (two) times daily.   insulin glargine, 2 Unit Dial, (TOUJEO MAX SOLOSTAR) 300 UNIT/ML Solostar Pen Inject 10-20 Units into the skin daily. Please provide pen needles   Insulin Pen Needle (B-D ULTRAFINE III SHORT PEN) 31G X 8 MM MISC USE AS DIRECTED TO INJECT BASAGLAR EVERY DAY   metoprolol succinate (TOPROL-XL) 50 MG 24 hr tablet Take 1 tablet (50 mg total) by mouth daily.   triamcinolone (KENALOG) 0.1 % 1 application to affected area   dapagliflozin propanediol (FARXIGA) 5 MG TABS tablet Take 1 tablet (5 mg total) by mouth daily before breakfast. (Patient not taking: Reported on 08/25/2022)   nitroGLYCERIN (NITROSTAT) 0.4 MG SL tablet Place 1 tablet (0.4 mg total) under the tongue every 5 (five) minutes as needed for chest pain.   No facility-administered encounter medications on file as of 08/25/2022.    Allergies (verified) Jardiance [empagliflozin], Statins, and Tetanus toxoids   History: Past Medical History:  Diagnosis Date   Carotid bruit    Diabetes mellitus without complication (HCC)    Eczema    Heart failure (HCC)    History of colonic  polyps    Hypercholesterolemia    Hyperlipidemia    Macular degeneration    Osteoarthritis    Paresthesias    Pseudogout    Subclavian steal syndrome    Past Surgical History:  Procedure Laterality Date   CARDIAC CATHETERIZATION     COLONOSCOPY WITH PROPOFOL N/A 10/24/2016   Procedure: COLONOSCOPY WITH PROPOFOL;  Surgeon: Garlan Fair, MD;  Location: [facility];  Service: Endoscopy;  Laterality: N/A;   HYDROCELE EXCISION / REPAIR     KNEE SURGERY     REFRACTIVE  SURGERY     TONSILLECTOMY     Family History  Problem Relation Age of Onset   Heart failure Father    Bipolar disorder Brother    Diabetes Brother    Cancer Paternal Grandmother    Social History   Socioeconomic History   Marital status: Married    Spouse name: Not on file   Number of children: Not on file   Years of education: Not on file   Highest education level: Not on file  Occupational History   Not on file  Tobacco Use   Smoking status: Former    Packs/day: 1.00    Years: 59.00    Total pack years: 59.00    Types: Cigarettes    Quit date: 09/05/2016    Years since quitting: 5.9   Smokeless tobacco: Never  Vaping Use   Vaping Use: Never used  Substance and Sexual Activity   Alcohol use: Not Currently    Comment: Rare   Drug use: No   Sexual activity: Yes  Other Topics Concern   Not on file  Social History Narrative   Married. 2 children. 3 grandkids.       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    - congregational [employer]      Hobbies: golf, woodworking, fly tying for fly fishing   Social Determinants of Health   Financial Resource Strain: Low Risk  (08/25/2022)   Overall Financial Resource Strain (CARDIA)    Difficulty of Paying Living Expenses: Not hard at all  Food Insecurity: No Food Insecurity (08/25/2022)   Hunger Vital Sign    Worried About Running Out of Food in the Last Year: Never true    Ran Out of Food in the Last Year: Never true  Transportation Needs: No Transportation Needs (08/25/2022)   PRAPARE - Hydrologist (Medical): No    Lack of Transportation (Non-Medical): No  Physical Activity: Sufficiently Active (08/25/2022)   Exercise Vital Sign    Days of Exercise per Week: 5 days    Minutes of Exercise per Session: 60 min  Stress: No Stress Concern Present (08/25/2022)   [place]    Feeling of Stress : Not  at all  Social Connections: [place] (08/25/2022)   Social Connection and Isolation Panel [NHANES]    Frequency of Communication with Friends and Family: More than three times a week    Frequency of Social Gatherings with Friends and Family: More than three times a week    Attends Religious Services: More than 4 times per year    Active Member of [employer] or Organizations: Yes    Attends Archivist Meetings: 1 to 4 times per year    Marital Status: Married    Tobacco Counseling Counseling given: Not Answered   Clinical Intake:  Pre-visit preparation completed: Yes  Pain : No/denies pain     BMI - recorded: 26.15 Nutritional Status: BMI 25 -29 Overweight Nutritional Risks: None Diabetes: Yes CBG done?: Yes (132lbs per pt) CBG resulted in Enter/ Edit results?: No Did pt. bring in CBG monitor from home?: No  How often do you need to have someone help you when you read instructions, pamphlets, or other written materials from your doctor or pharmacy?: 1 - Never  Diabetic?Nutrition Risk Assessment:  Has the patient had any N/V/D within the last 2 months?  Yes related to medication  Does the patient have any non-healing wounds?  No  Has the patient had any unintentional weight loss or weight gain?  No   Diabetes:  Is the patient diabetic?  Yes  If diabetic, was a CBG obtained today?  Yes  Did the patient bring in their glucometer from home?  No  How often do you monitor your CBG's? Daily .   Financial Strains and Diabetes Management:  Are you having any financial strains with the device, your supplies or your medication? No .  Does the patient want to be seen by Chronic Care Management for management of their diabetes?  No  Would the patient like to be referred to a Nutritionist or for Diabetic Management?  No   Diabetic Exams:  Diabetic Eye Exam: Completed 05/26/22 Diabetic Foot Exam: Completed 07/04/22   Interpreter Needed?: No  Information  entered by :: Charlott Rakes, LPN   Activities of Daily Living    08/25/2022   11:39 AM  In your present state of health, do you have any difficulty performing the following activities:  Hearing? 0  Vision? 0  Difficulty concentrating or making decisions? 0  Walking or climbing stairs? 0  Dressing or bathing? 0  Doing errands, shopping? 0  Preparing Food and eating ? N  Using the Toilet? N  In the past six months, have you accidently leaked urine? N  Do you have problems with loss of bowel control? N  Managing your Medications? N  Managing your Finances? N  Housekeeping or managing your Housekeeping? N    Patient Care Team: Marin Olp, MD as PCP - General (Family Medicine) Buford Dresser, MD as PCP - Cardiology (Cardiology) Gavin Pound, MD as Consulting Physician (Rheumatology) Marshell Garfinkel, MD as Consulting Physician (Pulmonary Disease) Edythe Clarity, [facility] as Pharmacist (Pharmacist) Jarome Matin, MD as Consulting Physician (Dermatology)  Indicate any recent Medical Services you may have received from other than [facility] providers in the past year (date may be approximate).     Assessment:   This is a routine wellness examination for Casey Lynch.  Hearing/Vision screen Hearing Screening - Comments:: Pt denies any hearing aids  Vision Screening - Comments:: Pt follows up with Dr Herbert Deaner for annul eye exams   Dietary issues and exercise activities discussed: Current Exercise Habits: Home exercise routine, Type of exercise: walking;Other - see comments, Time (Minutes): > 60, Frequency (Times/Week): 6, Weekly Exercise (Minutes/Week): 0   Goals Addressed             This Visit's Progress    Patient Stated       Stay healthy and active        Depression Screen    08/25/2022   11:36 AM 08/16/2021   11:39 AM 06/21/2021    2:26 PM 11/25/2020   10:34 AM  PHQ 2/9 Scores  PHQ - 2 Score 0 0 0 0    Fall Risk    08/25/2022  11:39 AM 08/16/2021    11:40 AM 07/13/2021    8:25 AM 06/21/2021    2:25 PM 11/25/2020   10:34 AM  Fall Risk   Falls in the past year? 0 1 0 1 0  Number falls in past yr: 0 1  0   Injury with Fall? 0 1  0   Comment  bruised     Risk for fall due to : Impaired balance/gait;Impaired vision Impaired vision  No Fall Risks   Follow up Falls prevention discussed Falls prevention discussed  Falls evaluation completed     FALL RISK PREVENTION PERTAINING TO THE HOME:  Any stairs in or around the home? Yes  If so, are there any without handrails? No  Home free of loose throw rugs in walkways, pet beds, electrical cords, etc? Yes  Adequate lighting in your home to reduce risk of falls? Yes   ASSISTIVE DEVICES UTILIZED TO PREVENT FALLS:  Life alert? No  Use of a cane, walker or w/c? No  Grab bars in the bathroom? Yes  Shower chair or bench in shower? No  Elevated toilet seat or a handicapped toilet? No   TIMED UP AND GO:  Was the test performed? No .   Cognitive Function:        08/25/2022   11:40 AM 08/16/2021   11:42 AM  6CIT Screen  What Year? 0 points 0 points  What month? 0 points 0 points  What time? 0 points 0 points  Count back from 20 0 points 0 points  Months in reverse 0 points 0 points  Repeat phrase 2 points 0 points  Total Score 2 points 0 points    Immunizations Immunization History  Administered Date(s) Administered   Fluad Quad(high Dose 65+) 04/29/2019, 06/21/2021, 06/07/2022   Influenza, High Dose Seasonal PF 07/05/2017   MODERNA COVID-19 SARS-COV-2 PEDS BIVALENT BOOSTER 6Y-11Y 05/25/2022   PFIZER Comirnaty(Gray Top)Covid-19 Tri-Sucrose Vaccine 10/03/2019, 10/25/2019, 06/01/2020, 06/10/2021   PNEUMOCOCCAL CONJUGATE-20 07/04/2022   Pneumococcal Conjugate-13 07/03/2014   Pneumococcal Polysaccharide-23 09/14/2008   Unspecified SARS-COV-2 Vaccination 06/10/2021   Zoster Recombinat (Shingrix) 02/19/2019, 06/04/2019    Tdap discontinue   Flu Vaccine status: Up to  date  Pneumococcal vaccine status: Up to date  Covid-19 vaccine status: Completed vaccines  Qualifies for Shingles Vaccine? Yes   Zostavax completed Yes   Shingrix Completed?: Yes  Screening Tests Health Maintenance  Topic Date Due   COVID-19 Vaccine (6 - 2023-24 season) 07/20/2022   HEMOGLOBIN A1C  01/03/2023   OPHTHALMOLOGY EXAM  05/27/2023   Lung Cancer Screening  06/07/2023   Diabetic kidney evaluation - eGFR measurement  07/05/2023   FOOT EXAM  07/05/2023   Diabetic kidney evaluation - Urine ACR  07/13/2023   Medicare Annual Wellness (AWV)  08/26/2023   INFLUENZA VACCINE  Completed   Hepatitis C Screening  Completed   Zoster Vaccines- Shingrix  Completed   HPV VACCINES  Aged Out   DTaP/Tdap/Td  Discontinued   Pneumonia Vaccine 18+ Years old  Discontinued   COLONOSCOPY (Pts 45-23yr Insurance coverage will need to be confirmed)  Discontinued    Health Maintenance  Health Maintenance Due  Topic Date Due   COVID-19 Vaccine (6 - 2023-24 season) 07/20/2022    Colorectal cancer screening: Type of screening: Colonoscopy. Completed 10/24/16. Repeat every as directed  years   Additional Screening:  Hepatitis C Screening:  Completed 10/01/20  Vision Screening: Recommended annual ophthalmology exams for early detection of glaucoma and other disorders of the  eye. Is the patient up to date with their annual eye exam?  Yes  Who is the provider or what is the name of the office in which the patient attends annual eye exams? Dr Herbert Deaner  If pt is not established with a provider, would they like to be referred to a provider to establish care? No .   Dental Screening: Recommended annual dental exams for proper oral hygiene  Community Resource Referral / Chronic Care Management: CRR required this visit?  No   CCM required this visit?  No      Plan:     I have personally reviewed and noted the following in the patient's chart:   Medical and social history Use of alcohol,  tobacco or illicit drugs  Current medications and supplements including opioid prescriptions. Patient is not currently taking opioid prescriptions. Functional ability and status Nutritional status Physical activity Advanced directives List of other physicians Hospitalizations, surgeries, and ER visits in previous 12 months Vitals Screenings to include cognitive, depression, and falls Referrals and appointments  In addition, I have reviewed and discussed with patient certain preventive protocols, quality metrics, and best practice recommendations. A written personalized care plan for preventive services as well as general preventive health recommendations were provided to patient.     Willette Brace, LPN   [id number]   Nurse Notes:  Pt stated he is still following Dr R. Powa for colonoscopy.

## 2022-09-07 ENCOUNTER — Other Ambulatory Visit: Payer: Self-pay | Admitting: Family Medicine

## 2022-09-16 ENCOUNTER — Telehealth: Payer: Self-pay | Admitting: Pharmacist

## 2022-09-16 NOTE — Progress Notes (Addendum)
Care Management & Coordination Services Pharmacy Team  Reason for Encounter: Diabetes  Attempted to contact patient on 09/20/2022 to discuss diabetes disease state.   Recent office visits:  None  Recent consult visits:  None  Hospital visits:  None in previous 6 months  Medications: Outpatient Encounter Medications as of 09/16/2022  Medication Sig   aspirin EC 81 MG tablet Take 81 mg by mouth daily. Swallow whole.   B Complex Vitamins (VITAMIN B COMPLEX) TABS Take by mouth.   Cholecalciferol (VITAMIN D3 PO) Take 1 capsule by mouth daily.   colchicine 0.6 MG tablet Take 1 tablet (0.6 mg total) by mouth daily.   Continuous Blood Gluc Sensor (FREESTYLE LIBRE 3 SENSOR) MISC Quantity: 2 sensors/month NDC# [phone]. [id number]   CONTOUR NEXT TEST test strip    dapagliflozin propanediol (FARXIGA) 5 MG TABS tablet Take 1 tablet (5 mg total) by mouth daily before breakfast. (Patient not taking: Reported on 08/25/2022)   ENTRESTO 97-103 MG TAKE 1 TABLET BY MOUTH TWICE DAILY   Evolocumab (REPATHA SURECLICK) 778 MG/ML SOAJ ADMINISTER 1 ML(140 MG) UNDER THE SKIN EVERY 14 DAYS   furosemide (LASIX) 20 MG tablet Take 1 tablet (20 mg total) by mouth daily as needed for edema.   gabapentin (NEURONTIN) 100 MG capsule Take 2-3 capsules (200-300 mg total) by mouth at bedtime.   hydroxychloroquine (PLAQUENIL) 200 MG tablet Take 200 mg by mouth 2 (two) times daily.   insulin glargine, 2 Unit Dial, (TOUJEO MAX SOLOSTAR) 300 UNIT/ML Solostar Pen Inject 10-20 Units into the skin daily. Please provide pen needles   Insulin Pen Needle (B-D ULTRAFINE III SHORT PEN) 31G X 8 MM MISC USE AS DIRECTED TO INJECT BASAGLAR EVERY DAY   metoprolol succinate (TOPROL-XL) 50 MG 24 hr tablet Take 1 tablet (50 mg total) by mouth daily.   nitroGLYCERIN (NITROSTAT) 0.4 MG SL tablet Place 1 tablet (0.4 mg total) under the tongue every 5 (five) minutes as needed for chest pain.   triamcinolone (KENALOG) 0.1 % 1 application to  affected area   No facility-administered encounter medications on file as of 09/16/2022.    Recent Relevant Labs: Lab Results  Component Value Date/Time   HGBA1C 7.3 (H) 07/04/2022 03:10 PM   HGBA1C 8.1 (H) 01/17/2022 04:36 PM   MICROALBUR 1.0 07/12/2022 02:05 PM   MICROALBUR <0.7 10/01/2020 09:53 AM    Kidney Function Lab Results  Component Value Date/Time   CREATININE 1.22 07/04/2022 03:10 PM   CREATININE 1.26 01/17/2022 04:36 PM   GFR 57.31 (L) 07/04/2022 03:10 PM   GFRNONAA 58 (L) 12/24/2019 04:05 PM   GFRAA 67 12/24/2019 04:05 PM    Current antihyperglycemic regimen:  Farxiga 5 mg daily Toujeo 20 units daily   Patient verbally confirms he is taking the above medications as directed.   Unsuccessful attempt at reaching patient to complete this call.  Star Rating Drugs:  Farxiga 5 mg last filled 08/24/2022 30 DS Toujeo last filled 08/15/2022 68 DS Metformin ER 500 mg last filled 08/21/2022 90 DS Entresto 97-103 mg last filled 07/26/2022 90 DS   Care Gaps: Annual wellness visit in last year? Yes Last eye exam / retinopathy screening: 05/26/2022 Last diabetic foot exam: 07/04/2022   Future Appointments  Date Time Provider [place]  09/22/2022  8:20 AM Buford Dresser, MD [facility]  10/10/2022 11:20 AM Marin Olp, MD [facility]  06/08/2023  9:30 AM [facility] 1 [facility]  09/01/2023 11:45 AM [facility] HEALTH COACH [facility]  April D Calhoun, [place] Pharmacist Assistant [phone]

## 2022-09-19 DIAGNOSIS — C44329 Squamous cell carcinoma of skin of other parts of face: Secondary | ICD-10-CM | POA: Diagnosis not present

## 2022-09-21 NOTE — Progress Notes (Signed)
Cardiology Office Note:    Date:  09/22/2022   ID:  Casey Lynch, DOB Apr 21, 1946, MRN [id number]  PCP:  Marin Olp, MD  Cardiologist:  Buford Dresser, MD  Referring MD: Marin Olp, MD   CC: follow up  History of Present Illness:    Casey Lynch is a 77 y.o. male with a hx of with CAD, chronic systolic and diastolic heart failure, ischemic cardiomyopathy, hyperlipidemia, PAD (carotid bruit <50% bilaterally, left subclavian steal syndrome), COPD, former tobacco abuse, OSA on CPAP, type II diabetes, here for follow up. I met him 07/11/2018 and his initial cardiac evaluation from [place] is summarized in that note.  At his last appointment, he complained of shortness of breath especially with walking uphill. It was thought this may be angina. We planned for him to try taking nitro prior to walking to see if this improves his symptoms.  Today, he is accompanied by his wife. He states he is feeling well. He has not yet tried taking nitro prior to walking. Occasionally he is short of breath with walking uphill, but he feels it has been manageable lately.  His blood pressure has been stable at home, and is controlled in clinic at 130/64.  He is taking Lasix daily, which is helping. Currently feels as though his hands are a little swollen.  Lately he still struggles with diabetes management.  He denies any palpitations, or chest pain. No lightheadedness, headaches, syncope, orthopnea, or PND.   Past Medical History:  Diagnosis Date   Carotid bruit    Diabetes mellitus without complication (HCC)    Eczema    Heart failure ([place])    History of colonic polyps    Hypercholesterolemia    Hyperlipidemia    Macular degeneration    Osteoarthritis    Paresthesias    Pseudogout    Subclavian steal syndrome     Past Surgical History:  Procedure Laterality Date   CARDIAC CATHETERIZATION     COLONOSCOPY WITH PROPOFOL N/A 10/24/2016   Procedure: COLONOSCOPY WITH  PROPOFOL;  Surgeon: Garlan Fair, MD;  Location: [facility];  Service: Endoscopy;  Laterality: N/A;   HYDROCELE EXCISION / REPAIR     KNEE SURGERY     REFRACTIVE SURGERY     TONSILLECTOMY      Current Medications: Current Outpatient Medications on File Prior to Visit  Medication Sig   aspirin EC 81 MG tablet Take 81 mg by mouth daily. Swallow whole.   B Complex Vitamins (VITAMIN B COMPLEX) TABS Take by mouth.   Cholecalciferol (VITAMIN D3 PO) Take 1 capsule by mouth daily.   colchicine 0.6 MG tablet Take 1 tablet (0.6 mg total) by mouth daily.   Continuous Blood Gluc Sensor (FREESTYLE LIBRE 3 SENSOR) MISC Quantity: 2 sensors/month NDC# [phone]. [id number]   CONTOUR NEXT TEST test strip    dapagliflozin propanediol (FARXIGA) 5 MG TABS tablet Take 1 tablet (5 mg total) by mouth daily before breakfast.   doxycycline (VIBRAMYCIN) 100 MG capsule Take 100 mg by mouth 2 (two) times daily.   ENTRESTO 97-103 MG TAKE 1 TABLET BY MOUTH TWICE DAILY   Evolocumab (REPATHA SURECLICK) 195 MG/ML SOAJ ADMINISTER 1 ML(140 MG) UNDER THE SKIN EVERY 14 DAYS   furosemide (LASIX) 20 MG tablet Take 1 tablet (20 mg total) by mouth daily as needed for edema.   gabapentin (NEURONTIN) 100 MG capsule Take 2-3 capsules (200-300 mg total) by mouth at bedtime.   hydroxychloroquine (PLAQUENIL) 200 MG tablet Take  200 mg by mouth 2 (two) times daily.   insulin glargine, 2 Unit Dial, (TOUJEO MAX SOLOSTAR) 300 UNIT/ML Solostar Pen Inject 10-20 Units into the skin daily. Please provide pen needles   Insulin Pen Needle (B-D ULTRAFINE III SHORT PEN) 31G X 8 MM MISC USE AS DIRECTED TO INJECT BASAGLAR EVERY DAY   metoprolol succinate (TOPROL-XL) 50 MG 24 hr tablet Take 1 tablet (50 mg total) by mouth daily.   triamcinolone (KENALOG) 0.1 % 1 application to affected area   nitroGLYCERIN (NITROSTAT) 0.4 MG SL tablet Place 1 tablet (0.4 mg total) under the tongue every 5 (five) minutes as needed for chest pain.   No current  facility-administered medications on file prior to visit.     Allergies:   Jardiance [empagliflozin], Statins, and Tetanus toxoids   Social History   Tobacco Use   Smoking status: Former    Packs/day: 1.00    Years: 59.00    Total pack years: 59.00    Types: Cigarettes    Quit date: 09/05/2016    Years since quitting: 6.0   Smokeless tobacco: Never  Vaping Use   Vaping Use: Never used  Substance Use Topics   Alcohol use: Not Currently    Comment: Rare   Drug use: No    Family History: family history includes Bipolar disorder in his brother; Cancer in his paternal grandmother; Diabetes in his brother; Heart failure in his father.  ROS:   Please see the history of present illness.   (+) Exertional shortness of breath (+) Edema of bilateral hands Additional pertinent ROS otherwise unremarkable.  EKGs/Labs/Other Studies Reviewed:    The following studies were reviewed today:  CT Chest  06/06/2022: FINDINGS: Cardiovascular: Heart size is normal. No pericardial effusion. Coronary artery calcifications. Aortic atherosclerosis.  IMPRESSION: 1. Lung-RADS 1, negative. Continue annual screening with low-dose chest CT without contrast in 12 months. 2. Aortic Atherosclerosis (ICD10-I70.0) and Emphysema (ICD10-J43.9). 3. Coronary artery calcifications.  CT Chest  06/04/2021: FINDINGS: Cardiovascular: The heart size is normal. No substantial pericardial effusion. Coronary artery calcification is evident. Mild atherosclerotic calcification is noted in the wall of the thoracic aorta.   IMPRESSION: 1. Overall improvement in the relatively large region of ground-glass opacity in the peripheral right upper lobe. The associated nodular consolidative components are improved in the interval. 2. Possible tiny hypoattenuating lesion in the tail of pancreas with punctate wall calcification. This region was not included on any of the prior chest CTs. Consider abdominal MRI with and  without contrast for definitive characterization. 3. Aortic Atherosclerosis (ICD10-I70.0) and Emphysema (ICD10-J43.9).  Echo 08/18/20 1. Left ventricular ejection fraction, by estimation, is 50 to 55%. The  left ventricle has low normal function. The left ventricle has no regional  wall motion abnormalities. Left ventricular diastolic parameters are  consistent with Grade I diastolic  dysfunction (impaired relaxation).   2. Right ventricular systolic function is normal. The right ventricular  size is mildly enlarged.   3. The mitral valve is normal in structure. Trivial mitral valve  regurgitation. No evidence of mitral stenosis.   4. The aortic valve is tricuspid. Aortic valve regurgitation is not  visualized. No aortic stenosis is present.   5. The inferior vena cava is normal in size with greater than 50%  respiratory variability, suggesting right atrial pressure of 3 mmHg.  Stress test 08/14/20 Nuclear stress EF: 51%. The left ventricular ejection fraction is mildly decreased (45-54%). There was no ST segment deviation noted during stress. Findings consistent with  prior myocardial infarction. This is a low risk study.   Small inferior basal wall infarct no ischemia EF 51%   Echo 02/19/19  1. The left ventricle has mild-moderately reduced systolic function, with an ejection fraction of 40-45%. The cavity size was normal. Left ventricular diastolic Doppler parameters are consistent with impaired relaxation. Left ventricular diffuse  hypokinesis, with slightly worse function in the lateral wall.  2. The right ventricle has normal systolic function. The cavity was normal. There is no increase in right ventricular wall thickness. Right ventricular systolic pressure could not be assessed.  3. The aortic valve is tricuspid. Aortic valve regurgitation is trivial by color flow Doppler. No stenosis of the aortic valve.  4. Cannot exclude small PFO by color flow Doppler.  5. There is  redundancy of the interatrial septum.  EKG:  EKG was personally reviewed today 09/22/2022:  EKG was not ordered. 06/01/22: NSR at 74 bpm, RBBB 06/29/21: NSR, RBBB at 87 bpm 07/02/20: NSR, RBBB at 77 bpm  Recent Labs: 07/04/2022: ALT 18; BUN 22; Creatinine, Ser 1.22; Hemoglobin 12.3; Platelets 354.0; Potassium 4.5; Sodium 138   Recent Lipid Panel    Component Value Date/Time   CHOL 123 07/04/2022 1510   CHOL 120 12/24/2019 1605   TRIG (H) 07/04/2022 1510    460.0 Triglyceride is over 400; calculations on Lipids are invalid.   HDL 39.70 07/04/2022 1510   HDL 48 12/24/2019 1605   CHOLHDL 3 07/04/2022 1510   VLDL 52.4 (H) 06/23/2021 0806   LDLCALC 31 12/24/2019 1605   LDLDIRECT 47.0 07/04/2022 1510    Physical Exam:    VS:  BP 130/64 (BP Location: Right Arm, Patient Position: Sitting, Cuff Size: Normal)   Pulse 70   Ht '5\' 6"'$  (1.676 m)   Wt 161 lb 12.8 oz (73.4 kg)   SpO2 96%   BMI 26.12 kg/m     Wt Readings from Last 3 Encounters:  09/22/22 161 lb 12.8 oz (73.4 kg)  08/25/22 162 lb (73.5 kg)  07/04/22 170 lb 3.2 oz (77.2 kg)   GEN: Well nourished, well developed in no acute distress HEENT: Normal, moist mucous membranes; Bandaged lesion of left temple. NECK: No JVD CARDIAC: regular rhythm, normal S1 and S2, no rubs or gallops. No murmur. VASCULAR: Radial and DP pulses 2+ bilaterally. No carotid bruits RESPIRATORY:  Clear to auscultation without rales, wheezing or rhonchi  ABDOMEN: Soft, non-tender, non-distended MUSCULOSKELETAL:  Ambulates independently SKIN: Warm and dry, no LE edema NEUROLOGIC:  Alert and oriented x 3. No focal neuro deficits noted. PSYCHIATRIC:  Normal affect    ASSESSMENT:    1. Dyspnea on exertion   2. Coronary artery disease of native artery of native heart with stable angina pectoris ([place])   3. Ischemic cardiomyopathy   4. Chronic combined systolic and diastolic CHF (congestive heart failure) ([place])   5. Type 2 diabetes mellitus with other  circulatory complication, with long-term current use of insulin (HCC)   6. Statin intolerance   7. Mixed hyperlipidemia     PLAN:    Dyspnea on exertion: -nonlimiting, improved recently, but with the weather he is not golfing -may be angina. If symptoms worsen, he will try taking nitro prior to walking to see if this improves his symptoms. 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:   -NYHA class II, EF improved to 50-55% on 08/2020 echo (was 40-45%) -tolerating maximum dose  entresto, continue -tolerating metoprolol succinate 50 mg daily for stable angina -did not tolerate spironolactone per report -did not tolerate jardiance; now tolerating farxiga   CAD (predominantly RCA, but diffuse mild disease), with stable angina: -tolerating metoprolol succinate 50 mg daily for stable angina -on aspirin 81 mg -avoid long term NSAIDs given CAD -lipids as below -discussed red flag warning signs that need immediate medical attention, such as chest tightness that does not resolve with rest  Mixed hyperlipidemia, statin intolerance: -LDL goal <70 -doing well on repatha, last LDL 47 -could not afford vascepa at >$500 -last TG 460 but unclear if fasting, but focusing on lifestyle/diabetes control. Continue to monitor   Type II diabetes, on insulin -has neuropathy and vascular disease as  diabetic complication -last O9G 7.3 -has had some weight gain on glargine -tolerating Farxiga  Cardiac risk counseling and prevention recommendations: -recommend heart healthy/Mediterranean diet, with whole grains, fruits, vegetable, fish, lean meats, nuts, and olive oil. Limit salt. -recommend moderate walking, 3-5 times/week for 30-50 minutes each session. Aim for at least 150 minutes.week. Goal should be pace of 3 miles/hours, or walking 1.5 miles in 30 minutes -recommend avoidance of  tobacco products. Avoid excess alcohol.  Plan for follow up: 5 months (June 2024), or sooner as needed.   Buford Dresser, MD, PhD, [facility]   Medication Adjustments/Labs and Tests Ordered: Current medicines are reviewed at length with the patient today.  Concerns regarding medicines are outlined above.   No orders of the defined types were placed in this encounter.  No orders of the defined types were placed in this encounter.  Patient Instructions  Medication Instructions:  Your physician recommends that you continue on your current medications as directed. Please refer to the Current Medication list given to you today.  *If you need a refill on your cardiac medications before your next appointment, please call your pharmacy*  Lab Work: NONE  Testing/Procedures: NONE  Follow-Up: At [facility], you and your health needs are our priority.  As part of our continuing mission to provide you with exceptional heart care, we have created designated Provider Care Teams.  These Care Teams include your primary Cardiologist (physician) and Advanced Practice Providers (APPs -  Physician Assistants and Nurse Practitioners) who all work together to provide you with the care you need, when you need it.  We recommend signing up for the patient portal called "MyChart".  Sign up information is provided on this After Visit Summary.  MyChart is used to connect with patients for Virtual Visits (Telemedicine).  Patients are able to view lab/test results, encounter notes, upcoming appointments, etc.  Non-urgent messages can be sent to your provider as well.   To learn more about what you can do with MyChart, go to [email or web address].    Your next appointment:   In June   The format for your next appointment:   In Person  Provider:   Buford Dresser, MD        [facility],acting as a scribe for Buford Dresser, MD.,have documented  all relevant documentation on the behalf of Buford Dresser, MD,as directed by  Buford Dresser, MD while in the presence of Buford Dresser, MD.  I, Buford Dresser, MD, have reviewed all documentation for this visit. The documentation on 09/22/22 for the exam, diagnosis, procedures, and orders are all accurate and complete.   Signed, Buford Dresser, MD PhD 09/22/2022    [place]

## 2022-09-22 ENCOUNTER — Ambulatory Visit (HOSPITAL_BASED_OUTPATIENT_CLINIC_OR_DEPARTMENT_OTHER): Payer: Medicare Other | Admitting: Cardiology

## 2022-09-22 ENCOUNTER — Encounter (HOSPITAL_BASED_OUTPATIENT_CLINIC_OR_DEPARTMENT_OTHER): Payer: Self-pay | Admitting: Cardiology

## 2022-09-22 VITALS — BP 130/64 | HR 70 | Ht 66.0 in | Wt 161.8 lb

## 2022-09-22 DIAGNOSIS — I25118 Atherosclerotic heart disease of native coronary artery with other forms of angina pectoris: Secondary | ICD-10-CM | POA: Diagnosis not present

## 2022-09-22 DIAGNOSIS — I5042 Chronic combined systolic (congestive) and diastolic (congestive) heart failure: Secondary | ICD-10-CM

## 2022-09-22 DIAGNOSIS — Z794 Long term (current) use of insulin: Secondary | ICD-10-CM

## 2022-09-22 DIAGNOSIS — R0609 Other forms of dyspnea: Secondary | ICD-10-CM

## 2022-09-22 DIAGNOSIS — E1159 Type 2 diabetes mellitus with other circulatory complications: Secondary | ICD-10-CM

## 2022-09-22 DIAGNOSIS — E782 Mixed hyperlipidemia: Secondary | ICD-10-CM

## 2022-09-22 DIAGNOSIS — I255 Ischemic cardiomyopathy: Secondary | ICD-10-CM | POA: Diagnosis not present

## 2022-09-22 DIAGNOSIS — Z789 Other specified health status: Secondary | ICD-10-CM

## 2022-09-22 NOTE — Patient Instructions (Signed)
Medication Instructions:  Your physician recommends that you continue on your current medications as directed. Please refer to the Current Medication list given to you today.  *If you need a refill on your cardiac medications before your next appointment, please call your pharmacy*  Lab Work: NONE  Testing/Procedures: NONE  Follow-Up: At [facility], you and your health needs are our priority.  As part of our continuing mission to provide you with exceptional heart care, we have created designated Provider Care Teams.  These Care Teams include your primary Cardiologist (physician) and Advanced Practice Providers (APPs -  Physician Assistants and Nurse Practitioners) who all work together to provide you with the care you need, when you need it.  We recommend signing up for the patient portal called "MyChart".  Sign up information is provided on this After Visit Summary.  MyChart is used to connect with patients for Virtual Visits (Telemedicine).  Patients are able to view lab/test results, encounter notes, upcoming appointments, etc.  Non-urgent messages can be sent to your provider as well.   To learn more about what you can do with MyChart, go to [email or web address].    Your next appointment:   In June   The format for your next appointment:   In Person  Provider:   Buford Dresser, MD

## 2022-09-27 DIAGNOSIS — L821 Other seborrheic keratosis: Secondary | ICD-10-CM | POA: Diagnosis not present

## 2022-09-27 DIAGNOSIS — L812 Freckles: Secondary | ICD-10-CM | POA: Diagnosis not present

## 2022-09-27 DIAGNOSIS — L308 Other specified dermatitis: Secondary | ICD-10-CM | POA: Diagnosis not present

## 2022-09-27 DIAGNOSIS — L570 Actinic keratosis: Secondary | ICD-10-CM | POA: Diagnosis not present

## 2022-09-27 DIAGNOSIS — D225 Melanocytic nevi of trunk: Secondary | ICD-10-CM | POA: Diagnosis not present

## 2022-10-03 NOTE — Progress Notes (Incomplete)
Care Management & Coordination Services Pharmacy Note  10/03/2022 Name:  Casey Lynch MRN:  [id number] DOB:  1946/04/02  Summary: ***  Recommendations/Changes made from today's visit: ***  Follow up plan: ***   Subjective: Casey Lynch is an 77 y.o. year old male who is a primary patient of Yong Channel, Brayton Mars, MD.  The care coordination team was consulted for assistance with disease management and care coordination needs.    {CCMTELEPHONEFACETOFACE:21091510} for {CCMINITIALFOLLOWUPCHOICE:21091511}.  Recent office visits: ***  Recent consult visits: ***  Hospital visits: {Hospital DC Yes/No:25215}   Objective:  Lab Results  Component Value Date   CREATININE 1.22 07/04/2022   BUN 22 07/04/2022   GFR 57.31 (L) 07/04/2022   GFRNONAA 58 (L) 12/24/2019   GFRAA 67 12/24/2019   NA 138 07/04/2022   K 4.5 07/04/2022   CALCIUM 10.0 07/04/2022   CO2 28 07/04/2022   GLUCOSE 125 (H) 07/04/2022    Lab Results  Component Value Date/Time   HGBA1C 7.3 (H) 07/04/2022 03:10 PM   HGBA1C 8.1 (H) 01/17/2022 04:36 PM   GFR 57.31 (L) 07/04/2022 03:10 PM   GFR 55.31 (L) 01/17/2022 04:36 PM   MICROALBUR 1.0 07/12/2022 02:05 PM   MICROALBUR <0.7 10/01/2020 09:53 AM    Last diabetic Eye exam:  Lab Results  Component Value Date/Time   HMDIABEYEEXA Retinopathy (A) 05/26/2022 12:00 AM    Last diabetic Foot exam: No results found for: "HMDIABFOOTEX"   Lab Results  Component Value Date   CHOL 123 07/04/2022   HDL 39.70 07/04/2022   LDLCALC 31 12/24/2019   LDLDIRECT 47.0 07/04/2022   TRIG (H) 07/04/2022    460.0 Triglyceride is over 400; calculations on Lipids are invalid.   CHOLHDL 3 07/04/2022       Latest Ref Rng & Units 07/04/2022    3:10 PM 01/17/2022    4:36 PM 10/14/2021   10:04 AM  Hepatic Function  Total Protein 6.0 - 8.3 g/dL 6.6  6.8  7.0   Albumin 3.5 - 5.2 g/dL 4.2  4.1  4.0   AST 0 - 37 U/L '17  18  16   '$ ALT 0 - 53 U/L '18  20  18   '$ Alk Phosphatase 39 - 117  U/L 82  82  110   Total Bilirubin 0.2 - 1.2 mg/dL 0.3  0.2  0.4     Lab Results  Component Value Date/Time   TSH 4.47 10/01/2020 09:53 AM       Latest Ref Rng & Units 07/04/2022    3:10 PM 01/17/2022    4:36 PM 10/14/2021   10:04 AM  CBC  WBC 4.0 - 10.5 K/uL 8.8  8.8  9.9   Hemoglobin 13.0 - 17.0 g/dL 12.3  12.3  12.6   Hematocrit 39.0 - 52.0 % 36.6  36.8  37.8   Platelets 150.0 - 400.0 K/uL 354.0  335.0  319.0     No results found for: "VD25OH", "VITAMINB12"  Clinical ASCVD: {YES/NO:21197} The ASCVD Risk score (Arnett DK, et al., 2019) failed to calculate for the following reasons:   The valid total cholesterol range is 130 to 320 mg/dL    ***Other: (CHADS2VASc if Afib, MMRC or CAT for COPD, ACT, DEXA)     08/25/2022   11:36 AM 08/16/2021   11:39 AM 06/21/2021    2:26 PM  Depression screen PHQ 2/9  Decreased Interest 0 0 0  Down, Depressed, Hopeless 0 0 0  PHQ - 2 Score 0 0  0     Social History   Tobacco Use  Smoking Status Former   Packs/day: 1.00   Years: 59.00   Total pack years: 59.00   Types: Cigarettes   Quit date: 09/05/2016   Years since quitting: 6.0  Smokeless Tobacco Never   BP Readings from Last 3 Encounters:  09/22/22 130/64  07/04/22 104/62  06/07/22 110/60   Pulse Readings from Last 3 Encounters:  09/22/22 70  07/04/22 75  06/07/22 73   Wt Readings from Last 3 Encounters:  09/22/22 161 lb 12.8 oz (73.4 kg)  08/25/22 162 lb (73.5 kg)  07/04/22 170 lb 3.2 oz (77.2 kg)   BMI Readings from Last 3 Encounters:  09/22/22 26.12 kg/m  08/25/22 26.15 kg/m  07/04/22 27.47 kg/m    Allergies  Allergen Reactions   Jardiance [Empagliflozin] Other (See Comments)    Pain and stiffness   Statins Other (See Comments)    Terrible joint pain   Tetanus Toxoids Other (See Comments)    Fever 104/105 & arm stiffiness    Medications Reviewed Today     Reviewed by Buford Dresser, MD (Physician) on 09/22/22 at 0859  Med List Status:  <None>   Medication Order Taking? Sig Documenting Provider Last Dose Status Informant  aspirin EC 81 MG tablet [id number] Yes Take 81 mg by mouth daily. Swallow whole. [provider] Taking Active   B Complex Vitamins (VITAMIN B COMPLEX) TABS [id number] Yes Take by mouth. [provider] Taking Active   Cholecalciferol (VITAMIN D3 PO) [id number] Yes Take 1 capsule by mouth daily. [provider] Taking Active   colchicine 0.6 MG tablet [id number] Yes Take 1 tablet (0.6 mg total) by mouth daily. Orma Flaming, MD Taking Active   Continuous Blood Gluc Sensor (FREESTYLE [place] 3 SENSOR) [place] [id number] Yes Quantity: 2 sensors/month NDC# [phone]. [id number] Marin Olp, MD Taking Active   CONTOUR NEXT TEST test strip [id number] Yes  [provider] Taking Active   dapagliflozin propanediol (FARXIGA) 5 MG TABS tablet [id number] Yes Take 1 tablet (5 mg total) by mouth daily before breakfast. Marin Olp, MD Taking Active   doxycycline (VIBRAMYCIN) 100 MG capsule [id number] Yes Take 100 mg by mouth 2 (two) times daily. [provider] Taking Active   ENTRESTO 97-103 MG [id number] Yes TAKE 1 TABLET BY MOUTH TWICE DAILY Buford Dresser, MD Taking Active   Evolocumab [facility] SURECLICK) 948 MG/ML Darden Palmer [id number] Yes ADMINISTER 1 ML(140 MG) UNDER THE SKIN EVERY 14 DAYS Buford Dresser, MD Taking Active   furosemide (LASIX) 20 MG tablet [id number] Yes Take 1 tablet (20 mg total) by mouth daily as needed for edema. Buford Dresser, MD Taking Active   gabapentin (NEURONTIN) 100 MG capsule [id number] Yes Take 2-3 capsules (200-300 mg total) by mouth at bedtime. Marin Olp, MD Taking Active   hydroxychloroquine (PLAQUENIL) 200 MG tablet [id number] Yes Take 200 mg by mouth 2 (two) times daily. [provider] Taking Active Self  insulin glargine, 2 Unit Dial, (TOUJEO MAX SOLOSTAR) 300 UNIT/ML Solostar Pen [id number] Yes Inject 10-20  Units into the skin daily. Please provide pen needles Marin Olp, MD Taking Active   Insulin Pen Needle (B-D ULTRAFINE III SHORT PEN) 31G X 8 MM MISC [id number] Yes USE AS DIRECTED TO INJECT BASAGLAR EVERY DAY Marin Olp, MD Taking Active   metoprolol succinate (TOPROL-XL) 50 MG 24 hr tablet [id number] Yes Take 1 tablet (50 mg total) by mouth daily. Buford Dresser, MD Taking Active  nitroGLYCERIN (NITROSTAT) 0.4 MG SL tablet [id number]  Place 1 tablet (0.4 mg total) under the tongue every 5 (five) minutes as needed for chest pain. Buford Dresser, MD  Expired 12/26/21 2359   triamcinolone (KENALOG) 0.1 % [id number] Yes 1 application to affected area [provider] Taking Active             SDOH:  (Social Determinants of Health) assessments and interventions performed: {yes/no:20286} SDOH Interventions    Flowsheet Row Clinical Support from 08/25/2022 in [place] Interventions Intervention Not Indicated  Housing Interventions Intervention Not Indicated  Transportation Interventions Intervention Not Indicated  Financial Strain Interventions Intervention Not Indicated  Physical Activity Interventions Intervention Not Indicated  Stress Interventions Intervention Not Indicated  Social Connections Interventions Intervention Not Indicated       Medication Assistance: {MEDASSISTANCEINFO:25044}  Medication Access: Within the past 30 days, how often has patient missed a dose of medication? *** Is a pillbox or other method used to improve adherence? {YES/NO:21197} Factors that may affect medication adherence? {CHL DESC; BARRIERS:21522} Are meds synced by current pharmacy? {YES/NO:21197} Are meds delivered by current pharmacy? {YES/NO:21197} Does patient experience delays in picking up medications due to transportation concerns? {YES/NO:21197}  [facility] Reviewed: Is patient  disadvantaged to use [facility]?: {YES/NO:21197} Current Rx insurance plan: *** Name and location of Current pharmacy:  [facility] [place], [place] [address] [address] Phone: [phone] Fax: [phone]  [facility] ([facility] [place], [place] [address] Phone: [phone] Fax: [phone]  [facility] services reviewed with patient today?: {YES/NO:21197} Patient requests to transfer care to [facility]?: {YES/NO:21197} Reason patient declined to change pharmacies: {US patient preference:27474}  Compliance/Adherence/Medication fill history: Care Gaps: ***  Star-Rating Drugs: ***   Assessment/Plan                  Current Barriers:  Uncontrolled glucose Neuropathy  Pharmacist Clinical Goal(s):  Patient will achieve improvement in A1c and neuropathy symptoms as evidenced by A1c/monitoring through collaboration with PharmD and provider.   Interventions: 1:1 collaboration with Marin Olp, MD regarding development and update of comprehensive plan of care as evidenced by provider attestation and co-signature Inter-disciplinary care team collaboration (see longitudinal plan of care) Comprehensive medication review performed; medication list updated in electronic medical record  Hyperlipidemia/CAD: (LDL goal < 70) -Controlled -Current treatment: Repatha '140mg'$ /ml every 14 days ASA '81mg'$  -Medications previously tried: statins (terrible joint pains)  -Current dietary patterns: he eats "a little bit of everything" per his report, son owns [employer] so he goes out to eat there at least once or twice per week. -Current exercise habits: tries to walk 10k steps per day, will go over to [employer] and walk with his wife and dog -Educated on Cholesterol goals;   Importance of limiting foods high in cholesterol; Exercise goal of 150 minutes per week; -Recommended to continue current medication Assessed patient finances. Gets Repatha through [employer], continue current medications he is happy with them and are working well.  Diabetes (A1c goal <7%) -Uncontrolled -Current medications: Metformin XR '500mg'$  BID Jardiance '10mg'$  daily - he has stopped taking Basaglar 10 units daily -Medications previously tried: Jardiance (stiffness)  -Current home glucose readings fasting glucose: 120-243 - with the majority being around 150-160. post  prandial glucose: not checking -Denies hypoglycemic/hyperglycemic symptoms -Current meal patterns:  He eats a lot of soups at home, does eat out at least once per week at his sons restaurant -Current exercise: walking - goal of 10k steps per day -Educated on A1c and blood sugar goals; Complications of diabetes including kidney damage, retinal damage, and cardiovascular disease; Exercise goal of 150 minutes per week; Prevention and management of hypoglycemic episodes; Benefits of routine self-monitoring of blood sugar; He does mention some neuropathy in his feet mainly at night - he was interested in trying medication to help this. -Counseled to check feet daily and get yearly eye exams -Counseled on diet and exercise extensively Recommended he limit carbohydrates and late night sweets.  Suggested handful of nuts if he needed a night time snack.  Consider increasing insulin to 15 units daily.  Continue regular monitoring at home - has visit in January for A1c recheck.  Will also discuss initiation of gabapentin at night for neuropathy.  Heart Failure (Goal: manage symptoms and prevent exacerbations) -Controlled -Last ejection fraction: 50-55%  -HF type: Systolic -NYHA Class: I (no actitivty limitation) -AHA HF Stage: B (Heart disease present - no symptoms present) -Current treatment: Toprolol XL '25mg'$   daily Furosemide '20mg'$  daily Entresto 97-103 mg BID -Medications previously tried: none noted  -Current home BP/HR readings: "normal" he does not check but reports no symptoms -Current dietary habits: see above -Current exercise habits: see above -Educated on Benefits of medications for managing symptoms and prolonging life Importance of weighing daily; if you gain more than 3 pounds in one day or 5 pounds in one week, contact providers Importance of blood pressure control -Recommended to continue current medication He denies any swelling, minimal SOB.  Delene Loll is affordable, however discussed possible PAP if he enters donut hole next year/  Patient Goals/Self-Care Activities Patient will:  - take medications as prescribed as evidenced by patient report and record review check glucose daily, document, and provide at future appointments target a minimum of 150 minutes of moderate intensity exercise weekly engage in dietary modifications by limiting carbohydrates and excess sugars.  Follow Up Plan: The care management team will reach out to the patient again over the next 120 days.       Beverly Milch, PharmD Clinical Pharmacist  [facility] [phone]

## 2022-10-10 ENCOUNTER — Encounter: Payer: Self-pay | Admitting: Family Medicine

## 2022-10-10 ENCOUNTER — Ambulatory Visit (INDEPENDENT_AMBULATORY_CARE_PROVIDER_SITE_OTHER): Payer: Medicare Other | Admitting: Family Medicine

## 2022-10-10 VITALS — BP 120/64 | HR 75 | Temp 97.4°F | Ht 66.0 in | Wt 171.4 lb

## 2022-10-10 DIAGNOSIS — E1165 Type 2 diabetes mellitus with hyperglycemia: Secondary | ICD-10-CM

## 2022-10-10 DIAGNOSIS — I5042 Chronic combined systolic (congestive) and diastolic (congestive) heart failure: Secondary | ICD-10-CM

## 2022-10-10 DIAGNOSIS — G720 Drug-induced myopathy: Secondary | ICD-10-CM

## 2022-10-10 LAB — COMPREHENSIVE METABOLIC PANEL
ALT: 21 U/L (ref 0–53)
AST: 16 U/L (ref 0–37)
Albumin: 4.2 g/dL (ref 3.5–5.2)
Alkaline Phosphatase: 95 U/L (ref 39–117)
BUN: 22 mg/dL (ref 6–23)
CO2: 29 mEq/L (ref 19–32)
Calcium: 9.7 mg/dL (ref 8.4–10.5)
Chloride: 98 mEq/L (ref 96–112)
Creatinine, Ser: 1.47 mg/dL (ref 0.40–1.50)
GFR: 45.73 mL/min — ABNORMAL LOW (ref >60.00)
Glucose, Bld: 182 mg/dL — ABNORMAL HIGH (ref 70–99)
Potassium: 4.7 mEq/L (ref 3.5–5.1)
Sodium: 137 mEq/L (ref 135–145)
Total Bilirubin: 0.4 mg/dL (ref 0.2–1.2)
Total Protein: 6.9 g/dL (ref 6.0–8.3)

## 2022-10-10 LAB — HEMOGLOBIN A1C: Hgb A1c MFr Bld: 7.7 % — ABNORMAL HIGH (ref 4.6–6.5)

## 2022-10-10 MED ORDER — DAPAGLIFLOZIN PROPANEDIOL 10 MG PO TABS: 10.0000 mg | ORAL_TABLET | Freq: Every day | ORAL | 5 refills | Status: DC

## 2022-10-10 NOTE — Patient Instructions (Addendum)
Diabetes does not sound ideally controlled though we will check a1c today. We opted to increase farxiga to 10 mg with backup option of 1) metformin '500mg'$  ER or 2) increase insulin if a1c is above 7  Please stop by lab before you go If you have mychart- we will send your results within 3 business days of [place] receiving them.  If you do not have mychart- we will call you about results within 5 business days of [place] receiving them.  *please also note that you will see labs on mychart as soon as they post. I will later go in and write notes on them- will say "notes from Dr. Yong Channel"   [place] visit with pharmacy tomorrow on way out  Recommended follow up: Return in about 14 weeks (around 01/16/2023) for followup or sooner if needed.Schedule b4 you leave.

## 2022-10-10 NOTE — Progress Notes (Signed)
Phone [phone] In person visit   Subjective:   Casey Lynch is a 77 y.o. year old very pleasant male patient who presents for/with See problem oriented charting Chief Complaint  Patient presents with   Follow-up   Diabetes    Pt states Wilder Glade is not doing the job, the highest he has had has been 190 and lowest being 120, this morning was 158.    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 Swallow whole.     B Complex Vitamins (VITAMIN B COMPLEX) TABS Take by mouth.     Cholecalciferol (VITAMIN D3 PO) Take 1 capsule by mouth daily.     colchicine 0.6 MG tablet Take 1 tablet (0.6 mg total) by mouth daily. 90 tablet 0   Continuous Blood Gluc Sensor (FREESTYLE [place] 3 SENSOR) MISC Quantity: 2 sensors/month NDC# [phone]. [id number] 2 each 12   CONTOUR NEXT TEST test strip      dapagliflozin propanediol (FARXIGA) 10 MG TABS tablet Take 1 tablet (10 mg total) by mouth daily. 30 tablet 5   ENTRESTO 97-103 MG TAKE 1 TABLET BY MOUTH TWICE DAILY 180  tablet 3   Evolocumab (REPATHA SURECLICK) 440 MG/ML SOAJ ADMINISTER 1 ML(140 MG) UNDER THE SKIN EVERY 14 DAYS 6 mL 3   furosemide (LASIX) 20 MG tablet Take 1 tablet (20 mg total) by mouth daily as needed for edema. 90 tablet 3   gabapentin (NEURONTIN) 100 MG capsule Take 2-3 capsules (200-300 mg total) by mouth at bedtime. 90 capsule 5   hydroxychloroquine (PLAQUENIL) 200 MG tablet Take 200 mg by mouth 2 (two) times daily.     insulin glargine, 2 Unit Dial, (TOUJEO MAX SOLOSTAR) 300 UNIT/ML Solostar Pen Inject 10-20 Units into the skin daily. Please provide pen needles 15 mL 5   Insulin Pen Needle (B-D ULTRAFINE III SHORT PEN) 31G X 8 MM MISC USE AS DIRECTED TO INJECT BASAGLAR EVERY DAY 100 each 3   metoprolol succinate (TOPROL-XL) 50 MG 24 hr tablet Take 1 tablet (50 mg total) by mouth daily. 90 tablet 3   triamcinolone (KENALOG) 0.1 % 1 application to affected area     doxycycline (VIBRAMYCIN) 100 MG capsule Take 100 mg by mouth 2 (two) times daily.     nitroGLYCERIN (NITROSTAT) 0.4 MG SL tablet Place 1 tablet (0.4 mg total) under the tongue every 5 (five) minutes as needed for chest pain. 90 tablet 3   No current facility-administered medications  for this visit.     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 S: Medication: Insulin basaglar (finishing up then toujeo) at 20 units, farxiga 5 mg - explosive diarrhea on metformin 1000 mg extended release twice daily -trulicity and ozempic with nausea and GERD - gabapentin '100mg'$ - '200mg'$  nightly for neuropathy- has been helpful  CBGs- sugars as high as 198 fasting to as low as 120, this morning was  158 and probably the average  Exercise and diet- getting 7k steps most days still Lab Results  Component Value Date   HGBA1C 7.3 (H) 07/04/2022   HGBA1C 8.1 (H) 01/17/2022   HGBA1C 9.1 (H) 10/14/2021   A/P: Diabetes does not sound ideally controlled though we will check a1c today. We opted to increase farxiga to 10 mg with backup option of 1) metformin '500mg'$  ER or 2) increase insulin if a1c is above 7. Reports a1c 7.2 in December with Dr. Trudie Reed- sugars higher since that time- will update a1c and cmp  Neuropathy stable- doing well with option to take '200mg'$  gabapentin  #CAD-follows with Dr. Holland Falling RCA but diffuse mild disease #Chronic systolic and diastolic heart failure related to ischemic cardiomyopathy in addition to nonischemic cardiomyopathy #hyperlipidemia with statin intolerance #Aortic atherosclerosis-incidental finding lung cancer screening   S: Medication:Aspirin 81 mg -For lipids on Repatha every 2 weeks -For heart failure on Entresto 97-103 mg, metoprolol extended release 50 mg, Lasix 20 mg daily as needed- mainly daily -Has not tolerated spironolactone in the past   -no chest pain or shortness of breath Lab Results  Component Value Date   CHOL 123 07/04/2022   HDL 39.70 07/04/2022   LDLCALC 31 12/24/2019   LDLDIRECT 47.0 07/04/2022   TRIG (H) 07/04/2022    460.0 Triglyceride is over 400; calculations on Lipids are invalid.   CHOLHDL 3 07/04/2022   A/P: CAD asymptomatic - continue current medications  HF- stable/euvolemic on above med-s continue current medications  Lipids at goal LDL under 70 last check- think triglycerides will come down with improved a1c. Statin intolerance noted Aortic atherosclerosis (presumed stable)- LDL goal ideally <70. Continue current medications  #COPD/former smoker S: medication: none No shortness of breath other than very mild with hills A/P: asymptomatic - continue without meds   #Seronegative RA per Dr. Trudie Reed-  has bene told psoriatic arthritis S: on hydroxychloroquine '200mg'$   for possible psoriatic arthritis, on turmeric.  Follows with Dr. Trudie Reed A/P: reasonably stable- continue current medications   Recommended follow up: Return in about 14 weeks (around 01/16/2023) for followup or sooner if needed.Schedule b4 you leave. Future Appointments  Date Time Provider [place]  10/11/2022  8:30 AM Edythe Clarity, [place] None  03/02/2023  8:20 AM Buford Dresser, MD [facility]  06/08/2023  9:30 AM [facility] 1 [facility]  09/01/2023 11:45 AM [facility] HEALTH COACH [facility]    Lab/Order associations:   ICD-10-CM   1. Chronic combined systolic and diastolic heart failure (HCC)  I50.42     2. Poorly controlled diabetes mellitus ([place])  E11.65 Comprehensive  metabolic panel    Hemoglobin A1c    3. Drug-induced myopathy  G72.0       Meds ordered this encounter  Medications   dapagliflozin propanediol (FARXIGA) 10 MG TABS tablet    Sig: Take 1 tablet (10 mg total) by mouth daily.    Dispense:  30 tablet    Refill:  5    Return precautions advised.  Garret Reddish, MD

## 2022-10-11 ENCOUNTER — Encounter: Payer: Medicare Other | Admitting: Pharmacist

## 2022-10-13 DIAGNOSIS — K080 Exfoliation of teeth due to systemic causes: Secondary | ICD-10-CM | POA: Diagnosis not present

## 2022-10-18 ENCOUNTER — Telehealth (HOSPITAL_BASED_OUTPATIENT_CLINIC_OR_DEPARTMENT_OTHER): Payer: Self-pay | Admitting: Cardiology

## 2022-10-18 ENCOUNTER — Other Ambulatory Visit: Payer: Self-pay | Admitting: Cardiology

## 2022-10-18 MED ORDER — REPATHA SURECLICK 140 MG/ML ~~LOC~~ SOAJ: SUBCUTANEOUS | 3 refills | Status: DC

## 2022-10-18 NOTE — Telephone Encounter (Signed)
Rx request sent to pharmacy.  

## 2022-10-18 NOTE — Telephone Encounter (Signed)
*  STAT* If patient is at the pharmacy, call can be transferred to refill team.   1. Which medications need to be refilled? (please list name of each medication and dose if known)   Evolocumab (REPATHA SURECLICK) 225 MG/ML SOAJ   2. Which pharmacy/location (including street and city if local pharmacy) is medication to be sent to?  [place], [place] - [address] AT [address]   3. Do they need a 30 day or 90 day supply?   90 day  Wife stated the patient will be out of this medication shortly.

## 2022-10-27 DIAGNOSIS — K080 Exfoliation of teeth due to systemic causes: Secondary | ICD-10-CM | POA: Diagnosis not present

## 2022-11-02 DIAGNOSIS — L738 Other specified follicular disorders: Secondary | ICD-10-CM | POA: Diagnosis not present

## 2022-11-17 DIAGNOSIS — Z800 Family history of malignant neoplasm of digestive organs: Secondary | ICD-10-CM | POA: Diagnosis not present

## 2022-11-17 DIAGNOSIS — E1151 Type 2 diabetes mellitus with diabetic peripheral angiopathy without gangrene: Secondary | ICD-10-CM | POA: Diagnosis not present

## 2022-11-17 DIAGNOSIS — Z7982 Long term (current) use of aspirin: Secondary | ICD-10-CM | POA: Diagnosis not present

## 2022-11-17 DIAGNOSIS — G4733 Obstructive sleep apnea (adult) (pediatric): Secondary | ICD-10-CM | POA: Diagnosis not present

## 2022-11-17 DIAGNOSIS — K317 Polyp of stomach and duodenum: Secondary | ICD-10-CM | POA: Diagnosis not present

## 2022-11-17 DIAGNOSIS — I110 Hypertensive heart disease with heart failure: Secondary | ICD-10-CM | POA: Diagnosis not present

## 2022-11-17 DIAGNOSIS — I255 Ischemic cardiomyopathy: Secondary | ICD-10-CM | POA: Diagnosis not present

## 2022-11-17 DIAGNOSIS — D132 Benign neoplasm of duodenum: Secondary | ICD-10-CM | POA: Diagnosis not present

## 2022-11-17 DIAGNOSIS — I272 Pulmonary hypertension, unspecified: Secondary | ICD-10-CM | POA: Diagnosis not present

## 2022-11-17 DIAGNOSIS — Z794 Long term (current) use of insulin: Secondary | ICD-10-CM | POA: Diagnosis not present

## 2022-11-17 DIAGNOSIS — I340 Nonrheumatic mitral (valve) insufficiency: Secondary | ICD-10-CM | POA: Diagnosis not present

## 2022-11-17 DIAGNOSIS — Z9889 Other specified postprocedural states: Secondary | ICD-10-CM | POA: Diagnosis not present

## 2022-11-17 DIAGNOSIS — D135 Benign neoplasm of extrahepatic bile ducts: Secondary | ICD-10-CM | POA: Diagnosis not present

## 2022-11-17 DIAGNOSIS — I509 Heart failure, unspecified: Secondary | ICD-10-CM | POA: Diagnosis not present

## 2022-11-17 DIAGNOSIS — Z955 Presence of coronary angioplasty implant and graft: Secondary | ICD-10-CM | POA: Diagnosis not present

## 2022-11-17 DIAGNOSIS — Z86010 Personal history of colonic polyps: Secondary | ICD-10-CM | POA: Diagnosis not present

## 2022-11-18 ENCOUNTER — Other Ambulatory Visit: Payer: Self-pay | Admitting: Family Medicine

## 2022-11-24 ENCOUNTER — Other Ambulatory Visit: Payer: Self-pay | Admitting: Family Medicine

## 2022-12-05 DIAGNOSIS — Z79899 Other long term (current) drug therapy: Secondary | ICD-10-CM | POA: Diagnosis not present

## 2022-12-05 DIAGNOSIS — H353131 Nonexudative age-related macular degeneration, bilateral, early dry stage: Secondary | ICD-10-CM | POA: Diagnosis not present

## 2022-12-05 DIAGNOSIS — H25813 Combined forms of age-related cataract, bilateral: Secondary | ICD-10-CM | POA: Diagnosis not present

## 2022-12-05 DIAGNOSIS — M069 Rheumatoid arthritis, unspecified: Secondary | ICD-10-CM | POA: Diagnosis not present

## 2022-12-07 ENCOUNTER — Telehealth: Payer: Self-pay | Admitting: Family Medicine

## 2022-12-07 MED ORDER — DAPAGLIFLOZIN PROPANEDIOL 10 MG PO TABS: 10.0000 mg | ORAL_TABLET | Freq: Every day | ORAL | 3 refills | Status: DC

## 2022-12-07 NOTE — Telephone Encounter (Signed)
Pt & spouse are traveling out of the country on 12/23/22. Pt has medications that are taken with needles. They are asking what is required for them to clear security (letter or rx?).  Also needing a sleep aid for the trip.  Lastly, pt is requesting to have his [place] sent in for 90 days instead of 30. Stating 30 days is costing them $141.

## 2022-12-07 NOTE — Telephone Encounter (Signed)
Called and spoke with pt wife and informed her that we dont know what they will need in order to clear security regarding their Rx's, I instructed her to call the airport/airline to get further direction. Casey Lynch has been sent in.  See below regarding sleep aid.

## 2022-12-08 ENCOUNTER — Encounter: Payer: Self-pay | Admitting: Family Medicine

## 2022-12-08 ENCOUNTER — Telehealth (INDEPENDENT_AMBULATORY_CARE_PROVIDER_SITE_OTHER): Payer: Medicare Other | Admitting: Family Medicine

## 2022-12-08 VITALS — Temp 101.3°F

## 2022-12-08 DIAGNOSIS — U071 COVID-19: Secondary | ICD-10-CM | POA: Diagnosis not present

## 2022-12-08 MED ORDER — NIRMATRELVIR/RITONAVIR (PAXLOVID) TABLET (RENAL DOSING): 2.0000 | ORAL_TABLET | Freq: Two times a day (BID) | ORAL | 0 refills | Status: DC

## 2022-12-08 NOTE — Progress Notes (Signed)
Virtual Visit via Video Note  I connected with Casey Lynch on 12/08/22 at 3:17 PM by a video enabled telemedicine application and verified that I am speaking with the correct person using two identifiers.  Patient location: home with wife, consent obtained to discuss PHI.  My location: office - [place].    I discussed the limitations, risks, security and privacy concerns of performing an evaluation and management service by telephone and the availability of in person appointments. I also discussed with the patient that there may be a patient responsible charge related to this service. The patient expressed understanding and agreed to proceed, consent obtained  Chief complaint:  Chief Complaint  Patient presents with   Covid Positive    Started last night Sore throat, nasal congestion, 101 fever, Covid positive this morning, felt fine yesterday, pt has had novapyrid (?) and worked well in the past better than paxlovid     History of Present Illness: Casey Lynch is a 77 y.o. male  COVID-19 infection Initial symptoms started overnight - early this morning, night with sore throat, nasal congestion, body ache, temp 99.6. fever of 101.3 at 11am.   Positive home COVID testing this morning.  Denies chest pain, min dyspnea to bathroom only, but no dyspnea at rest, able to drink fluids.  Denies confusion/disorientation. Home treatment: none yet.   Problem list reviewed, noted history of COPD, CAD, CHF, seronegative RA .   EGFR 45 on 10/10/2022  COVID-19 infection in 2022 treated with molnupiravir. Last covid vaccine booster 05/2022.   Will be flying 4/19 for 16 day trip.     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   Ischemic cardiomyopathy 07/25/2019   Chronic  combined systolic and diastolic heart failure 0000000   Coronary artery disease due to lipid rich plaque 07/12/2018   Mixed hyperlipidemia 07/12/2018   Statin intolerance 07/12/2018   Pulmonary emphysema 06/14/2018   Pulmonary HTN 06/14/2018   Past Medical History:  Diagnosis Date   Carotid bruit    Diabetes mellitus without complication (HCC)    Eczema    Heart failure ([place])    History of colonic polyps    Hypercholesterolemia    Hyperlipidemia    Macular degeneration    Osteoarthritis    Paresthesias    Pseudogout    Subclavian steal syndrome    Past Surgical History:  Procedure Laterality Date   CARDIAC CATHETERIZATION     COLONOSCOPY WITH PROPOFOL N/A 10/24/2016   Procedure: COLONOSCOPY WITH PROPOFOL;  Surgeon: Garlan Fair, MD;  Location: [facility];  Service: Endoscopy;  Laterality: N/A;   HYDROCELE EXCISION / REPAIR     KNEE SURGERY     REFRACTIVE SURGERY     TONSILLECTOMY     Allergies  Allergen Reactions   Jardiance [Empagliflozin] Other (See Comments)    Pain and stiffness   Statins Other (See Comments)    Terrible joint pain   Tetanus Toxoids Other (See Comments)    Fever 104/105 & arm stiffiness   Prior to Admission medications   Medication Sig Start Date End Date Taking? Authorizing Provider  aspirin EC 81 MG tablet Take 81 mg by mouth daily. Swallow whole.   Yes [provider]  B Complex Vitamins (VITAMIN B COMPLEX) TABS Take by mouth.   Yes  [provider]  Cholecalciferol (VITAMIN D3 PO) Take 1 capsule by mouth daily.   Yes [provider]  colchicine 0.6 MG tablet Take 1 tablet (0.6 mg total) by mouth daily. 10/06/20  Yes Orma Flaming, MD  Continuous Blood Gluc Sensor (FREESTYLE LIBRE 3 SENSOR) MISC Quantity: 2 sensors/month NDC# [phone]. [id number] 12/23/21  Yes Marin Olp, MD  CONTOUR NEXT TEST test strip  10/28/19  Yes [provider]  dapagliflozin propanediol (FARXIGA) 10 MG TABS tablet Take 1  tablet (10 mg total) by mouth daily. 12/07/22  Yes Marin Olp, MD  ENTRESTO 97-103 MG TAKE 1 TABLET BY MOUTH TWICE DAILY 04/25/22  Yes Buford Dresser, MD  Evolocumab (REPATHA SURECLICK) [id number] MG/ML SOAJ ADMINISTER 1 ML(140 MG) UNDER THE SKIN EVERY 14 DAYS 10/18/22  Yes Buford Dresser, MD  furosemide (LASIX) 20 MG tablet Take 1 tablet (20 mg total) by mouth daily as needed for edema. 09/27/21  Yes Buford Dresser, MD  gabapentin (NEURONTIN) 100 MG capsule TAKE 2 TO 3 CAPSULES(200 TO 300 MG) BY MOUTH AT BEDTIME 11/24/22  Yes Marin Olp, MD  hydroxychloroquine (PLAQUENIL) 200 MG tablet Take 200 mg by mouth 2 (two) times daily.   Yes [provider]  insulin glargine, 2 Unit Dial, (TOUJEO MAX SOLOSTAR) 300 UNIT/ML Solostar Pen Inject 10-20 Units into the skin daily. Please provide pen needles 08/12/22  Yes Marin Olp, MD  Insulin Pen Needle (B-D ULTRAFINE III SHORT PEN) 31G X 8 MM MISC USE AS DIRECTED TO INJECT BASAGLAR EVERY DAY 04/26/22  Yes Marin Olp, MD  metoprolol succinate (TOPROL-XL) 50 MG 24 hr tablet TAKE 1 TABLET(50 MG) BY MOUTH DAILY 10/18/22  Yes Buford Dresser, MD  triamcinolone (KENALOG) 0.1 % 1 application to affected area   Yes [provider]  nitroGLYCERIN (NITROSTAT) 0.4 MG SL tablet Place 1 tablet (0.4 mg total) under the tongue every 5 (five) minutes as needed for chest pain. 09/27/21 12/26/21  Buford Dresser, MD   Social History   Socioeconomic History   Marital status: Married    Spouse name: Not on file   Number of children: Not on file   Years of education: Not on file   Highest education level: Not on file  Occupational History   Not on file  Tobacco Use   Smoking status: Former    Packs/day: 1.00    Years: 59.00    Additional pack years: 0.00    Total pack years: 59.00    Types: Cigarettes    Quit date: 09/05/2016    Years since quitting: 6.2   Smokeless tobacco: Never  Vaping Use   Vaping  Use: Never used  Substance and Sexual Activity   Alcohol use: Not Currently    Comment: Rare   Drug use: No   Sexual activity: Yes  Other Topics Concern   Not on file  Social History Narrative   Married. 2 children. 3 grandkids.       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    - [employer]ational [employer]      Hobbies: golf, woodworking, fly tying for fly fishing   Social Determinants of Health   Financial Resource Strain: Low Risk  (08/25/2022)   Overall Financial Resource Strain (CARDIA)    Difficulty of Paying Living Expenses: Not hard at all  Food Insecurity: No Food Insecurity (08/25/2022)   Hunger Vital Sign    Worried About Charity fundraiser in  the Last Year: Never true    [place] in the Last Year: Never true  Transportation Needs: No Transportation Needs (08/25/2022)   PRAPARE - Hydrologist (Medical): No    Lack of Transportation (Non-Medical): No  Physical Activity: Sufficiently Active (08/25/2022)   Exercise Vital Sign    Days of Exercise per Week: 5 days    Minutes of Exercise per Session: 60 min  Stress: No Stress Concern Present (08/25/2022)   [place]    Feeling of Stress : Not at all  Social Connections: [place] (08/25/2022)   Social Connection and Isolation Panel [NHANES]    Frequency of Communication with Friends and Family: More than three times a week    Frequency of Social Gatherings with Friends and Family: More than three times a week    Attends Religious Services: More than 4 times per year    Active Member of [employer] or Organizations: Yes    Attends Archivist Meetings: 1 to 4 times per year    Marital Status: Married  Human resources officer Violence: Not At Risk (08/25/2022)   Humiliation, Afraid, Rape, and Kick questionnaire    Fear of Current or Ex-Partner: No    Emotionally Abused: No     Physically Abused: No    Sexually Abused: No    Observations/Objective: Vitals:   12/08/22 1417  Temp: (!) 101.3 F (38.5 C)  TempSrc: Temporal  Nontoxic appearance on video, speaking full sentences without respiratory distress.  Coherent responses, no audible wheeze or stridor.  All questions were answered with understanding of plan expressed.   Assessment and Plan: COVID-19 virus infection - Plan: nirmatrelvir/ritonavir, renal dosing, (PAXLOVID) 10 x 150 MG & 10 x 100MG  TABS Early symptoms, day 0.  Positive home testing.  No concerning symptoms at this time to necessitate hospital eval.  ER precautions were given.  Based on his health history discussed antiviral Paxlovid including potential risks, side effects, and risk of rebound COVID as well as treatment if that were to occur.  Elected to start [place].  Will need to stop colchicine for 1 week to not combine with Paxlovid.  Symptomatic care with Tylenol, Mucinex, fluids, rest and isolation/quarantine precautions discussed including updated CDC recommendations.  All questions were answered from patient and spouse.  Follow Up Instructions: As needed   I discussed the assessment and treatment plan with the patient. The patient was provided an opportunity to ask questions and all were answered. The patient agreed with the plan and demonstrated an understanding of the instructions.   The patient was advised to call back or seek an in-person evaluation if the symptoms worsen or if the condition fails to improve as anticipated.   Wendie Agreste, MD

## 2022-12-08 NOTE — Telephone Encounter (Signed)
Awaiting Dr. Yong Channel response from message yesterday.

## 2022-12-08 NOTE — Telephone Encounter (Signed)
Pt would like sleeping medication recommendations.

## 2022-12-08 NOTE — Telephone Encounter (Signed)
I would suggest a visit with me next week-I believe I have some availability on Monday and Tuesday to discuss further-I will out of the office yesterday afternoon when the message was sent to me

## 2022-12-08 NOTE — Patient Instructions (Addendum)
Sorry to hear that you are sick! Make sure to drink plenty of fluids, rest, Tylenol as needed for body aches or fever.  Mucinex if needed for cough.  Let me know if a stronger medication is needed.  I did send the antiviral Paxlovid to your pharmacy.  If you do start that medication, will need to stop colchicine for at least 1 week.  If any chest pain, shortness of breath at rest, confusion or acute worsening symptoms be seen but I do not expect that to occur.  Hope you feel better soon and please let us know if there are any questions.  Take care!

## 2022-12-12 ENCOUNTER — Ambulatory Visit (INDEPENDENT_AMBULATORY_CARE_PROVIDER_SITE_OTHER): Payer: Medicare Other | Admitting: Family Medicine

## 2022-12-12 ENCOUNTER — Encounter: Payer: Self-pay | Admitting: Family Medicine

## 2022-12-12 VITALS — BP 112/70 | HR 71 | Temp 97.6°F | Ht 66.0 in | Wt 162.4 lb

## 2022-12-12 DIAGNOSIS — E1165 Type 2 diabetes mellitus with hyperglycemia: Secondary | ICD-10-CM | POA: Diagnosis not present

## 2022-12-12 DIAGNOSIS — U071 COVID-19: Secondary | ICD-10-CM | POA: Diagnosis not present

## 2022-12-12 DIAGNOSIS — F5101 Primary insomnia: Secondary | ICD-10-CM | POA: Diagnosis not present

## 2022-12-12 MED ORDER — TRAZODONE HCL 50 MG PO TABS: 25.0000 mg | ORAL_TABLET | Freq: Every evening | ORAL | 3 refills | Status: DC | PRN

## 2022-12-12 NOTE — Patient Instructions (Addendum)
2 options:  Trial gabapentin 200 mg before bed without THC Trial trazodone 25-50 mg before bed without THC If neither works we could trial Ambien   Recommended follow up: Return for next already scheduled visit or sooner if needed.

## 2022-12-12 NOTE — Progress Notes (Signed)
Phone [phone] In person visit   Subjective:   Casey Lynch is a 77 y.o. year old very pleasant male patient who presents for/with See problem oriented charting Chief Complaint  Patient presents with   Covid Positive    Pt tested positive for covid on 04/04, he is fever and symptom free for 24 hours, he still has a runny nose and yesterday am temp was 99.2.   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 30 tablet 3   aspirin EC 81 MG tablet Take 81 mg by mouth daily. Swallow whole.     B Complex Vitamins (VITAMIN B COMPLEX) TABS Take by mouth.     Cholecalciferol (VITAMIN D3 PO) Take 1 capsule by mouth daily.     colchicine 0.6 MG tablet Take 1 tablet (0.6 mg total) by mouth daily. 90 tablet 0   Continuous Blood Gluc Sensor (FREESTYLE LIBRE 3 SENSOR) MISC Quantity: 2 sensors/month NDC# [phone]. [id number] 2 each 12   CONTOUR NEXT TEST test strip      dapagliflozin propanediol (FARXIGA) 10 MG TABS  tablet Take 1 tablet (10 mg total) by mouth daily. 90 tablet 3   ENTRESTO 97-103 MG TAKE 1 TABLET BY MOUTH TWICE DAILY 180 tablet 3   Evolocumab (REPATHA SURECLICK) 140 MG/ML SOAJ ADMINISTER 1 ML(140 MG) UNDER THE SKIN EVERY 14 DAYS 6 mL 3   furosemide (LASIX) 20 MG tablet Take 1 tablet (20 mg total) by mouth daily as needed for edema. 90 tablet 3   gabapentin (NEURONTIN) 100 MG capsule TAKE 2 TO 3 CAPSULES(200 TO 300 MG) BY MOUTH AT BEDTIME 90 capsule 5   hydroxychloroquine (PLAQUENIL) 200 MG tablet Take 200 mg by mouth 2 (two) times daily.     insulin glargine, 2 Unit Dial, (TOUJEO MAX SOLOSTAR) 300 UNIT/ML Solostar Pen Inject 10-20 Units into the skin daily. Please provide pen needles 15 mL 5   Insulin Pen Needle (B-D ULTRAFINE III SHORT PEN) 31G X 8 MM MISC USE AS DIRECTED TO INJECT BASAGLAR EVERY DAY 100 each 3   metoprolol succinate (TOPROL-XL) 50 MG 24 hr tablet TAKE 1 TABLET(50 MG) BY MOUTH DAILY 90 tablet 3   nitroGLYCERIN (NITROSTAT) 0.4 MG SL tablet Place 1 tablet (0.4 mg total) under the tongue every 5 (five) minutes as needed for chest pain. 90 tablet 3   triamcinolone (KENALOG) 0.1 % 1 application to affected area  No current facility-administered medications for this visit.     Objective:  BP 112/70   Pulse 71   Temp 97.6 F (36.4 C)   Ht 5\' 6"  (1.676 m)   Wt 162 lb 6.4 oz (73.7 kg)   SpO2 95%   BMI 26.21 kg/m  Gen: NAD, resting comfortably, well-appearing behind mask CV: RRR no murmurs rubs or gallops Lungs: CTAB no crackles, wheeze, rhonchi.  Ext: no edema Skin: warm, dry     Assessment and Plan   # Covid 19 S: Patient with symptoms starting 12/07/2022-diagnosed with COVID with treatment starting 12/08/2022. Ended up not taking the medication- picked up but not using.   Feels he is on the tail end thankfully with cough and congestion improving and no fever within 24 hours despite no fever reducing medicines A/P: COVID-19 resolving-continue conservative  management-we opted out of antiviral medication  # International travel/insomnia S:Leaving the country on 12/23/22 to [place], [place] , [place], [place] weveral days each. Normally takes THC but cannot take that on the trip so needs alternate aid for sleep. Sleeps 8-9 hours with THC only up 0-1 x a night.   Not concerned about sleeping on plane as much as sleeping while there. Never taken anything before other than takes gabapentin 100 mg at night.  A/P: Insomnia generally well-controlled at home with THC.  After discussion he prefers to avoid Ambien if possible  From AVS "  Patient Instructions  2 options:  Trial gabapentin 200 mg before bed without THC Trial trazodone 25-50 mg before bed without THC If neither works we could trial Ambien   Recommended follow up: Return for next already scheduled visit or sooner if needed. 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  We discussed her blood sugars may have been a result of COVID-19 and are improving as he improves-we will continue to monitor for now-if he has numbers over 180 we discussed he could use 24 units and then go back down to 22 units  Recommended follow up:  Return for next already scheduled visit or sooner if needed. Future Appointments  Date Time Provider Department Center  02/02/2023  1:00 PM Shelva Majestic, MD [facility]  03/02/2023  8:20 AM Jodelle Red, MD [facility]   06/08/2023  9:30 AM [facility]  09/01/2023 11:45 AM [facility] ANNUAL WELLNESS VISIT 1 [facility]   Lab/Order associations:   ICD-10-CM   1. COVID-19 virus infection  U07.1     2. Poorly controlled diabetes mellitus  E11.65     3. Primary insomnia  F51.01       Meds ordered this encounter  Medications   traZODone (DESYREL) 50 MG tablet    Sig: Take 0.5-1 tablets (25-50 mg total) by mouth at bedtime as needed for sleep.    Dispense:  30 tablet    Refill:  3    Return precautions advised.  Tana Conch, MD

## 2022-12-19 DIAGNOSIS — K080 Exfoliation of teeth due to systemic causes: Secondary | ICD-10-CM | POA: Diagnosis not present

## 2023-01-23 ENCOUNTER — Encounter: Payer: Self-pay | Admitting: Family Medicine

## 2023-01-24 ENCOUNTER — Encounter: Payer: Self-pay | Admitting: Physician Assistant

## 2023-01-24 ENCOUNTER — Ambulatory Visit (INDEPENDENT_AMBULATORY_CARE_PROVIDER_SITE_OTHER): Payer: Medicare Other | Admitting: Physician Assistant

## 2023-01-24 VITALS — BP 114/68 | HR 70 | Temp 98.0°F | Ht 66.0 in | Wt 171.0 lb

## 2023-01-24 DIAGNOSIS — J029 Acute pharyngitis, unspecified: Secondary | ICD-10-CM

## 2023-01-24 LAB — POCT RAPID STREP A (OFFICE): Rapid Strep A Screen: POSITIVE — AB

## 2023-01-24 LAB — POC COVID19 BINAXNOW: SARS Coronavirus 2 Ag: NEGATIVE

## 2023-01-24 MED ORDER — AMOXICILLIN 500 MG PO CAPS: 500.0000 mg | ORAL_CAPSULE | Freq: Two times a day (BID) | ORAL | 0 refills | Status: AC

## 2023-01-24 NOTE — Patient Instructions (Addendum)
It was great to see you!  Start amoxicillin for your strep throat  Be sure to change your toothbrush after completing antibiotic!  We will be in touch with COVID results, Pt notified COVID results were negative.  Reach out with any concerns  Take care,  Jarold Motto PA-C

## 2023-01-24 NOTE — Progress Notes (Signed)
Casey Lynch is a 77 y.o. male here for a new problem.  History of Present Illness:   Chief Complaint  Patient presents with   Sore Throat    Pt c/o sore throat, nasal congestion, post nasal drip, started 3 days ago.    Sore Throat  This is a new problem. The current episode started in the past 7 days. The problem has been gradually worsening. Neither side of throat is experiencing more pain than the other. There has been no fever. The patient is experiencing no pain. Associated symptoms include congestion and swollen glands. Pertinent negatives include no abdominal pain, ear pain or trouble swallowing. He has tried cool liquids for the symptoms. The treatment provided no relief.    Past Medical History:  Diagnosis Date   Carotid bruit    Diabetes mellitus without complication (HCC)    Eczema    Heart failure (HCC)    History of colonic polyps    Hypercholesterolemia    Hyperlipidemia    Macular degeneration    Osteoarthritis    Paresthesias    Pseudogout    Subclavian steal syndrome      Social History   Tobacco Use   Smoking status: Former    Packs/day: 1.00    Years: 59.00    Additional pack years: 0.00    Total pack years: 59.00    Types: Cigarettes    Quit date: 09/05/2016    Years since quitting: 6.3   Smokeless tobacco: Never  Vaping Use   Vaping Use: Never used  Substance Use Topics   Alcohol use: Not Currently    Comment: Rare   Drug use: No    Past Surgical History:  Procedure Laterality Date   CARDIAC CATHETERIZATION     COLONOSCOPY WITH PROPOFOL N/A 10/24/2016   Procedure: COLONOSCOPY WITH PROPOFOL;  Surgeon: Charolett Bumpers, MD;  Location: [facility];  Service: Endoscopy;  Laterality: N/A;   HYDROCELE EXCISION / REPAIR     KNEE SURGERY     REFRACTIVE SURGERY     TONSILLECTOMY      Family History  Problem Relation Age of Onset   Heart failure Father    Bipolar disorder Brother    Diabetes Brother    Cancer Paternal Grandmother      Allergies  Allergen Reactions   Jardiance [Empagliflozin] Other (See Comments)    Pain and stiffness   Statins Other (See Comments)    Terrible joint pain   Tetanus Toxoids Other (See Comments)    Fever 104/105 & arm stiffiness    Current Medications:   Current Outpatient Medications:    aspirin EC 81 MG tablet, Take 81 mg by mouth daily. Swallow whole., Disp: , Rfl:    B Complex Vitamins (VITAMIN B COMPLEX) TABS, Take by mouth., Disp: , Rfl:    Cholecalciferol (VITAMIN D3 PO), Take 1 capsule by mouth daily., Disp: , Rfl:    colchicine 0.6 MG tablet, Take 1 tablet (0.6 mg total) by mouth daily., Disp: 90 tablet, Rfl: 0   Continuous Blood Gluc Sensor (FREESTYLE LIBRE 3 SENSOR) MISC, Quantity: 2 sensors/month NDC# [phone]. [id number], Disp: 2 each, Rfl: 12   CONTOUR NEXT TEST test strip, , Disp: , Rfl:    dapagliflozin propanediol (FARXIGA) 10 MG TABS tablet, Take 1 tablet (10 mg total) by mouth daily., Disp: 90 tablet, Rfl: 3   ENTRESTO 97-103 MG, TAKE 1 TABLET BY MOUTH TWICE DAILY, Disp: 180 tablet, Rfl: 3   Evolocumab (REPATHA SURECLICK) 140  MG/ML SOAJ, ADMINISTER 1 ML(140 MG) UNDER THE SKIN EVERY 14 DAYS, Disp: 6 mL, Rfl: 3   furosemide (LASIX) 20 MG tablet, Take 1 tablet (20 mg total) by mouth daily as needed for edema., Disp: 90 tablet, Rfl: 3   gabapentin (NEURONTIN) 100 MG capsule, TAKE 2 TO 3 CAPSULES(200 TO 300 MG) BY MOUTH AT BEDTIME, Disp: 90 capsule, Rfl: 5   hydroxychloroquine (PLAQUENIL) 200 MG tablet, Take 200 mg by mouth 2 (two) times daily., Disp: , Rfl:    insulin glargine, 2 Unit Dial, (TOUJEO MAX SOLOSTAR) 300 UNIT/ML Solostar Pen, Inject 10-20 Units into the skin daily. Please provide pen needles (Patient taking differently: Inject 22 Units into the skin daily. Please provide pen needles), Disp: 15 mL, Rfl: 5   Insulin Pen Needle (B-D ULTRAFINE III SHORT PEN) 31G X 8 MM MISC, USE AS DIRECTED TO INJECT BASAGLAR EVERY DAY, Disp: 100 each, Rfl: 3   metoprolol  succinate (TOPROL-XL) 50 MG 24 hr tablet, TAKE 1 TABLET(50 MG) BY MOUTH DAILY, Disp: 90 tablet, Rfl: 3   traZODone (DESYREL) 50 MG tablet, Take 0.5-1 tablets (25-50 mg total) by mouth at bedtime as needed for sleep., Disp: 30 tablet, Rfl: 3   triamcinolone (KENALOG) 0.1 %, 1 application to affected area, Disp: , Rfl:    nitroGLYCERIN (NITROSTAT) 0.4 MG SL tablet, Place 1 tablet (0.4 mg total) under the tongue every 5 (five) minutes as needed for chest pain., Disp: 90 tablet, Rfl: 3   Review of Systems:   Review of Systems  HENT:  Positive for congestion. Negative for ear pain and trouble swallowing.   Gastrointestinal:  Negative for abdominal pain.    Vitals:   Vitals:   01/24/23 1019  BP: 114/68  Pulse: 70  Temp: 98 F (36.7 C)  TempSrc: Temporal  SpO2: 96%  Weight: 171 lb (77.6 kg)  Height: 5\' 6"  (1.676 m)     Body mass index is 27.6 kg/m.  Physical Exam:   Physical Exam Vitals and nursing note reviewed.  Constitutional:      General: He is not in acute distress.    Appearance: He is well-developed. He is not ill-appearing or toxic-appearing.  HENT:     Head: Normocephalic and atraumatic.     Right Ear: Tympanic membrane, ear canal and external ear normal. Tympanic membrane is not erythematous, retracted or bulging.     Left Ear: Tympanic membrane, ear canal and external ear normal. Tympanic membrane is not erythematous, retracted or bulging.     Nose: Nose normal.     Right Sinus: No maxillary sinus tenderness or frontal sinus tenderness.     Left Sinus: No maxillary sinus tenderness or frontal sinus tenderness.     Mouth/Throat:     Pharynx: Uvula midline. Posterior oropharyngeal erythema present.  Eyes:     General: Lids are normal.     Conjunctiva/sclera: Conjunctivae normal.  Neck:     Trachea: Trachea normal.  Cardiovascular:     Rate and Rhythm: Normal rate and regular rhythm.     Pulses: Normal pulses.     Heart sounds: Normal heart sounds, S1 normal and  S2 normal.  Pulmonary:     Effort: Pulmonary effort is normal.     Breath sounds: Normal breath sounds. No decreased breath sounds, wheezing, rhonchi or rales.  Lymphadenopathy:     Cervical: No cervical adenopathy.  Skin:    General: Skin is warm and dry.  Neurological:     Mental Status: He is  alert.     GCS: GCS eye subscore is 4. GCS verbal subscore is 5. GCS motor subscore is 6.  Psychiatric:        Speech: Speech normal.        Behavior: Behavior normal. Behavior is cooperative.    Results for orders placed or performed in visit on 01/24/23  POCT rapid strep A  Result Value Ref Range   Rapid Strep A Screen Positive (A) Negative  POC COVID-19  Result Value Ref Range   SARS Coronavirus 2 Ag Negative Negative    Assessment and Plan:   Sore throat No red flags on exam.  Will initiate amoxicillin 500 mg twice daily x 10 days for strep per orders. Discussed taking medications as prescribed. Reviewed return precautions including worsening fever, SOB, worsening cough or other concerns. Push fluids and rest. I recommend that patient follow-up if symptoms worsen or persist despite treatment x 7-10 days, sooner if needed.     Jarold Motto, PA-C

## 2023-02-02 ENCOUNTER — Ambulatory Visit (INDEPENDENT_AMBULATORY_CARE_PROVIDER_SITE_OTHER): Payer: Medicare Other | Admitting: Family Medicine

## 2023-02-02 ENCOUNTER — Encounter: Payer: Self-pay | Admitting: Family Medicine

## 2023-02-02 VITALS — BP 130/62 | HR 72 | Temp 97.3°F | Ht 66.0 in | Wt 170.0 lb

## 2023-02-02 DIAGNOSIS — Z794 Long term (current) use of insulin: Secondary | ICD-10-CM

## 2023-02-02 DIAGNOSIS — Z7984 Long term (current) use of oral hypoglycemic drugs: Secondary | ICD-10-CM

## 2023-02-02 DIAGNOSIS — J439 Emphysema, unspecified: Secondary | ICD-10-CM

## 2023-02-02 DIAGNOSIS — G720 Drug-induced myopathy: Secondary | ICD-10-CM

## 2023-02-02 DIAGNOSIS — I5042 Chronic combined systolic (congestive) and diastolic (congestive) heart failure: Secondary | ICD-10-CM | POA: Diagnosis not present

## 2023-02-02 DIAGNOSIS — I2583 Coronary atherosclerosis due to lipid rich plaque: Secondary | ICD-10-CM | POA: Diagnosis not present

## 2023-02-02 DIAGNOSIS — I2510 Atherosclerotic heart disease of native coronary artery without angina pectoris: Secondary | ICD-10-CM | POA: Diagnosis not present

## 2023-02-02 DIAGNOSIS — G458 Other transient cerebral ischemic attacks and related syndromes: Secondary | ICD-10-CM

## 2023-02-02 DIAGNOSIS — E1165 Type 2 diabetes mellitus with hyperglycemia: Secondary | ICD-10-CM

## 2023-02-02 DIAGNOSIS — M0600 Rheumatoid arthritis without rheumatoid factor, unspecified site: Secondary | ICD-10-CM

## 2023-02-02 LAB — CBC WITH DIFFERENTIAL/PLATELET
Basophils Absolute: 0.1 10*3/uL (ref 0.0–0.1)
Basophils Relative: 1.1 % (ref 0.0–3.0)
Eosinophils Absolute: 0.6 10*3/uL (ref 0.0–0.7)
Eosinophils Relative: 5.8 % — ABNORMAL HIGH (ref 0.0–5.0)
HCT: 42.7 % (ref 39.0–52.0)
Hemoglobin: 14.1 g/dL (ref 13.0–17.0)
Lymphocytes Relative: 24.7 % (ref 12.0–46.0)
Lymphs Abs: 2.4 10*3/uL (ref 0.7–4.0)
MCHC: 33 g/dL (ref 30.0–36.0)
MCV: 89.6 fl (ref 78.0–100.0)
Monocytes Absolute: 0.9 10*3/uL (ref 0.1–1.0)
Monocytes Relative: 8.8 % (ref 3.0–12.0)
Neutro Abs: 5.9 10*3/uL (ref 1.4–7.7)
Neutrophils Relative %: 59.6 % (ref 43.0–77.0)
Platelets: 350 10*3/uL (ref 150.0–400.0)
RBC: 4.76 Mil/uL (ref 4.22–5.81)
RDW: 14.3 % (ref 11.5–15.5)
WBC: 9.9 10*3/uL (ref 4.0–10.5)

## 2023-02-02 LAB — COMPREHENSIVE METABOLIC PANEL
ALT: 24 U/L (ref 0–53)
AST: 21 U/L (ref 0–37)
Albumin: 4.3 g/dL (ref 3.5–5.2)
Alkaline Phosphatase: 108 U/L (ref 39–117)
BUN: 28 mg/dL — ABNORMAL HIGH (ref 6–23)
CO2: 29 mEq/L (ref 19–32)
Calcium: 10.1 mg/dL (ref 8.4–10.5)
Chloride: 100 mEq/L (ref 96–112)
Creatinine, Ser: 1.48 mg/dL (ref 0.40–1.50)
GFR: 45.26 mL/min — ABNORMAL LOW (ref >60.00)
Glucose, Bld: 84 mg/dL (ref 70–99)
Potassium: 4.8 mEq/L (ref 3.5–5.1)
Sodium: 138 mEq/L (ref 135–145)
Total Bilirubin: 0.4 mg/dL (ref 0.2–1.2)
Total Protein: 7.3 g/dL (ref 6.0–8.3)

## 2023-02-02 LAB — HEMOGLOBIN A1C: Hgb A1c MFr Bld: 8.6 % — ABNORMAL HIGH (ref 4.6–6.5)

## 2023-02-02 NOTE — Patient Instructions (Addendum)
Let us know if you get any more COVID vaccines.  I am concerned that sugars may not be much better based on a1c with recent stressors- and sugars still running 140-150 in the morning despite farxiga increase- check a1c but strongly considering going to 24 units as long as no low blood sugars  Please stop by lab before you go If you have mychart- we will send your results within 3 business days of [place] receiving them.  If you do not have mychart- we will call you about results within 5 business days of [place] receiving them.  *please also note that you will see labs on mychart as soon as they post. I will later go in and write notes on them- will say "notes from Dr. Durene Cal"    Recommended follow up: Return in about 14 weeks (around 05/11/2023) for followup or sooner if needed.Schedule b4 you leave.  Then another 14 weeks later schedule your physical -try to come fasting next visit so we can check cholesterol and consider vascepa

## 2023-02-02 NOTE — Progress Notes (Signed)
Phone [phone] In person visit   Subjective:   Casey Lynch is a 77 y.o. 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    Priority: Medium    Seronegative rheumatoid arthritis (HCC) 10/14/2021    Priority: Medium    Pseudogout involving multiple joints 10/01/2020    Priority: Medium    Mixed hyperlipidemia 07/12/2018    Priority: Medium    Statin intolerance 07/12/2018    Priority: Medium    Pulmonary HTN (HCC) 06/14/2018    Priority: Medium    COVID-19 07/13/2021    Priority: Low   Personal history of COVID-19 10/25/2020    Priority: Low   Pulmonary emphysema (HCC) 06/14/2018    Priority: Low    Medications- reviewed and updated Current Outpatient Medications  Medication Sig Dispense Refill   aspirin EC 81 MG tablet Take 81 mg by mouth daily. Swallow whole.     B Complex Vitamins (VITAMIN B COMPLEX) TABS Take by mouth.     Cholecalciferol (VITAMIN D3 PO) Take 1 capsule by mouth daily.     colchicine 0.6 MG tablet Take 1 tablet (0.6 mg total) by mouth daily. 90 tablet 0   CONTOUR NEXT TEST test strip      dapagliflozin propanediol (FARXIGA) 10 MG TABS tablet Take 1 tablet (10 mg total) by mouth daily. 90 tablet 3   ENTRESTO 97-103 MG TAKE 1 TABLET BY MOUTH TWICE DAILY 180 tablet 3   Evolocumab (REPATHA SURECLICK) 140 MG/ML SOAJ ADMINISTER 1 ML(140 MG) UNDER THE SKIN EVERY 14 DAYS 6  mL 3   furosemide (LASIX) 20 MG tablet Take 1 tablet (20 mg total) by mouth daily as needed for edema. 90 tablet 3   gabapentin (NEURONTIN) 100 MG capsule TAKE 2 TO 3 CAPSULES(200 TO 300 MG) BY MOUTH AT BEDTIME 90 capsule 5   hydroxychloroquine (PLAQUENIL) 200 MG tablet Take 200 mg by mouth 2 (two) times daily.     insulin glargine, 2 Unit Dial, (TOUJEO MAX SOLOSTAR) 300 UNIT/ML Solostar Pen Inject 10-20 Units into the skin daily. Please provide pen needles (Patient taking differently: Inject 22 Units into the skin daily. Please provide pen needles) 15 mL 5   Insulin Pen Needle (B-D ULTRAFINE III SHORT PEN) 31G X 8 MM MISC USE AS DIRECTED TO INJECT BASAGLAR EVERY DAY 100 each 3   metoprolol succinate (TOPROL-XL) 50 MG 24 hr tablet TAKE 1 TABLET(50 MG) BY MOUTH DAILY 90 tablet 3   traZODone (DESYREL) 50 MG tablet Take 0.5-1 tablets (25-50 mg total) by mouth at bedtime as needed for sleep. 30 tablet 3   triamcinolone (KENALOG) 0.1 % 1 application to affected area     amoxicillin (AMOXIL) 500 MG capsule Take 1 capsule (500 mg total) by mouth in the morning and at bedtime for 10 days. (Patient not taking: Reported on 02/02/2023) 20 capsule 0  nitroGLYCERIN (NITROSTAT) 0.4 MG SL tablet Place 1 tablet (0.4 mg total) under the tongue every 5 (five) minutes as needed for chest pain. 90 tablet 3   No current facility-administered medications for this visit.     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 No low blood sugars Exercise and diet- moving tomorrow so exercise has been lower -wonders if sugar could be higher after [place] vacation and stress with move Lab Results  Component Value Date   HGBA1C 7.7 (H) 10/10/2022   HGBA1C 7.3 (H) 07/04/2022   HGBA1C 8.1 (H) 01/17/2022  A/P: I am concerned that sugars may not be much better based on a1c with recent stressors- and sugars still running 140-150 in the morning despite farxiga increase- check a1c but strongly considering going to 24 units as long as no low blood sugars  #CAD-follows with Dr. Sharol Harness RCA but diffuse mild disease #Chronic systolic and diastolic heart failure related to ischemic cardiomyopathy in addition to nonischemic cardiomyopathy #PAD-carotid bruit and left subclavian steal syndrome #hyperlipidemia with statin intolerance #Aortic atherosclerosis-incidental finding lung cancer screening  S: Medication:Aspirin 81 mg -For lipids on Repatha -For heart failure on Entresto 97-103 mg, metoprolol extended release 50 mg, Lasix 20 mg daily as needed  -Did not tolerate Jardiance due to excessive urination but doing ok on farxiga  Lab Results  Component Value Date   CHOL 123 07/04/2022   HDL 39.70 07/04/2022   LDLCALC 31 12/24/2019   LDLDIRECT 47.0 07/04/2022   TRIG (H) 07/04/2022    460.0 Triglyceride is over 400; calculations on Lipids are invalid.   CHOLHDL 3 07/04/2022   A/P: CAD- asymptomatic other than mild shortness of breath with ills - continue current medications CHF- no increased edema or weight- continue current medications Subclavian steal- asymptomatic - continue to monitor  Lipids- remain on Repatha- statin intolerance noted Aortic atherosclerosis (presumed stable)- LDL goal ideally <70 - at goal at 47- want improvement on triglycerides though- hoping improves as a1c improves- he is  open to considering vascepa if #s do not improve   #COPD/former smoker S:  medication: none A/P: asymptomatic finding- continue to monitor without meds  -in lung cancer screening program though may graduate   #Seronegative RA per Dr. Nickola Major- has been told possible psoriatic arthritis S: on hydroxychloroquine 200mg  - also  possible psoriatic arthritis, on turmeric.  Follows with Dr. Nickola Major. A/P: reasonable control lately- gets moving within about 5 minutes - continue current medications    # psuedogout S:Colchicine through Dr. 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 Then 14 week physical Future Appointments  Date Time Provider Department Center  03/02/2023  8:20 AM Jodelle Red, MD [facility]  06/08/2023  9:30 AM [facility]  09/01/2023 11:45 AM [facility] ANNUAL WELLNESS VISIT 1 [facility]   Lab/Order associations: NOT fasting   ICD-10-CM   1. Chronic combined systolic and diastolic heart failure (HCC)  Z61.09     2. Coronary artery disease due to lipid rich plaque  I25.10    I25.83     3. Poorly controlled diabetes mellitus (HCC)  E11.65     4. Pulmonary emphysema, unspecified emphysema type (HCC)  J43.9     5. Seronegative rheumatoid arthritis (HCC) Chronic M06.00     6. Drug-induced myopathy  G72.0     7. Subclavian steal syndrome  G45.8       No orders of the defined types were placed in this encounter.   Return precautions advised.  Tana Conch, MD

## 2023-02-09 ENCOUNTER — Other Ambulatory Visit: Payer: Self-pay

## 2023-02-09 ENCOUNTER — Encounter: Payer: Self-pay | Admitting: Family Medicine

## 2023-02-23 DIAGNOSIS — M0609 Rheumatoid arthritis without rheumatoid factor, multiple sites: Secondary | ICD-10-CM | POA: Diagnosis not present

## 2023-02-23 DIAGNOSIS — Z79899 Other long term (current) drug therapy: Secondary | ICD-10-CM | POA: Diagnosis not present

## 2023-02-23 DIAGNOSIS — E790 Hyperuricemia without signs of inflammatory arthritis and tophaceous disease: Secondary | ICD-10-CM | POA: Diagnosis not present

## 2023-02-23 DIAGNOSIS — M1120 Other chondrocalcinosis, unspecified site: Secondary | ICD-10-CM | POA: Diagnosis not present

## 2023-02-23 DIAGNOSIS — M1991 Primary osteoarthritis, unspecified site: Secondary | ICD-10-CM | POA: Diagnosis not present

## 2023-03-01 ENCOUNTER — Other Ambulatory Visit: Payer: Self-pay

## 2023-03-01 MED ORDER — BD PEN NEEDLE SHORT U/F 31G X 8 MM MISC: 3 refills | Status: DC

## 2023-03-02 ENCOUNTER — Ambulatory Visit (HOSPITAL_BASED_OUTPATIENT_CLINIC_OR_DEPARTMENT_OTHER): Payer: Medicare Other | Admitting: Cardiology

## 2023-03-02 ENCOUNTER — Encounter (HOSPITAL_BASED_OUTPATIENT_CLINIC_OR_DEPARTMENT_OTHER): Payer: Self-pay | Admitting: Cardiology

## 2023-03-02 VITALS — BP 130/62 | HR 61 | Ht 66.0 in | Wt 171.0 lb

## 2023-03-02 DIAGNOSIS — I5042 Chronic combined systolic (congestive) and diastolic (congestive) heart failure: Secondary | ICD-10-CM | POA: Diagnosis not present

## 2023-03-02 DIAGNOSIS — Z794 Long term (current) use of insulin: Secondary | ICD-10-CM

## 2023-03-02 DIAGNOSIS — I25118 Atherosclerotic heart disease of native coronary artery with other forms of angina pectoris: Secondary | ICD-10-CM

## 2023-03-02 DIAGNOSIS — I255 Ischemic cardiomyopathy: Secondary | ICD-10-CM | POA: Diagnosis not present

## 2023-03-02 DIAGNOSIS — E782 Mixed hyperlipidemia: Secondary | ICD-10-CM

## 2023-03-02 DIAGNOSIS — E1159 Type 2 diabetes mellitus with other circulatory complications: Secondary | ICD-10-CM

## 2023-03-02 DIAGNOSIS — Z789 Other specified health status: Secondary | ICD-10-CM

## 2023-03-02 DIAGNOSIS — K080 Exfoliation of teeth due to systemic causes: Secondary | ICD-10-CM | POA: Diagnosis not present

## 2023-03-02 NOTE — Progress Notes (Signed)
Cardiology Office Note:  .    Date:  03/02/2023  ID:  Casey Lynch, DOB [birth year], MRN [id number] PCP: Shelva Majestic, MD  [facility] Providers Cardiologist:  Jodelle Red, MD     History of Present Illness: .    Casey Lynch is a 77 y.o. male with a hx of with CAD, chronic systolic and diastolic heart failure, ischemic cardiomyopathy, hyperlipidemia, PAD (carotid bruit <50% bilaterally, left subclavian steal syndrome), COPD, former tobacco abuse, OSA on CPAP, type II diabetes, here for follow up. I met him 07/11/2018 and his initial cardiac evaluation from [place] is summarized in that note.   Today, he is accompanied by his wife. He appears well today; recently enjoyed a trip to [place] and has moved to a new home closer to his son.   Occasionally when climbing uphill while on the golf course he may have to stop and catch his breath. He is able to continue after a brief rest. No anginal symptoms or syncope. Affirms being able to lift 25-30 lb boxes without issues.  Additionally he complains of left hip pain that may occur depending on how he is sitting, and sometimes while lying down.  He denies any palpitations, chest pain, peripheral edema, lightheadedness, headaches, syncope, orthopnea, or PND.  ROS:  Please see the history of present illness. ROS otherwise negative except as noted.  (+) Exertional shortness of breath (+) Left hip pain  Studies Reviewed: Marland Kitchen    EKG Interpretation  Date/Time:  Thursday March 02 2023 08:22:15 EDT Ventricular Rate:  61 PR Interval:  150 QRS Duration: 148 QT Interval:  434 QTC Calculation: 436 R Axis:   17 Text Interpretation: Normal sinus rhythm Right bundle branch block When compared with ECG of 18-Nov-2005 08:11, No significant change was found Confirmed by Jodelle Red [phone]) on 03/02/2023 8:23:53 AM    Physical Exam:    VS:  BP 130/62   Pulse 61   Ht 5\' 6"  (1.676 m)   Wt 171 lb (77.6 kg)   BMI 27.60  kg/m    Wt Readings from Last 3 Encounters:  03/02/23 171 lb (77.6 kg)  02/02/23 170 lb (77.1 kg)  01/24/23 171 lb (77.6 kg)    GEN: Well nourished, well developed in no acute distress HEENT: Normal, moist mucous membranes NECK: No JVD CARDIAC: regular rhythm, normal S1 and S2, no rubs or gallops. No murmur. VASCULAR: Radial and DP pulses 2+ bilaterally. No carotid bruits RESPIRATORY:  Clear to auscultation without rales, wheezing or rhonchi  ABDOMEN: Soft, non-tender, non-distended MUSCULOSKELETAL:  Ambulates independently SKIN: Warm and dry, no edema NEUROLOGIC:  Alert and oriented x 3. No focal neuro deficits noted. PSYCHIATRIC:  Normal affect   ASSESSMENT AND PLAN: .    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:   -NYHA class II, EF improved to 50-55% on 08/2020 echo (was 40-45%) -tolerating maximum dose entresto, continue -tolerating metoprolol succinate 50 mg daily for stable angina -did not tolerate spironolactone per report -did not tolerate jardiance; now tolerating farxiga   CAD (predominantly RCA, but diffuse mild disease), with stable angina: -tolerating metoprolol succinate 50 mg daily for stable angina -on aspirin 81 mg -avoid long term NSAIDs given CAD -lipids as below -discussed red flag warning signs that need immediate medical attention, such as chest tightness that does not resolve with rest   Mixed hyperlipidemia, statin intolerance: -LDL goal <70 -doing well  on repatha, last LDL 45 -could not afford vascepa at >$500 -last TG 262 but unclear if fasting, but focusing on lifestyle/diabetes control. Continue to monitor   Type II diabetes, on insulin -has neuropathy and vascular disease as  diabetic complication -last A1c 8.6 -has had some weight gain on glargine -tolerating Farxiga   Cardiac risk counseling and prevention  recommendations: -recommend heart healthy/Mediterranean diet, with whole grains, fruits, vegetable, fish, lean meats, nuts, and olive oil. Limit salt. -recommend moderate walking, 3-5 times/week for 30-50 minutes each session. Aim for at least 150 minutes.week. Goal should be pace of 3 miles/hours, or walking 1.5 miles in 30 minutes -recommend avoidance of tobacco products. Avoid excess alcohol. Dispo: Follow-up in 6 months, or sooner as needed.  I,Mathew Stumpf,acting as a Neurosurgeon for [employer], MD.,have documented all relevant documentation on the behalf of Jodelle Red, MD,as directed by  Jodelle Red, MD while in the presence of Jodelle Red, MD.  I, Jodelle Red, MD, have reviewed all documentation for this visit. The documentation on 03/02/23 for the exam, diagnosis, procedures, and orders are all accurate and complete.   Signed, Jodelle Red, MD

## 2023-03-02 NOTE — Patient Instructions (Addendum)
Medication Instructions:  Your physician recommends that you continue on your current medications as directed. Please refer to the Current Medication list given to you today.  *If you need a refill on your cardiac medications before your next appointment, please call your pharmacy*  Lab Work: NONE  Testing/Procedures: NONE  Follow-Up: At [facility], you and your health needs are our priority.  As part of our continuing mission to provide you with exceptional heart care, we have created designated Provider Care Teams.  These Care Teams include your primary Cardiologist (physician) and Advanced Practice Providers (APPs -  Physician Assistants and Nurse Practitioners) who all work together to provide you with the care you need, when you need it.  We recommend signing up for the patient portal called "MyChart".  Sign up information is provided on this After Visit Summary.  MyChart is used to connect with patients for Virtual Visits (Telemedicine).  Patients are able to view lab/test results, encounter notes, upcoming appointments, etc.  Non-urgent messages can be sent to your provider as well.   To learn more about what you can do with MyChart, go to [email or web address].    Your next appointment:   6 month(s)  The format for your next appointment:   In Person  Provider:   Jodelle Red, MD OR Ronn Melena NP

## 2023-03-08 ENCOUNTER — Other Ambulatory Visit: Payer: Self-pay | Admitting: Family Medicine

## 2023-04-15 ENCOUNTER — Other Ambulatory Visit: Payer: Self-pay | Admitting: Cardiology

## 2023-04-17 DIAGNOSIS — K080 Exfoliation of teeth due to systemic causes: Secondary | ICD-10-CM | POA: Diagnosis not present

## 2023-04-20 ENCOUNTER — Encounter (INDEPENDENT_AMBULATORY_CARE_PROVIDER_SITE_OTHER): Payer: Self-pay

## 2023-04-20 DIAGNOSIS — K080 Exfoliation of teeth due to systemic causes: Secondary | ICD-10-CM | POA: Diagnosis not present

## 2023-04-23 ENCOUNTER — Encounter (HOSPITAL_BASED_OUTPATIENT_CLINIC_OR_DEPARTMENT_OTHER): Payer: Self-pay

## 2023-04-23 DIAGNOSIS — I5042 Chronic combined systolic (congestive) and diastolic (congestive) heart failure: Secondary | ICD-10-CM

## 2023-04-24 ENCOUNTER — Other Ambulatory Visit (HOSPITAL_BASED_OUTPATIENT_CLINIC_OR_DEPARTMENT_OTHER): Payer: Self-pay | Admitting: Cardiology

## 2023-04-24 DIAGNOSIS — I5042 Chronic combined systolic (congestive) and diastolic (congestive) heart failure: Secondary | ICD-10-CM

## 2023-04-24 MED ORDER — FUROSEMIDE 20 MG PO TABS
20.0000 mg | ORAL_TABLET | Freq: Every day | ORAL | 3 refills | Status: DC | PRN
Start: 2023-04-24 — End: 2024-01-10

## 2023-05-01 ENCOUNTER — Other Ambulatory Visit (HOSPITAL_BASED_OUTPATIENT_CLINIC_OR_DEPARTMENT_OTHER): Payer: Self-pay | Admitting: Cardiology

## 2023-05-04 ENCOUNTER — Ambulatory Visit: Payer: Medicare Other

## 2023-05-04 ENCOUNTER — Telehealth: Payer: Self-pay | Admitting: Family Medicine

## 2023-05-04 ENCOUNTER — Other Ambulatory Visit (INDEPENDENT_AMBULATORY_CARE_PROVIDER_SITE_OTHER): Payer: Medicare Other

## 2023-05-04 DIAGNOSIS — E1165 Type 2 diabetes mellitus with hyperglycemia: Secondary | ICD-10-CM

## 2023-05-04 DIAGNOSIS — E782 Mixed hyperlipidemia: Secondary | ICD-10-CM

## 2023-05-04 LAB — HEMOGLOBIN A1C: Hgb A1c MFr Bld: 8.6 % — ABNORMAL HIGH (ref 4.6–6.5)

## 2023-05-04 LAB — CBC WITH DIFFERENTIAL/PLATELET
Basophils Absolute: 0.1 10*3/uL (ref 0.0–0.1)
Basophils Relative: 1 % (ref 0.0–3.0)
Eosinophils Absolute: 0.3 10*3/uL (ref 0.0–0.7)
Eosinophils Relative: 3.7 % (ref 0.0–5.0)
HCT: 46 % (ref 39.0–52.0)
Hemoglobin: 15 g/dL (ref 13.0–17.0)
Lymphocytes Relative: 20.1 % (ref 12.0–46.0)
Lymphs Abs: 1.9 10*3/uL (ref 0.7–4.0)
MCHC: 32.5 g/dL (ref 30.0–36.0)
MCV: 90.8 fl (ref 78.0–100.0)
Monocytes Absolute: 0.7 10*3/uL (ref 0.1–1.0)
Monocytes Relative: 7.2 % (ref 3.0–12.0)
Neutro Abs: 6.4 10*3/uL (ref 1.4–7.7)
Neutrophils Relative %: 68 % (ref 43.0–77.0)
Platelets: 389 10*3/uL (ref 150.0–400.0)
RBC: 5.07 Mil/uL (ref 4.22–5.81)
RDW: 13.9 % (ref 11.5–15.5)
WBC: 9.4 10*3/uL (ref 4.0–10.5)

## 2023-05-04 LAB — COMPREHENSIVE METABOLIC PANEL
ALT: 26 U/L (ref 0–53)
AST: 19 U/L (ref 0–37)
Albumin: 4.3 g/dL (ref 3.5–5.2)
Alkaline Phosphatase: 108 U/L (ref 39–117)
BUN: 24 mg/dL — ABNORMAL HIGH (ref 6–23)
CO2: 29 mEq/L (ref 19–32)
Calcium: 10.5 mg/dL (ref 8.4–10.5)
Chloride: 100 mEq/L (ref 96–112)
Creatinine, Ser: 1.48 mg/dL (ref 0.40–1.50)
GFR: 45.18 mL/min — ABNORMAL LOW (ref >60.00)
Glucose, Bld: 164 mg/dL — ABNORMAL HIGH (ref 70–99)
Potassium: 4.6 mEq/L (ref 3.5–5.1)
Sodium: 137 mEq/L (ref 135–145)
Total Bilirubin: 0.4 mg/dL (ref 0.2–1.2)
Total Protein: 7.8 g/dL (ref 6.0–8.3)

## 2023-05-04 LAB — MICROALBUMIN / CREATININE URINE RATIO
Creatinine,U: 34 mg/dL
Microalb Creat Ratio: 2.5 mg/g (ref 0.0–30.0)
Microalb, Ur: 0.8 mg/dL (ref 0.0–1.9)

## 2023-05-04 LAB — LIPID PANEL
Cholesterol: 126 mg/dL (ref 0–200)
HDL: 42.5 mg/dL (ref >39.00)
NonHDL: 83.6
Total CHOL/HDL Ratio: 3
Triglycerides: 287 mg/dL — ABNORMAL HIGH (ref 0.0–149.0)
VLDL: 57.4 mg/dL — ABNORMAL HIGH (ref 0.0–40.0)

## 2023-05-04 LAB — LDL CHOLESTEROL, DIRECT: Direct LDL: 64 mg/dL

## 2023-05-04 NOTE — Telephone Encounter (Signed)
Desired previsit labs. Ordered today

## 2023-05-15 ENCOUNTER — Encounter: Payer: Self-pay | Admitting: Family Medicine

## 2023-05-15 ENCOUNTER — Ambulatory Visit (INDEPENDENT_AMBULATORY_CARE_PROVIDER_SITE_OTHER): Payer: Medicare Other | Admitting: Family Medicine

## 2023-05-15 VITALS — BP 122/70 | HR 71 | Temp 97.7°F | Ht 66.0 in | Wt 175.4 lb

## 2023-05-15 DIAGNOSIS — E1165 Type 2 diabetes mellitus with hyperglycemia: Secondary | ICD-10-CM | POA: Diagnosis not present

## 2023-05-15 DIAGNOSIS — I2510 Atherosclerotic heart disease of native coronary artery without angina pectoris: Secondary | ICD-10-CM

## 2023-05-15 DIAGNOSIS — Z23 Encounter for immunization: Secondary | ICD-10-CM | POA: Diagnosis not present

## 2023-05-15 DIAGNOSIS — I2583 Coronary atherosclerosis due to lipid rich plaque: Secondary | ICD-10-CM

## 2023-05-15 DIAGNOSIS — I5042 Chronic combined systolic (congestive) and diastolic (congestive) heart failure: Secondary | ICD-10-CM

## 2023-05-15 DIAGNOSIS — E782 Mixed hyperlipidemia: Secondary | ICD-10-CM | POA: Diagnosis not present

## 2023-05-15 DIAGNOSIS — G720 Drug-induced myopathy: Secondary | ICD-10-CM

## 2023-05-15 MED ORDER — METFORMIN HCL ER 500 MG PO TB24: 500.0000 mg | ORAL_TABLET | Freq: Every day | ORAL | 5 refills | Status: DC

## 2023-05-15 NOTE — Progress Notes (Signed)
Phone [phone] In person visit   Subjective:   Casey Lynch is a 77 y.o. 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    Priority: Medium    Seronegative rheumatoid arthritis (HCC) 10/14/2021    Priority: Medium    Pseudogout involving multiple joints 10/01/2020    Priority: Medium    Mixed hyperlipidemia 07/12/2018    Priority: Medium    Statin intolerance 07/12/2018    Priority: Medium    Pulmonary HTN (HCC) 06/14/2018    Priority: Medium    COVID-19 07/13/2021    Priority: Low   Personal history of COVID-19 10/25/2020    Priority: Low   Pulmonary emphysema (HCC) 06/14/2018    Priority: Low    Medications- reviewed and updated Current Outpatient Medications  Medication Sig Dispense Refill   aspirin EC 81 MG tablet Take 81 mg by mouth daily. Swallow whole.     B Complex Vitamins (VITAMIN B COMPLEX) TABS Take by mouth.     Cholecalciferol (VITAMIN D3 PO) Take 1 capsule by mouth daily.     colchicine 0.6 MG tablet Take 1 tablet (0.6 mg total) by mouth daily. 90 tablet 0   CONTOUR NEXT TEST test strip      dapagliflozin propanediol (FARXIGA) 10 MG TABS tablet TAKE 1 TABLET(10 MG) BY MOUTH DAILY 30 tablet 5   ENTRESTO 97-103 MG TAKE 1 TABLET BY MOUTH TWICE DAILY 180 tablet 3   furosemide (LASIX) 20 MG tablet Take 1 tablet (20 mg total) by mouth daily as needed for edema. 90 tablet 3   gabapentin (NEURONTIN) 100 MG capsule  TAKE 2 TO 3 CAPSULES(200 TO 300 MG) BY MOUTH AT BEDTIME 90 capsule 5   hydroxychloroquine (PLAQUENIL) 200 MG tablet Take 200 mg by mouth 2 (two) times daily.     Insulin Glargine (TOUJEO MAX SOLOSTAR [place]) Inject 24 Units into the skin daily.     Insulin Pen Needle (B-D ULTRAFINE III SHORT PEN) 31G X 8 MM MISC USE AS DIRECTED TO INJECT BASAGLAR EVERY DAY 100 each 3   metFORMIN (GLUCOPHAGE-XR) 500 MG 24 hr tablet Take 1 tablet (500 mg total) by mouth daily with breakfast. 30 tablet 5   metoprolol succinate (TOPROL-XL) 50 MG 24 hr tablet TAKE 1 TABLET(50 MG) BY MOUTH DAILY 90 tablet 3   REPATHA SURECLICK 140 MG/ML SOAJ ADMINISTER 1 ML(140 MG) UNDER THE SKIN EVERY 14 DAYS 6 mL 3   triamcinolone (KENALOG) 0.1 % 1 application to affected area     nitroGLYCERIN (NITROSTAT) 0.4 MG SL tablet Place 1 tablet (0.4 mg total) under the tongue every 5 (five) minutes as needed for chest pain. 90 tablet 3   No current facility-administered medications for this visit.     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 Injections with podiatry not helpful.  CBGs- not checking recently- around 140-150 about a month ago when checking Exercise and diet-  keeping up exercise with walking regularly, feels he could cut back on homemade brand chocolate chip ice cream as well but not super motivated to do so Lab Results  Component Value Date   HGBA1C 8.6 (H) 05/04/2023   HGBA1C 8.6 (H) 02/02/2023   HGBA1C 7.7 (H) 10/10/2022   A/P: Diabetes poorly controlled- trying to work this down and having a hard time with dietary change motivation- wants to add metformin and continue other medications- hesitant to increase insulin due to weight gain issues and hasn't tolerated medications like Ozempic -he will let me know if any low sugars  #CAD-follows with Dr. Sharol Harness RCA but diffuse mild disease #Chronic systolic and diastolic heart failure related to ischemic cardiomyopathy in addition to nonischemic cardiomyopathy #hyperlipidemia with statin intolerance S: Medication:Aspirin 81 mg -For lipids on Repatha -For heart failure on Entresto 97-103 mg, metoprolol extended release 50 mg, Lasix 20 mg daily as needed- daily lately -Has not tolerated spironolactone in the past  No chest pain or shortness of breath other than with hills which is unchanged Lab Results  Component Value Date   CHOL 126 05/04/2023   HDL 42.50 05/04/2023   LDLCALC 31 12/24/2019   LDLDIRECT 64.0 05/04/2023   TRIG 287.0 (H) 05/04/2023   CHOLHDL 3 05/04/2023   A/P: coronary artery disease  largely asymptomatic - continue current medications  CHF-weight up slightly but swelling not up and weight gain seems to be food choice related. Continue current medications is hard once again not having their home kitchen.  - lipids at goal - continue current medications- want triglyceride(s) lower with better a1c control. Vascepa too costly  #health maintenance - plans on COVID shot in october  Recommended follow up: Return in about 4 months (around 09/14/2023) for followup or sooner if needed.Schedule b4 you leave. Future Appointments  Date Time Provider Department Center   07/27/2023  4:00 PM Jodelle Red, MD [facility]  09/01/2023 11:45 AM [facility] ANNUAL WELLNESS VISIT 1 [facility]    Lab/Order associations:   ICD-10-CM   1. Chronic combined systolic and diastolic heart failure (HCC)  G38.75     2. Coronary artery disease due to lipid rich plaque  I25.10    I25.83     3. Poorly controlled diabetes mellitus (HCC)  E11.65     4. Mixed hyperlipidemia  E78.2     5. Drug-induced myopathy  G72.0     6. Need for influenza vaccination  Z23 Flu Vaccine Trivalent High Dose (Fluad)      Meds ordered this encounter  Medications   metFORMIN (GLUCOPHAGE-XR) 500 MG 24 hr tablet    Sig: Take 1 tablet (500 mg total) by mouth  daily with breakfast.    Dispense:  30 tablet    Refill:  5    Return precautions advised.  Tana Conch, MD

## 2023-05-15 NOTE — Patient Instructions (Addendum)
Let us know when you get your COVID vaccine in Oct.  Only change today is trying metformin ER 500 mg again- please stop if worsens diarrhea issues  Recommended follow up: Return in about 4 months (around 09/14/2023) for followup or sooner if needed.Schedule b4 you leave.

## 2023-06-08 ENCOUNTER — Ambulatory Visit (HOSPITAL_BASED_OUTPATIENT_CLINIC_OR_DEPARTMENT_OTHER): Payer: Medicare Other

## 2023-06-26 DIAGNOSIS — H401111 Primary open-angle glaucoma, right eye, mild stage: Secondary | ICD-10-CM | POA: Diagnosis not present

## 2023-06-26 DIAGNOSIS — H524 Presbyopia: Secondary | ICD-10-CM | POA: Diagnosis not present

## 2023-06-26 DIAGNOSIS — Z79899 Other long term (current) drug therapy: Secondary | ICD-10-CM | POA: Diagnosis not present

## 2023-06-26 DIAGNOSIS — H25813 Combined forms of age-related cataract, bilateral: Secondary | ICD-10-CM | POA: Diagnosis not present

## 2023-06-26 DIAGNOSIS — H353131 Nonexudative age-related macular degeneration, bilateral, early dry stage: Secondary | ICD-10-CM | POA: Diagnosis not present

## 2023-07-12 ENCOUNTER — Other Ambulatory Visit: Payer: Self-pay | Admitting: Cardiology

## 2023-07-13 ENCOUNTER — Telehealth: Payer: Self-pay | Admitting: Family Medicine

## 2023-07-13 MED ORDER — GABAPENTIN 100 MG PO CAPS: 100.0000 mg | ORAL_CAPSULE | Freq: Three times a day (TID) | ORAL | 3 refills | Status: DC

## 2023-07-13 NOTE — Telephone Encounter (Signed)
Refill sent to requested pharmacy #90 days

## 2023-07-13 NOTE — Telephone Encounter (Signed)
Prescription Request  07/13/2023  LOV: 05/15/2023  What is the name of the medication or equipment?  gabapentin (NEURONTIN) 100 MG capsule   PLEASE FOR 3 a day for 90DAYS  Have you contacted your pharmacy to request a refill? Yes   Which pharmacy would you like this sent to? [id number] - [place], [place] - [address] [facility] [address]  [place] [address] Phone: [phone] Fax: [phone]   Patient notified that their request is being sent to the clinical staff for review and that they should receive a response within 2 business days.   Please advise at Mobile [phone] (mobile)

## 2023-07-27 ENCOUNTER — Encounter (HOSPITAL_BASED_OUTPATIENT_CLINIC_OR_DEPARTMENT_OTHER): Payer: Self-pay | Admitting: Cardiology

## 2023-07-27 ENCOUNTER — Ambulatory Visit (HOSPITAL_BASED_OUTPATIENT_CLINIC_OR_DEPARTMENT_OTHER): Payer: Medicare Other | Admitting: Cardiology

## 2023-07-27 VITALS — BP 110/64 | HR 77 | Ht 66.0 in | Wt 174.1 lb

## 2023-07-27 DIAGNOSIS — I5042 Chronic combined systolic (congestive) and diastolic (congestive) heart failure: Secondary | ICD-10-CM | POA: Diagnosis not present

## 2023-07-27 DIAGNOSIS — I255 Ischemic cardiomyopathy: Secondary | ICD-10-CM | POA: Diagnosis not present

## 2023-07-27 DIAGNOSIS — I25118 Atherosclerotic heart disease of native coronary artery with other forms of angina pectoris: Secondary | ICD-10-CM | POA: Diagnosis not present

## 2023-07-27 DIAGNOSIS — E1159 Type 2 diabetes mellitus with other circulatory complications: Secondary | ICD-10-CM

## 2023-07-27 DIAGNOSIS — R0609 Other forms of dyspnea: Secondary | ICD-10-CM

## 2023-07-27 DIAGNOSIS — Z789 Other specified health status: Secondary | ICD-10-CM

## 2023-07-27 DIAGNOSIS — Z794 Long term (current) use of insulin: Secondary | ICD-10-CM

## 2023-07-27 DIAGNOSIS — E782 Mixed hyperlipidemia: Secondary | ICD-10-CM

## 2023-07-27 NOTE — Patient Instructions (Addendum)
Medication Instructions:  Your physician recommends that you continue on your current medications as directed. Please refer to the Current Medication list given to you today.  *If you need a refill on your cardiac medications before your next appointment, please call your pharmacy*  Lab Work: NONE  Testing/Procedures: NONE  Follow-Up: At [facility], you and your health needs are our priority.  As part of our continuing mission to provide you with exceptional heart care, we have created designated Provider Care Teams.  These Care Teams include your primary Cardiologist (physician) and Advanced Practice Providers (APPs -  Physician Assistants and Nurse Practitioners) who all work together to provide you with the care you need, when you need it.  We recommend signing up for the patient portal called "MyChart".  Sign up information is provided on this After Visit Summary.  MyChart is used to connect with patients for Virtual Visits (Telemedicine).  Patients are able to view lab/test results, encounter notes, upcoming appointments, etc.  Non-urgent messages can be sent to your provider as well.   To learn more about what you can do with MyChart, go to [email or web address].    Your next appointment:   6 month(s)  The format for your next appointment:   In Person  Provider:   Jodelle Red, MD       For your wife: Info on Ofev  Mechanism of Action ?i?tedanib inhibits multiple receptor tyrosine kinases (RTKs) and nonreceptor tyrosine kinases (nRTKs), including platelet-derived growth factor (PDGFR alpha and PDGFR beta); fibroblast growth factor receptor (FGFR1, FGFR2, FGFR3); vascular endothelial growth factor (VEGFR1, VEGFR2, and VEGFR3); colony-stimulating factor 1 receptor (CSF1R); and Fms-like tyrosine kinase-3 (FLT3). ?i?t???nib binds competitively to the adenosine triphosphate (ATP) binding pocket of these receptors and blocks the intracellular signaling which is  crucial for the proliferation, migration, and transformation of fibroblasts.   Adverse Reactions The following adverse drug reactions and incidences are derived from product labeling unless otherwise specified. Adverse reactions reported in adults.  >10%: Dermatologic: Dermal ulcer (18%) Endocrine & metabolic: Weight loss (10% to 82%) Gastrointestinal: Abdominal pain (15% to 18%), decreased appetite (9% to 11%), diarrhea (62% to 76%), nausea (24% to 32%), vomiting (12% to 25%) Hepatic: Increased liver enzymes (13% to 14%) Nervous system: Fatigue (10% to 11%) Respiratory: Nasopharyngitis (13%)  1% to 10%: Cardiovascular: Acute myocardial infarction (2%), arterial thrombosis (3%), hypertension (5%) Endocrine & metabolic: Hypothyroidism (1%) Genitourinary: Urinary tract infection (6%) Hematologic & oncologic: Hemorrhage (10%; can be major hemorrhage) Nervous system: Dizziness (6%), headache (8% to 9%) Neuromuscular & skeletal: Back pain (6%) Respiratory: Bronchitis (1%), pneumonia (3% to 4%), upper respiratory tract infection (7%) Miscellaneous: Fever (6%)  <1%: Gastrointestinal: Gastrointestinal perforation  Postmarketing: Dermatologic: Pruritus, skin rash Gastrointestinal: Pancreatitis Genitourinary: Proteinuria Hematologic & oncologic: Thrombocytopenia Hepatic: Hepatoxicity Nervous system: Posterior reversible encephalopathy syndrome

## 2023-07-27 NOTE — Progress Notes (Signed)
Cardiology Office Note:  .    Date:  07/28/2023  ID:  Casey Lynch, DOB 05-04-1946, MRN [id number] PCP: Shelva Majestic, MD  [facility] Providers Cardiologist:  Jodelle Red, MD     History of Present Illness: .    Casey Lynch is a 77 y.o. male with a hx of with CAD, chronic systolic and diastolic heart failure, ischemic cardiomyopathy, hyperlipidemia, PAD (carotid bruit <50% bilaterally, left subclavian steal syndrome), COPD, former tobacco abuse, OSA on CPAP, type II diabetes, here for follow up. I met him 07/11/2018 and his initial cardiac evaluation from [place] is summarized in that note.   Today: Feeling a bit more short of breath on exertion. Notices that when he is at the end of his usual walk, there is a long hill. He is able to complete it but is feeling more winded than he had been in the past. Has some chest tightness but no severe pain with incline walking.   Walks 2 miles/day 5 times/week.   ROS:  Denies shortness of breath at rest. No PND, orthopnea, LE edema or unexpected weight gain. No syncope or palpitations. ROS otherwise negative except as noted.   Studies Reviewed: Marland Kitchen         Physical Exam:    VS:  BP 110/64   Pulse 77   Ht 5\' 6"  (1.676 m)   Wt 174 lb 1.6 oz (79 kg)   SpO2 94%   BMI 28.10 kg/m    Wt Readings from Last 3 Encounters:  07/27/23 174 lb 1.6 oz (79 kg)  05/15/23 175 lb 6.4 oz (79.6 kg)  03/02/23 171 lb (77.6 kg)    GEN: Well nourished, well developed in no acute distress HEENT: Normal, moist mucous membranes NECK: No JVD CARDIAC: regular rhythm, normal S1 and S2, no rubs or gallops. No murmur. VASCULAR: Radial and DP pulses 2+ bilaterally. No carotid bruits RESPIRATORY:  Clear to auscultation without rales, wheezing or rhonchi  ABDOMEN: Soft, non-tender, non-distended MUSCULOSKELETAL:  Ambulates independently SKIN: Warm and dry, no edema NEUROLOGIC:  Alert and oriented x 3. No focal neuro deficits  noted. PSYCHIATRIC:  Normal affect   ASSESSMENT AND PLAN: .    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:   -NYHA class II, EF improved to 50-55% on 08/2020 echo (was 40-45%) -tolerating maximum dose entresto, continue -tolerating metoprolol succinate 50 mg daily for stable angina -did not tolerate spironolactone per report -did not tolerate jardiance; now tolerating farxiga   CAD (predominantly RCA, but diffuse mild disease), with stable angina: -tolerating metoprolol succinate 50 mg daily for stable angina -on aspirin 81 mg -avoid long term NSAIDs given CAD -lipids as below -discussed red flag warning signs that need immediate medical attention, such as chest tightness that does not resolve with rest   Mixed hyperlipidemia, statin intolerance: -LDL goal <70 -doing well on repatha, last LDL 45 -could not afford vascepa at >$500 -last TG 262 but unclear if fasting, but focusing on lifestyle/diabetes control. Continue to monitor   Type II diabetes, on insulin -has neuropathy and vascular disease as  diabetic complication -last A1c 8.6 -has had some weight gain on glargine -tolerating Farxiga   Cardiac risk counseling and prevention recommendations: -recommend heart healthy/Mediterranean diet, with whole grains, fruits, vegetable, fish, lean meats, nuts, and olive oil. Limit salt. -recommend moderate walking, 3-5 times/week for  30-50 minutes each session. Aim for at least 150 minutes.week. Goal should be pace of 3 miles/hours, or walking 1.5 miles in 30 minutes -recommend avoidance of tobacco products. Avoid excess alcohol.  Dispo: Follow-up in 6 months, or sooner as needed.  Signed, Jodelle Red, MD

## 2023-08-03 ENCOUNTER — Other Ambulatory Visit: Payer: Self-pay | Admitting: Family Medicine

## 2023-08-24 DIAGNOSIS — M1120 Other chondrocalcinosis, unspecified site: Secondary | ICD-10-CM | POA: Diagnosis not present

## 2023-08-24 DIAGNOSIS — M0609 Rheumatoid arthritis without rheumatoid factor, multiple sites: Secondary | ICD-10-CM | POA: Diagnosis not present

## 2023-08-24 DIAGNOSIS — M1991 Primary osteoarthritis, unspecified site: Secondary | ICD-10-CM | POA: Diagnosis not present

## 2023-08-24 DIAGNOSIS — Z79899 Other long term (current) drug therapy: Secondary | ICD-10-CM | POA: Diagnosis not present

## 2023-08-24 DIAGNOSIS — E089 Diabetes mellitus due to underlying condition without complications: Secondary | ICD-10-CM | POA: Diagnosis not present

## 2023-08-24 LAB — COMPREHENSIVE METABOLIC PANEL
Albumin: 4.2 (ref 3.5–5.0)
Calcium: 9.9 (ref 8.7–10.7)
Globulin: 2.8

## 2023-08-24 LAB — BASIC METABOLIC PANEL
BUN: 26 — AB (ref 4–21)
Creatinine: 1.5 — AB (ref 0.6–1.3)
Glucose: 203
Potassium: 5.2 meq/L — AB (ref 3.5–5.1)
Sodium: 138 (ref 137–147)

## 2023-08-24 LAB — HEPATIC FUNCTION PANEL
ALT: 22 U/L (ref 10–40)
AST: 17 (ref 14–40)
Alkaline Phosphatase: 126 — AB (ref 25–125)
Bilirubin, Total: 0.3

## 2023-08-24 LAB — HEMOGLOBIN A1C: Hemoglobin A1C: 8.7

## 2023-08-24 LAB — CBC AND DIFFERENTIAL
HCT: 43 (ref 41–53)
Hemoglobin: 14 (ref 13.5–17.5)
Platelets: 305 10*3/uL (ref 150–400)
WBC: 7.9

## 2023-08-24 LAB — CBC: RBC: 4.64 (ref 3.87–5.11)

## 2023-08-25 LAB — LAB REPORT - SCANNED
A1c: 8.7
EGFR: 48

## 2023-08-28 ENCOUNTER — Ambulatory Visit (INDEPENDENT_AMBULATORY_CARE_PROVIDER_SITE_OTHER): Payer: Medicare Other

## 2023-08-28 VITALS — Wt 174.0 lb

## 2023-08-28 DIAGNOSIS — Z0000 Encounter for general adult medical examination without abnormal findings: Secondary | ICD-10-CM | POA: Diagnosis not present

## 2023-08-28 NOTE — Progress Notes (Signed)
Subjective:   Casey Lynch is a 77 y.o. male who presents for Medicare Annual/Subsequent preventive examination.  Visit Complete: Virtual I connected with  Casey Lynch on 08/28/23 by a audio enabled telemedicine application and verified that I am speaking with the correct person using two identifiers.  Patient Location: Home  Provider Location: Office/Clinic  I discussed the limitations of evaluation and management by telemedicine. The patient expressed understanding and agreed to proceed.  Vital Signs: Because this visit was a virtual/telehealth visit, some criteria may be missing or patient reported. Any vitals not documented were not able to be obtained and vitals that have been documented are patient reported.  Patient Medicare AWV questionnaire was completed by the patient on 08/24/23; I have confirmed that all information answered by patient is correct and no changes since this date.  Cardiac Risk Factors include: advanced age (>52men, >21 women);diabetes mellitus;dyslipidemia;hypertension;male gender     Objective:    Today's Vitals   08/28/23 0813  Weight: 174 lb (78.9 kg)   Body mass index is 28.08 kg/m.     08/28/2023    8:17 AM 08/25/2022   11:38 AM 08/16/2021   11:40 AM 10/24/2016    8:37 AM  Advanced Directives  Does Patient Have a Medical Advance Directive? Yes Yes Yes Yes  Type of Estate agent of [place];Living will Healthcare Power of [place];Living will Healthcare Power of [employer] of [place];Living will  Does patient want to make changes to medical advance directive? No - Patient declined No - Patient declined    Copy of Healthcare Power of Attorney in Chart? Yes - validated most recent copy scanned in chart (See row information) Yes - validated most recent copy scanned in chart (See row information) Yes - validated most recent copy scanned in chart (See row information) No - copy requested    Current  Medications (verified) Outpatient Encounter Medications as of 08/28/2023  Medication Sig   aspirin EC 81 MG tablet Take 81 mg by mouth daily. Swallow whole.   B Complex Vitamins (VITAMIN B COMPLEX) TABS Take by mouth.   Cholecalciferol (VITAMIN D3 PO) Take 1 capsule by mouth daily.   colchicine 0.6 MG tablet Take 1 tablet (0.6 mg total) by mouth daily.   CONTOUR NEXT TEST test strip    dapagliflozin propanediol (FARXIGA) 10 MG TABS tablet TAKE 1 TABLET(10 MG) BY MOUTH DAILY   ENTRESTO 97-103 MG TAKE 1 TABLET BY MOUTH TWICE DAILY   furosemide (LASIX) 20 MG tablet Take 1 tablet (20 mg total) by mouth daily as needed for edema.   gabapentin (NEURONTIN) 100 MG capsule Take 1 capsule (100 mg total) by mouth 3 (three) times daily.   hydroxychloroquine (PLAQUENIL) 200 MG tablet Take 200 mg by mouth 2 (two) times daily.   Insulin Glargine (TOUJEO MAX SOLOSTAR Triana) Inject 24 Units into the skin daily.   Insulin Pen Needle (B-D ULTRAFINE III SHORT PEN) 31G X 8 MM MISC USE AS DIRECTED TO INJECT BASAGLAR EVERY DAY   metFORMIN (GLUCOPHAGE-XR) 500 MG 24 hr tablet TAKE 1 TABLET(500 MG) BY MOUTH DAILY WITH BREAKFAST   metoprolol succinate (TOPROL-XL) 50 MG 24 hr tablet TAKE 1 TABLET(50 MG) BY MOUTH DAILY   REPATHA SURECLICK 140 MG/ML SOAJ ADMINISTER 1 ML(140 MG) UNDER THE SKIN EVERY 14 DAYS   triamcinolone (KENALOG) 0.1 % 1 application to affected area   nitroGLYCERIN (NITROSTAT) 0.4 MG SL tablet Place 1 tablet (0.4 mg total) under the tongue every 5 (  five) minutes as needed for chest pain.   No facility-administered encounter medications on file as of 08/28/2023.    Allergies (verified) Jardiance [empagliflozin], Statins, and Tetanus toxoids   History: Past Medical History:  Diagnosis Date   Carotid bruit    Diabetes mellitus without complication (HCC)    Eczema    Heart failure (HCC)    History of colonic polyps    Hypercholesterolemia    Hyperlipidemia    Macular degeneration     Osteoarthritis    Paresthesias    Pseudogout    Subclavian steal syndrome    Past Surgical History:  Procedure Laterality Date   CARDIAC CATHETERIZATION     COLONOSCOPY WITH PROPOFOL N/A 10/24/2016   Procedure: COLONOSCOPY WITH PROPOFOL;  Surgeon: Charolett Bumpers, MD;  Location: [facility];  Service: Endoscopy;  Laterality: N/A;   HYDROCELE EXCISION / REPAIR     KNEE SURGERY     REFRACTIVE SURGERY     TONSILLECTOMY     Family History  Problem Relation Age of Onset   Heart failure Father    Bipolar disorder Brother    Diabetes Brother    Cancer Paternal Grandmother    Social History   Socioeconomic History   Marital status: Married    Spouse name: Not on file   Number of children: Not on file   Years of education: Not on file   Highest education level: Doctorate  Occupational History   Not on file  Tobacco Use   Smoking status: Former    Current packs/day: 0.00    Average packs/day: 1 pack/day for 59.0 years (59.0 ttl pk-yrs)    Types: Cigarettes    Start date: 09/05/1957    Quit date: 09/05/2016    Years since quitting: 6.9   Smokeless tobacco: Never  Vaping Use   Vaping status: Never Used  Substance and Sexual Activity   Alcohol use: Not Currently    Comment: Rare   Drug use: No   Sexual activity: Yes  Other Topics Concern   Not on file  Social History Narrative   Married. 2 children. 3 grandkids.       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    - congregational [employer]      Hobbies: golf, woodworking, fly tying for fly fishing   Social Drivers of Health   Financial Resource Strain: Low Risk  (08/28/2023)   Overall Financial Resource Strain (CARDIA)    Difficulty of Paying Living Expenses: Not hard at all  Food Insecurity: No Food Insecurity (08/28/2023)   Hunger Vital Sign    Worried About Running Out of Food in the Last Year: Never true    Ran Out of Food in the Last Year: Never true  Transportation Needs: No  Transportation Needs (08/28/2023)   PRAPARE - Administrator, Civil Service (Medical): No    Lack of Transportation (Non-Medical): No  Physical Activity: Sufficiently Active (08/28/2023)   Exercise Vital Sign    Days of Exercise per Week: 6 days    Minutes of Exercise per Session: 100 min  Stress: No Stress Concern Present (08/28/2023)   [employer] of Occupational Health - Occupational Stress Questionnaire    Feeling of Stress : Not at all  Social Connections: Socially Integrated (08/28/2023)   Social Connection and Isolation Panel [NHANES]    Frequency of Communication with Friends and Family: More than three times a week    Frequency of  Social Gatherings with Friends and Family: More than three times a week    Attends Religious Services: More than 4 times per year    Active Member of [employer] or Organizations: Yes    Attends Banker Meetings: 1 to 4 times per year    Marital Status: Married    Tobacco Counseling Counseling given: Not Answered   Clinical Intake:  Pre-visit preparation completed: Yes  Pain : No/denies pain     BMI - recorded: 28.08 Nutritional Status: BMI 25 -29 Overweight Nutritional Risks: None Diabetes: Yes CBG done?: No Did pt. bring in CBG monitor from home?: No  How often do you need to have someone help you when you read instructions, pamphlets, or other written materials from your doctor or pharmacy?: 1 - Never  Interpreter Needed?: No  Information entered by :: Lanier Ensign, LPN   Activities of Daily Living    08/24/2023    7:02 PM  In your present state of health, do you have any difficulty performing the following activities:  Hearing? 0   Vision? 0   Difficulty concentrating or making decisions? 0   Walking or climbing stairs? 0   Dressing or bathing? 0   Doing errands, shopping? 0   Preparing Food and eating ? N   Using the Toilet? N   In the past six months, have you accidently leaked urine? N    Do you have problems with loss of bowel control? N   Managing your Medications? N   Managing your Finances? N   Housekeeping or managing your Housekeeping? N      Proxy-reported    Patient Care Team: Shelva Majestic, MD as PCP - General (Family Medicine) Jodelle Red, MD as PCP - Cardiology (Cardiology) Zenovia Jordan, MD as Consulting Physician (Rheumatology) Chilton Greathouse, MD as Consulting Physician (Pulmonary Disease) Erroll Luna, [facility] (Inactive) as Pharmacist (Pharmacist) Donzetta Starch, MD as Consulting Physician (Dermatology)  Indicate any recent Medical Services you may have received from other than [facility] providers in the past year (date may be approximate).     Assessment:   This is a routine wellness examination for Casey Lynch.  Hearing/Vision screen Hearing Screening - Comments:: Pt denies any hearing issues  Vision Screening - Comments:: Pt follows up with Dr Elmer Picker for annual eye exams    Goals Addressed             This Visit's Progress    Patient Stated       Maintain health and activity        Depression Screen    08/28/2023    8:18 AM 05/15/2023   12:56 PM 08/25/2022   11:36 AM 08/16/2021   11:39 AM 06/21/2021    2:26 PM 11/25/2020   10:34 AM  PHQ 2/9 Scores  PHQ - 2 Score 0 0 0 0 0 0  PHQ- 9 Score  2        Fall Risk    08/24/2023    7:02 PM 08/25/2022   11:39 AM 08/16/2021   11:40 AM 07/13/2021    8:25 AM 06/21/2021    2:25 PM  Fall Risk   Falls in the past year? 0  0 1 0 1  Number falls in past yr: 0  0 1  0  Injury with Fall? 0  0 1  0  Comment   bruised    Risk for fall due to : Impaired balance/gait Impaired balance/gait;Impaired vision Impaired vision  No Fall  Risks  Risk for fall due to: Comment not as great as use to be      Follow up Falls prevention discussed Falls prevention discussed Falls prevention discussed  Falls evaluation completed     Proxy-reported    MEDICARE RISK AT HOME: Medicare Risk at Home Any  stairs in or around the home?: (Proxy-Rptd) Yes If so, are there any without handrails?: (Proxy-Rptd) Yes Home free of loose throw rugs in walkways, pet beds, electrical cords, etc?: (Proxy-Rptd) Yes Adequate lighting in your home to reduce risk of falls?: (Proxy-Rptd) Yes Life alert?: (Proxy-Rptd) No Use of a cane, walker or w/c?: (Proxy-Rptd) No Grab bars in the bathroom?: (Proxy-Rptd) Yes Shower chair or bench in shower?: (Proxy-Rptd) Yes Elevated toilet seat or a handicapped toilet?: (Proxy-Rptd) No  TIMED UP AND GO:  Was the test performed?  No    Cognitive Function:        08/28/2023    8:20 AM 08/25/2022   11:40 AM 08/16/2021   11:42 AM  6CIT Screen  What Year? 0 points 0 points 0 points  What month? 0 points 0 points 0 points  What time? 0 points 0 points 0 points  Count back from 20 0 points 0 points 0 points  Months in reverse 0 points 0 points 0 points  Repeat phrase 0 points 2 points 0 points  Total Score 0 points 2 points 0 points    Immunizations Immunization History  Administered Date(s) Administered   Fluad Quad(high Dose 65+) 04/29/2019, 06/21/2021, 06/07/2022   Fluad Trivalent(High Dose 65+) 05/15/2023   Influenza, High Dose Seasonal PF 07/05/2017   MODERNA COVID-19 SARS-COV-2 PEDS BIVALENT BOOSTER 8yr-29yr 05/25/2022   PFIZER Comirnaty(Gray Top)Covid-19 Tri-Sucrose Vaccine 10/03/2019, 10/25/2019, 06/01/2020, 06/10/2021   PNEUMOCOCCAL CONJUGATE-20 07/04/2022   Pfizer(Comirnaty)Fall Seasonal Vaccine 12 years and older 07/20/2023   Pneumococcal Conjugate-13 07/03/2014   Pneumococcal Polysaccharide-23 09/14/2008   Unspecified SARS-COV-2 Vaccination 06/10/2021   Zoster Recombinant(Shingrix) 02/19/2019, 06/04/2019      Flu Vaccine status: Up to date  Pneumococcal vaccine status: Up to date  Covid-19 vaccine status: Information provided on how to obtain vaccines.   Qualifies for Shingles Vaccine? Yes   Zostavax completed Yes   Shingrix Completed?:  Yes  Screening Tests Health Maintenance  Topic Date Due   OPHTHALMOLOGY EXAM  05/27/2023   FOOT EXAM  07/05/2023   COVID-19 Vaccine (7 - 2024-25 season) 09/14/2023   HEMOGLOBIN A1C  11/03/2023   Diabetic kidney evaluation - eGFR measurement  05/03/2024   Diabetic kidney evaluation - Urine ACR  05/03/2024   Medicare Annual Wellness (AWV)  08/27/2024   INFLUENZA VACCINE  Completed   Hepatitis C Screening  Completed   Zoster Vaccines- Shingrix  Completed   HPV VACCINES  Aged Out   Lung Cancer Screening  Discontinued   DTaP/Tdap/Td  Discontinued   Pneumonia Vaccine 4+ Years old  Discontinued   Colonoscopy  Discontinued    Health Maintenance  Health Maintenance Due  Topic Date Due   OPHTHALMOLOGY EXAM  05/27/2023   FOOT EXAM  07/05/2023    Colorectal cancer screening: No longer required.    Additional Screening:  Hepatitis C Screening: Completed 10/01/20  Vision Screening: Recommended annual ophthalmology exams for early detection of glaucoma and other disorders of the eye. Is the patient up to date with their annual eye exam?  Yes  Who is the provider or what is the name of the office in which the patient attends annual eye exams? [facility]  If  pt is not established with a provider, would they like to be referred to a provider to establish care? No .   Dental Screening: Recommended annual dental exams for proper oral hygiene  Diabetic Foot Exam: Diabetic Foot Exam: Overdue, Pt has been advised about the importance in completing this exam. Pt is scheduled for diabetic foot exam on next appt .  Community Resource Referral / Chronic Care Management: CRR required this visit?  No   CCM required this visit?  No     Plan:     I have personally reviewed and noted the following in the patient's chart:   Medical and social history Use of alcohol, tobacco or illicit drugs  Current medications and supplements including opioid prescriptions. Patient is not currently taking  opioid prescriptions. Functional ability and status Nutritional status Physical activity Advanced directives List of other physicians Hospitalizations, surgeries, and ER visits in previous 12 months Vitals Screenings to include cognitive, depression, and falls Referrals and appointments  In addition, I have reviewed and discussed with patient certain preventive protocols, quality metrics, and best practice recommendations. A written personalized care plan for preventive services as well as general preventive health recommendations were provided to patient.     Marzella Schlein, LPN   16/06/[id number]   After Visit Summary: (MyChart) Due to this being a telephonic visit, the after visit summary with patients personalized plan was offered to patient via MyChart   Nurse Notes: none

## 2023-08-28 NOTE — Patient Instructions (Signed)
Casey Lynch , Thank you for taking time to come for your Medicare Wellness Visit. I appreciate your ongoing commitment to your health goals. Please review the following plan we discussed and let me know if I can assist you in the future.   Referrals/Orders/Follow-Ups/Clinician Recommendations: maintain health and activity   This is a list of the screening recommended for you and due dates:  Health Maintenance  Topic Date Due   Eye exam for diabetics  05/27/2023   Complete foot exam   07/05/2023   Medicare Annual Wellness Visit  08/26/2023   COVID-19 Vaccine (7 - 2024-25 season) 09/14/2023   Hemoglobin A1C  11/03/2023   Yearly kidney function blood test for diabetes  05/03/2024   Yearly kidney health urinalysis for diabetes  05/03/2024   Flu Shot  Completed   Hepatitis C Screening  Completed   Zoster (Shingles) Vaccine  Completed   HPV Vaccine  Aged Out   Screening for Lung Cancer  Discontinued   DTaP/Tdap/Td vaccine  Discontinued   Pneumonia Vaccine  Discontinued   Colon Cancer Screening  Discontinued    Advanced directives: (In Chart) A copy of your advanced directives are scanned into your chart should your provider ever need it.  Next Medicare Annual Wellness Visit scheduled for next year: Yes

## 2023-09-04 ENCOUNTER — Telehealth: Payer: Self-pay | Admitting: Family Medicine

## 2023-09-04 NOTE — Telephone Encounter (Signed)
Patient called after hours line stating he got labs done at [facility] but wanted to know if we had access to these. Please Advise.    Patient Name First: Casey Last: Lynch Gender: Male DOB: 1945-10-14 Age: 77 Y 4 M 13 D Return Phone Number: [phone] (Primary), [phone] (Secondary) Address: City/ State/ Zip: [place]  [address] Client [facility] - Human resources officer [facility] Provider Tana Conch- MD Contact Type Call Who Is Calling Patient / Member / Family / Caregiver Call Type Triage / Clinical Relationship To Patient Self Return Phone Number [phone] (Primary) Chief Complaint Health information question (non symptomatic) Reason for Call Request for Lab/Test Results Initial Comment Caller states he just got labs done and they are in [facility] and he is wanting to know if we have access. No symptoms Translation No Nurse Assessment Nurse: Tillman Abide, RN, Debra Date/Time (Eastern Time): 09/02/2023 3:29:33 PM Confirm and document reason for call. If symptomatic, describe symptoms. ---caller states is supposed to get labs drawn 1/6. just had blood drawn from a different md. wants to know if we have access to those to send to him or to pcp. denies symptoms. Does the patient have any new or worsening symptoms? ---No Please document clinical information provided and list any resource used. ---advised we do not have access to those after hours. recommended to call office that ordered original labs monday to see if they will send results to pcp office or to pt to take to pcp office. verbalizes understanding. Disp. Time Lamount Cohen Time) Disposition Final User 09/02/2023 10:29:11 AM Send To Nurse Gillian Scarce, RN, Nash Dimmer 09/02/2023 10:36:31 AM Send To Clinical Follow Up Ned Clines, RN, Sarah 09/02/2023 3:35:53 PM Clinical Call Yes Tillman Abide, RN, Debra Final Disposition 09/02/2023 3:35:53 PM  Clinical Call Yes Tillman Abide, RN, Stanton Kidney

## 2023-09-07 DIAGNOSIS — H52213 Irregular astigmatism, bilateral: Secondary | ICD-10-CM | POA: Diagnosis not present

## 2023-09-07 DIAGNOSIS — H25813 Combined forms of age-related cataract, bilateral: Secondary | ICD-10-CM | POA: Diagnosis not present

## 2023-09-07 DIAGNOSIS — H353131 Nonexudative age-related macular degeneration, bilateral, early dry stage: Secondary | ICD-10-CM | POA: Diagnosis not present

## 2023-09-07 DIAGNOSIS — H524 Presbyopia: Secondary | ICD-10-CM | POA: Diagnosis not present

## 2023-09-07 DIAGNOSIS — M069 Rheumatoid arthritis, unspecified: Secondary | ICD-10-CM | POA: Diagnosis not present

## 2023-09-07 LAB — HM DIABETES EYE EXAM

## 2023-09-07 NOTE — Telephone Encounter (Signed)
 I can see labs Dr. Nickola Major ordered yes- can discuss at 09/18/23 visit

## 2023-09-07 NOTE — Telephone Encounter (Signed)
 Called and made pt spouse aware of below message.

## 2023-09-07 NOTE — Telephone Encounter (Signed)
 FYI, Labs scanned in on 08/25/23 that were ordered by Zenovia Jordan.

## 2023-09-11 ENCOUNTER — Other Ambulatory Visit: Payer: Self-pay | Admitting: Family Medicine

## 2023-09-18 ENCOUNTER — Ambulatory Visit (INDEPENDENT_AMBULATORY_CARE_PROVIDER_SITE_OTHER): Payer: Medicare Other | Admitting: Family Medicine

## 2023-09-18 ENCOUNTER — Encounter: Payer: Self-pay | Admitting: Family Medicine

## 2023-09-18 VITALS — BP 127/81 | HR 68 | Temp 98.4°F | Ht 66.0 in | Wt 174.6 lb

## 2023-09-18 DIAGNOSIS — I2510 Atherosclerotic heart disease of native coronary artery without angina pectoris: Secondary | ICD-10-CM

## 2023-09-18 DIAGNOSIS — I5042 Chronic combined systolic (congestive) and diastolic (congestive) heart failure: Secondary | ICD-10-CM | POA: Diagnosis not present

## 2023-09-18 DIAGNOSIS — E782 Mixed hyperlipidemia: Secondary | ICD-10-CM | POA: Diagnosis not present

## 2023-09-18 DIAGNOSIS — I2583 Coronary atherosclerosis due to lipid rich plaque: Secondary | ICD-10-CM

## 2023-09-18 DIAGNOSIS — G720 Drug-induced myopathy: Secondary | ICD-10-CM

## 2023-09-18 DIAGNOSIS — J439 Emphysema, unspecified: Secondary | ICD-10-CM

## 2023-09-18 DIAGNOSIS — E1159 Type 2 diabetes mellitus with other circulatory complications: Secondary | ICD-10-CM | POA: Diagnosis not present

## 2023-09-18 DIAGNOSIS — M0600 Rheumatoid arthritis without rheumatoid factor, unspecified site: Secondary | ICD-10-CM

## 2023-09-18 DIAGNOSIS — Z794 Long term (current) use of insulin: Secondary | ICD-10-CM | POA: Diagnosis not present

## 2023-09-18 MED ORDER — DEXCOM G7 SENSOR MISC: 3 refills | Status: DC

## 2023-09-18 MED ORDER — METFORMIN HCL ER 500 MG PO TB24: 500.0000 mg | ORAL_TABLET | Freq: Two times a day (BID) | ORAL | 3 refills | Status: DC

## 2023-09-18 MED ORDER — FARXIGA 10 MG PO TABS: 10.0000 mg | ORAL_TABLET | Freq: Every day | ORAL | 3 refills | Status: DC

## 2023-09-18 NOTE — Progress Notes (Signed)
 Phone [phone] In person visit   Subjective:   Casey Lynch is a 78 y.o. 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    Priority: Medium    Seronegative rheumatoid arthritis (HCC) 10/14/2021    Priority: Medium    Pseudogout involving multiple joints 10/01/2020    Priority: Medium    Mixed hyperlipidemia 07/12/2018    Priority: Medium    Statin intolerance 07/12/2018    Priority: Medium    Pulmonary HTN (HCC) 06/14/2018    Priority: Medium    COVID-19 07/13/2021    Priority: Low   Personal history of COVID-19 10/25/2020    Priority: Low   Pulmonary emphysema (HCC) 06/14/2018    Priority: Low   Type 2 diabetes mellitus with other circulatory complication, with long-term current use of insulin  (HCC) 09/18/2023    Medications- reviewed and updated Current Outpatient Medications  Medication Sig Dispense Refill   Continuous Glucose Sensor (DEXCOM G7 SENSOR) MISC Apply sensor every 10 days. Pharmacy please provide 3 boxes (1 sensor per week) 9 each 3   FARXIGA  10 MG TABS tablet Take 1 tablet (10 mg total) by mouth daily before breakfast. 90 tablet 3   aspirin EC 81 MG tablet Take 81 mg by mouth daily. Swallow whole.     B Complex Vitamins (VITAMIN B COMPLEX) TABS Take by mouth.     Cholecalciferol (VITAMIN D3 PO) Take 1 capsule by mouth daily.     colchicine  0.6 MG tablet Take 1 tablet (0.6 mg  total) by mouth daily. 90 tablet 0   CONTOUR NEXT TEST test strip      ENTRESTO  97-103 MG TAKE 1 TABLET BY MOUTH TWICE DAILY 180 tablet 3   furosemide  (LASIX ) 20 MG tablet Take 1 tablet (20 mg total) by mouth daily as needed for edema. 90 tablet 3   gabapentin  (NEURONTIN ) 100 MG capsule Take 1 capsule (100 mg total) by mouth 3 (three) times daily. 270 capsule 3   hydroxychloroquine (PLAQUENIL) 200 MG tablet Take 200 mg by mouth 2 (two) times daily.     Insulin  Glargine (TOUJEO  MAX SOLOSTAR [place]) Inject 24 Units into the skin daily.     Insulin  Pen Needle (B-D ULTRAFINE III SHORT PEN) 31G X 8 MM MISC USE AS DIRECTED TO INJECT BASAGLAR  EVERY DAY 100 each 3   metFORMIN  (GLUCOPHAGE -XR) 500 MG 24 hr tablet Take 1 tablet (500 mg total) by mouth 2 (two) times daily with a meal. 180 tablet 3   metoprolol  succinate (TOPROL -XL) 50 MG 24 hr tablet TAKE 1 TABLET(50 MG) BY MOUTH DAILY 90 tablet 3   nitroGLYCERIN  (NITROSTAT ) 0.4 MG SL tablet Place 1 tablet (0.4 mg total) under the tongue every 5 (five) minutes as needed for chest pain. 90 tablet 3   REPATHA  SURECLICK 140 MG/ML SOAJ ADMINISTER 1 ML(140 MG) UNDER THE SKIN EVERY 14 DAYS 6  mL 3   triamcinolone  (KENALOG ) 0.1 % 1 application to affected area     No current facility-administered medications for this visit.     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  CBGs- not checking lately, wants to try dexcom Exercise and diet- still enjoys sugar and carbohydrates which is a barrier Lab Results  Component Value Date   HGBA1C 8.6 (H) 05/04/2023   HGBA1C 8.6 (H) 02/02/2023   HGBA1C 7.7 (H) 10/10/2022   A/P: diabetes is poorly controlled with a1c 8.7 on 08/24/23- has room to improve diet but not at top of his priority list- monitoring may help- will try for Dexcom if covered - increase metformin  to one in morning and one in evening so 1000mg  total per day -likely need to increase his toujeo  but id like some morning readings before we do that- he will update me 2 weeks after getting Dexcom-if not covered can do fingerstick  #CAD-follows with Dr. Edyth RCA but diffuse mild disease #Chronic systolic and diastolic heart failure related to ischemic cardiomyopathy in addition to nonischemic cardiomyopathy #hyperlipidemia with statin intolerance #Aortic atherosclerosis-incidental finding lung cancer screening  S: Medication:Aspirin 81 mg -For lipids on Repatha , vascepa  too costly -For heart failure on Entresto  97-103 mg, metoprolol  extended release 50 mg, Lasix  20 mg daily as needed- needing most days - no chest pain. Stable shortness of breath  Lab Results  Component Value Date   CHOL 126 05/04/2023   HDL 42.50 05/04/2023   LDLCALC 31 12/24/2019   LDLDIRECT 64.0 05/04/2023   TRIG 287.0 (H) 05/04/2023   CHOLHDL 3 05/04/2023   A/P: coronary artery disease largely asymptomatic - continue current medications  CHF- stable weight and edema- appears euvolemic- continue current medications Lipids- above goal for triglyceride(s)- bringing a1c down will help- we are working on that- also cutting down on some dietary  indescretions may help Aortic atherosclerosis (presumed stable)- LDL goal ideally <70 - at goal continue current medications    #COPD/former smoker S: medication: none. Mild dyspnea on exertion  A/P: doing well- continue to monitor   -in lung cancer screening program   # GERD- no recent issues  #Seronegative RA per Dr. Ishmael- has bene told psoriatic arthritis S: on hydroxychloroquine 200mg   for possible psoriatic arthritis, on turmeric.  Follows with Dr. Ishmael A/P: doing well- continue current medications    # psuedogout S:Colchicine  through Dr. Ishmael  -started on b complex as well- apparently had lows A/P: no recent issues as long as on colichicine regularly- flare when stopped   #Team please request eye records- Dr.  Fleeta with [facility] please  Recommended follow up: Return in about 3 months (around 12/17/2023) for followup or sooner if needed.Schedule b4 you leave. Future Appointments  Date Time Provider Department Center  09/03/2024  8:00 AM [facility] ANNUAL WELLNESS VISIT 1 [facility]   Lab/Order associations:   ICD-10-CM   1. Type 2 diabetes mellitus with other circulatory complication, with long-term current use of insulin  (HCC) Chronic E11.59    Z79.4     2. Chronic combined systolic and diastolic heart failure (HCC)  P49.57     3. Mixed hyperlipidemia  E78.2     4. Coronary artery disease due to lipid rich plaque  I25.10    I25.83     5. Seronegative rheumatoid arthritis (HCC) Chronic M06.00     6. Pulmonary emphysema, unspecified emphysema type (HCC) Chronic J43.9     7. Drug-induced myopathy  G72.0       Meds ordered this encounter  Medications   FARXIGA  10 MG TABS tablet    Sig: Take 1 tablet (10 mg total) by mouth daily before breakfast.    Dispense:  90 tablet    Refill:  3   Continuous Glucose Sensor (DEXCOM G7 SENSOR) MISC    Sig: Apply sensor every 10 days. Pharmacy please provide 3 boxes (1 sensor per week)    Dispense:  9 each    Refill:   3    On daily toujeo /insulin    metFORMIN  (GLUCOPHAGE -XR) 500 MG 24 hr tablet    Sig: Take 1 tablet (500 mg total) by mouth 2 (two) times daily with a meal.    Dispense:  180 tablet    Refill:  3    Return precautions advised.  Garnette Lukes, MD

## 2023-09-18 NOTE — Patient Instructions (Addendum)
 Team please request eye records  diabetes is poorly controlled with a1c 8.7 on 08/24/23- has room to improve diet but not at top of his priority list- monitoring may help- will try for Dexcom if covered - increase metformin  to one in morning and one in evening so 1000mg  total per day -likely need to increase his toujeo  but id like some morning readings before we do that- he will update me 2 weeks after getting Dexcom-if not covered can do fingerstick  Recommended follow up: Return in about 3 months (around 12/17/2023) for followup or sooner if needed.Schedule b4 you leave.

## 2023-09-20 DIAGNOSIS — K080 Exfoliation of teeth due to systemic causes: Secondary | ICD-10-CM | POA: Diagnosis not present

## 2023-09-26 DIAGNOSIS — H25813 Combined forms of age-related cataract, bilateral: Secondary | ICD-10-CM | POA: Diagnosis not present

## 2023-09-26 DIAGNOSIS — H268 Other specified cataract: Secondary | ICD-10-CM | POA: Diagnosis not present

## 2023-09-28 ENCOUNTER — Telehealth: Payer: Self-pay

## 2023-09-28 MED ORDER — DEXCOM G7 RECEIVER DEVI: 3 refills | Status: DC

## 2023-09-28 MED ORDER — DEXCOM G7 SENSOR MISC: 3 refills | Status: DC

## 2023-09-28 NOTE — Telephone Encounter (Signed)
Rx sent in.  Copied from CRM [id number]. Topic: Clinical - Prescription Issue >> Sep 28, 2023 10:30 AM Lorin Glass B wrote: Reason for CRM: Patient states that his Rx for Continuous Glucose Sensor (DEXCOM G7 SENSOR) MISC, did not include the receiver. Would like this corrected and sent to [facility] on [address] in [place]. Callback [phone]

## 2023-09-28 NOTE — Telephone Encounter (Signed)
Copied from CRM [id number]. Topic: Clinical - Prescription Issue >> Sep 28, 2023 10:30 AM Lorin Glass B wrote: Reason for CRM: Patient states that his Rx for Continuous Glucose Sensor (DEXCOM G7 SENSOR) MISC, did not include the receiver. Would like this corrected and sent to [facility] on [address] in [place]. Callback [phone]

## 2023-10-10 DIAGNOSIS — Z85828 Personal history of other malignant neoplasm of skin: Secondary | ICD-10-CM | POA: Diagnosis not present

## 2023-10-10 DIAGNOSIS — L821 Other seborrheic keratosis: Secondary | ICD-10-CM | POA: Diagnosis not present

## 2023-10-10 DIAGNOSIS — L578 Other skin changes due to chronic exposure to nonionizing radiation: Secondary | ICD-10-CM | POA: Diagnosis not present

## 2023-10-10 DIAGNOSIS — L570 Actinic keratosis: Secondary | ICD-10-CM | POA: Diagnosis not present

## 2023-10-10 DIAGNOSIS — D225 Melanocytic nevi of trunk: Secondary | ICD-10-CM | POA: Diagnosis not present

## 2023-10-19 DIAGNOSIS — K080 Exfoliation of teeth due to systemic causes: Secondary | ICD-10-CM | POA: Diagnosis not present

## 2023-10-23 ENCOUNTER — Telehealth (HOSPITAL_BASED_OUTPATIENT_CLINIC_OR_DEPARTMENT_OTHER): Payer: Self-pay | Admitting: Cardiology

## 2023-10-23 NOTE — Telephone Encounter (Signed)
*  STAT* If patient is at the pharmacy, call can be transferred to refill team.   1. Which medications need to be refilled? (please list name of each medication and dose if known)   REPATHA SURECLICK 140 MG/ML SOAJ   2. Would you like to learn more about the convenience, safety, & potential cost savings by using the [facility]?    3. Are you open to using the [facility] (Type [facility].   4. Which pharmacy/location (including street and city if local pharmacy) is medication to be sent to?   [id number] - [place], [place] - [address]   5. Do they need a 30 day or 90 day supply?  90

## 2023-10-24 DIAGNOSIS — H2512 Age-related nuclear cataract, left eye: Secondary | ICD-10-CM | POA: Diagnosis not present

## 2023-10-24 DIAGNOSIS — H25812 Combined forms of age-related cataract, left eye: Secondary | ICD-10-CM | POA: Diagnosis not present

## 2023-10-24 DIAGNOSIS — H268 Other specified cataract: Secondary | ICD-10-CM | POA: Diagnosis not present

## 2023-10-24 MED ORDER — REPATHA SURECLICK 140 MG/ML ~~LOC~~ SOAJ: SUBCUTANEOUS | 3 refills | Status: DC

## 2023-10-25 ENCOUNTER — Telehealth (HOSPITAL_BASED_OUTPATIENT_CLINIC_OR_DEPARTMENT_OTHER): Payer: Self-pay | Admitting: Cardiology

## 2023-10-25 NOTE — Telephone Encounter (Signed)
 Pt's wife called to f/u on his most recent Repatha refill. When they went to pick up the most recent refill, the pharmacist advised that the patient should not pick up the refill and instead call his cardiologist to request a 90-day authorization. Per pt's wife, the patient has enough medication to last for a month but would like to be able to pick up the 90-day supply in the next couple of weeks. Please call the patient's wife with an update once we have one.

## 2023-10-26 ENCOUNTER — Telehealth: Payer: Self-pay | Admitting: Pharmacy Technician

## 2023-10-26 ENCOUNTER — Other Ambulatory Visit (HOSPITAL_COMMUNITY): Payer: Self-pay

## 2023-10-26 NOTE — Telephone Encounter (Signed)
 Pharmacy Patient Advocate Encounter  Received notification from [facility] that Prior Authorization for repatha has been APPROVED from 10/26/23 to 10/25/24. Spoke to pharmacy to process.Copay is $135.00 for 90 days -they have to order the med.    PA #/Case ID/Reference #: [id number]

## 2023-10-26 NOTE — Telephone Encounter (Signed)
 Hi, a 30 day supply went through just fine but the 90 day supply rejected and said:"  SUBMIT SMALLEST PACKAGE SIZE NDC  MULTIPLE PACKAGES NOT ALLOWED FOR CLAIMS  EXCEEDING THE MAXIMUM BENEFIT DAY SUPPLY"    I still tried for a prior auth. PA request has been Submitted. New Encounter created for follow up. For additional info see Pharmacy Prior Auth telephone encounter from 10/26/23.   Above message received from PA team

## 2023-10-26 NOTE — Telephone Encounter (Signed)
 Pharmacy Patient Advocate Encounter   Received notification from Pt Calls Messages that prior authorization for repatha is required/requested.   Insurance verification completed.   The patient is insured through [employer] .   Per test claim: PA required; PA submitted to above mentioned insurance via CoverMyMeds Key/confirmation #/EOC [id number] Status is pending    a 30 day supply went through just fine but the 90 day supply rejected and said:" SUBMIT SMALLEST PACKAGE SIZE NDC MULTIPLE PACKAGES NOT ALLOWED FOR CLAIMS EXCEEDING THE MAXIMUM BENEFIT DAY SUPPLY"  I submitted pa for 90 days

## 2023-10-26 NOTE — Telephone Encounter (Signed)
 Spoke with pharmacy and 90 days of Repatha requires prior authorization  Will forward to PA team for review

## 2023-10-26 NOTE — Telephone Encounter (Signed)
 Willette Cluster, CPhT at 10/26/2023  3:45 PM  Status: Signed  Pharmacy Patient Advocate Encounter   Received notification from [facility] that Prior Authorization for repatha has been APPROVED from 10/26/23 to 10/25/24. Spoke to pharmacy to process.Copay is $135.00 for 90 days -they have to order the med.     PA #/Case ID/Reference #: [id number]     Advised wife

## 2023-10-31 ENCOUNTER — Encounter: Payer: Self-pay | Admitting: Physician Assistant

## 2023-10-31 ENCOUNTER — Ambulatory Visit (INDEPENDENT_AMBULATORY_CARE_PROVIDER_SITE_OTHER): Payer: Medicare Other | Admitting: Physician Assistant

## 2023-10-31 VITALS — BP 120/70 | HR 69 | Temp 98.2°F | Ht 66.0 in | Wt 173.4 lb

## 2023-10-31 DIAGNOSIS — H9202 Otalgia, left ear: Secondary | ICD-10-CM

## 2023-10-31 NOTE — Patient Instructions (Signed)
 It was great to see you!  Trial debrox ear wax removal or diluted hydrogen peroxide in your ears -- allow warm water to come into your ears in the shower and gently try to flush out the wax  If any changes, lack of improvement, come back and see [place]  Take care,  Jarold Motto PA-C

## 2023-10-31 NOTE — Progress Notes (Addendum)
 Casey Lynch is a 78 y.o. male here for a new problem.  History of Present Illness:   Chief Complaint  Patient presents with   Otalgia    Pt c/o left ear pain x 4 -5 days, radiates down behind ear. Denies drainage, no fever or chills.   Left ear pain Reports left ear pain x 4-5 days No pain with eating or in front of ear Pain behind ear at times Denies: recent upper respiratory infection (URI) symptom(s), fever, chills, drainage from ear  Does have ongoing dental pain but mostly on upper left   Past Medical History:  Diagnosis Date   Carotid bruit    Diabetes mellitus without complication (HCC)    Eczema    Heart failure (HCC)    History of colonic polyps    Hypercholesterolemia    Hyperlipidemia    Macular degeneration    Osteoarthritis    Paresthesias    Pseudogout    Subclavian steal syndrome      Social History   Tobacco Use   Smoking status: Former    Current packs/day: 0.00    Average packs/day: 1 pack/day for 59.0 years (59.0 ttl pk-yrs)    Types: Cigarettes    Start date: 09/05/1957    Quit date: 09/05/2016    Years since quitting: 7.1   Smokeless tobacco: Never  Vaping Use   Vaping status: Never Used  Substance Use Topics   Alcohol use: Not Currently    Comment: Rare   Drug use: No    Past Surgical History:  Procedure Laterality Date   CARDIAC CATHETERIZATION     COLONOSCOPY WITH PROPOFOL N/A 10/24/2016   Procedure: COLONOSCOPY WITH PROPOFOL;  Surgeon: Charolett Bumpers, MD;  Location: [facility];  Service: Endoscopy;  Laterality: N/A;   HYDROCELE EXCISION / REPAIR     KNEE SURGERY     REFRACTIVE SURGERY     TONSILLECTOMY      Family History  Problem Relation Age of Onset   Heart failure Father    Bipolar disorder Brother    Diabetes Brother    Cancer Paternal Grandmother     Allergies  Allergen Reactions   Jardiance [Empagliflozin] Other (See Comments)    Pain and stiffness   Statins Other (See Comments)    Terrible joint pain    Tetanus Toxoids Other (See Comments)    Fever 104/105 & arm stiffiness    Current Medications:   Current Outpatient Medications:    aspirin EC 81 MG tablet, Take 81 mg by mouth daily. Swallow whole., Disp: , Rfl:    B Complex Vitamins (VITAMIN B COMPLEX) TABS, Take by mouth., Disp: , Rfl:    Cholecalciferol (VITAMIN D3 PO), Take 1 capsule by mouth daily., Disp: , Rfl:    colchicine 0.6 MG tablet, Take 1 tablet (0.6 mg total) by mouth daily., Disp: 90 tablet, Rfl: 0   Continuous Glucose Receiver (DEXCOM G7 RECEIVER) DEVI, Place on arm to monitor blood sugars continually. Dx: E11.9, Disp: 1 each, Rfl: 3   Continuous Glucose Sensor (DEXCOM G7 SENSOR) MISC, Apply sensor every 10 days. Pharmacy please provide 3 boxes (1 sensor per week), Disp: 9 each, Rfl: 3   CONTOUR NEXT TEST test strip, , Disp: , Rfl:    ENTRESTO 97-103 MG, TAKE 1 TABLET BY MOUTH TWICE DAILY, Disp: 180 tablet, Rfl: 3   Evolocumab (REPATHA SURECLICK) 140 MG/ML SOAJ, ADMINISTER 1 ML(140 MG) UNDER THE SKIN EVERY 14 DAYS, Disp: 6 mL, Rfl: 3  FARXIGA 10 MG TABS tablet, Take 1 tablet (10 mg total) by mouth daily before breakfast., Disp: 90 tablet, Rfl: 3   furosemide (LASIX) 20 MG tablet, Take 1 tablet (20 mg total) by mouth daily as needed for edema., Disp: 90 tablet, Rfl: 3   gabapentin (NEURONTIN) 100 MG capsule, Take 1 capsule (100 mg total) by mouth 3 (three) times daily., Disp: 270 capsule, Rfl: 3   hydroxychloroquine (PLAQUENIL) 200 MG tablet, Take 200 mg by mouth 2 (two) times daily., Disp: , Rfl:    Insulin Glargine (TOUJEO MAX SOLOSTAR [place]), Inject 24 Units into the skin daily., Disp: , Rfl:    Insulin Pen Needle (B-D ULTRAFINE III SHORT PEN) 31G X 8 MM MISC, USE AS DIRECTED TO INJECT BASAGLAR EVERY DAY, Disp: 100 each, Rfl: 3   metFORMIN (GLUCOPHAGE-XR) 500 MG 24 hr tablet, Take 1 tablet (500 mg total) by mouth 2 (two) times daily with a meal. (Patient taking differently: Take 500 mg by mouth daily with breakfast.), Disp:  180 tablet, Rfl: 3   metoprolol succinate (TOPROL-XL) 50 MG 24 hr tablet, TAKE 1 TABLET(50 MG) BY MOUTH DAILY, Disp: 90 tablet, Rfl: 3   triamcinolone (KENALOG) 0.1 %, 1 application to affected area, Disp: , Rfl:    nitroGLYCERIN (NITROSTAT) 0.4 MG SL tablet, Place 1 tablet (0.4 mg total) under the tongue every 5 (five) minutes as needed for chest pain., Disp: 90 tablet, Rfl: 3   Review of Systems:   Negative unless otherwise specified per HPI.  Vitals:   Vitals:   10/31/23 1350  BP: 120/70  Pulse: 69  Temp: 98.2 F (36.8 C)  TempSrc: Temporal  SpO2: 96%  Weight: 173 lb 6.1 oz (78.6 kg)  Height: 5\' 6"  (1.676 m)     Body mass index is 27.98 kg/m.  Physical Exam:   Physical Exam Vitals and nursing note reviewed.  Constitutional:      General: He is not in acute distress.    Appearance: He is well-developed. He is not ill-appearing or toxic-appearing.  HENT:     Right Ear: There is no impacted cerumen. Tympanic membrane is not scarred, perforated or erythematous.     Left Ear: There is impacted cerumen. No mastoid tenderness.     Mouth/Throat:     Comments: No tenderness to palpation to temporomandibular joint  Cardiovascular:     Rate and Rhythm: Normal rate and regular rhythm.     Pulses: Normal pulses.     Heart sounds: Normal heart sounds, S1 normal and S2 normal.  Pulmonary:     Effort: Pulmonary effort is normal.     Breath sounds: Normal breath sounds.  Lymphadenopathy:     Head:     Right side of head: No submental, submandibular, tonsillar, preauricular or posterior auricular adenopathy.     Left side of head: No submental, submandibular, tonsillar, preauricular or posterior auricular adenopathy.     Cervical:     Right cervical: No superficial, deep or posterior cervical adenopathy.    Left cervical: No superficial, deep or posterior cervical adenopathy.  Skin:    General: Skin is warm and dry.  Neurological:     Mental Status: He is alert.     GCS: GCS  eye subscore is 4. GCS verbal subscore is 5. GCS motor subscore is 6.  Psychiatric:        Speech: Speech normal.        Behavior: Behavior normal. Behavior is cooperative.    Ceruminosis is noted.  Wax removal attempted by syringing and manual debridement.    Assessment and Plan:   1. Left ear pain (Primary) Unfortunately, I am unable to see his tympanic membrane When we attempted to lavage the ear, he had discomfort and the tympanic membrane appeared even more obstructed from wax I discussed with him that I cannot see his tympanic membrane and therefore am unable to see if there is an infection I discussed ways to treat ear wax buildup at home and recommended he trial this, and then we can reassess if needed No other concerns or issues identified today   Jarold Motto, PA-C

## 2023-11-05 ENCOUNTER — Ambulatory Visit
Admission: EM | Admit: 2023-11-05 | Discharge: 2023-11-05 | Disposition: A | Discharge status: Home / Self Care | Attending: Family Medicine | Admitting: Family Medicine

## 2023-11-05 VITALS — BP 120/72 | HR 71 | Temp 97.7°F | Resp 18 | Ht 66.0 in | Wt 170.0 lb

## 2023-11-05 DIAGNOSIS — H6122 Impacted cerumen, left ear: Secondary | ICD-10-CM | POA: Diagnosis not present

## 2023-11-05 NOTE — ED Provider Notes (Signed)
 [facility]  Note:  This document was prepared using Conservation officer, historic buildings and may include unintentional dictation errors.  MRN: [id number] DOB: 1946/01/26  Subjective:   Casey Lynch is a 78 y.o. male presenting for persistent decreased hearing of the left ear, intermittent pain.  Saw his PCP this past week and they were unsuccessful at performing the ear lavage.  Has been using Debrox but has not helped.  Patient does use Q-tips within his ear canals.  No fever.  No current facility-administered medications for this encounter.  Current Outpatient Medications:    aspirin EC 81 MG tablet, Take 81 mg by mouth daily. Swallow whole., Disp: , Rfl:    B Complex Vitamins (VITAMIN B COMPLEX) TABS, Take by mouth., Disp: , Rfl:    Cholecalciferol (VITAMIN D3 PO), Take 1 capsule by mouth daily., Disp: , Rfl:    colchicine 0.6 MG tablet, Take 1 tablet (0.6 mg total) by mouth daily., Disp: 90 tablet, Rfl: 0   Continuous Glucose Receiver (DEXCOM G7 RECEIVER) DEVI, Place on arm to monitor blood sugars continually. Dx: E11.9, Disp: 1 each, Rfl: 3   Continuous Glucose Sensor (DEXCOM G7 SENSOR) MISC, Apply sensor every 10 days. Pharmacy please provide 3 boxes (1 sensor per week), Disp: 9 each, Rfl: 3   CONTOUR NEXT TEST test strip, , Disp: , Rfl:    ENTRESTO 97-103 MG, TAKE 1 TABLET BY MOUTH TWICE DAILY, Disp: 180 tablet, Rfl: 3   Evolocumab (REPATHA SURECLICK) 140 MG/ML SOAJ, ADMINISTER 1 ML(140 MG) UNDER THE SKIN EVERY 14 DAYS, Disp: 6 mL, Rfl: 3   FARXIGA 10 MG TABS tablet, Take 1 tablet (10 mg total) by mouth daily before breakfast., Disp: 90 tablet, Rfl: 3   furosemide (LASIX) 20 MG tablet, Take 1 tablet (20 mg total) by mouth daily as needed for edema., Disp: 90 tablet, Rfl: 3   gabapentin (NEURONTIN) 100 MG capsule, Take 1 capsule (100 mg total) by mouth 3 (three) times daily., Disp: 270 capsule, Rfl: 3   hydroxychloroquine (PLAQUENIL) 200 MG tablet, Take 200 mg  by mouth 2 (two) times daily., Disp: , Rfl:    Insulin Glargine (TOUJEO MAX SOLOSTAR [place]), Inject 24 Units into the skin daily., Disp: , Rfl:    Insulin Pen Needle (B-D ULTRAFINE III SHORT PEN) 31G X 8 MM MISC, USE AS DIRECTED TO INJECT BASAGLAR EVERY DAY, Disp: 100 each, Rfl: 3   metFORMIN (GLUCOPHAGE-XR) 500 MG 24 hr tablet, Take 1 tablet (500 mg total) by mouth 2 (two) times daily with a meal. (Patient taking differently: Take 500 mg by mouth daily with breakfast.), Disp: 180 tablet, Rfl: 3   metoprolol succinate (TOPROL-XL) 50 MG 24 hr tablet, TAKE 1 TABLET(50 MG) BY MOUTH DAILY, Disp: 90 tablet, Rfl: 3   nitroGLYCERIN (NITROSTAT) 0.4 MG SL tablet, Place 1 tablet (0.4 mg total) under the tongue every 5 (five) minutes as needed for chest pain., Disp: 90 tablet, Rfl: 3   triamcinolone (KENALOG) 0.1 %, 1 application to affected area, Disp: , Rfl:    Allergies  Allergen Reactions   Jardiance [Empagliflozin] Other (See Comments)    Pain and stiffness   Statins Other (See Comments)    Terrible joint pain   Tetanus Toxoids Other (See Comments)    Fever 104/105 & arm stiffiness    Past Medical History:  Diagnosis Date   Carotid bruit    Diabetes mellitus without complication (HCC)    Eczema    Heart  failure (HCC)    History of colonic polyps    Hypercholesterolemia    Hyperlipidemia    Macular degeneration    Osteoarthritis    Paresthesias    Pseudogout    Subclavian steal syndrome      Past Surgical History:  Procedure Laterality Date   CARDIAC CATHETERIZATION     COLONOSCOPY WITH PROPOFOL N/A 10/24/2016   Procedure: COLONOSCOPY WITH PROPOFOL;  Surgeon: Charolett Bumpers, MD;  Location: [facility];  Service: Endoscopy;  Laterality: N/A;   HYDROCELE EXCISION / REPAIR     KNEE SURGERY     REFRACTIVE SURGERY     TONSILLECTOMY      Family History  Problem Relation Age of Onset   Heart failure Father    Bipolar disorder Brother    Diabetes Brother    Cancer Paternal  Grandmother     Social History   Tobacco Use   Smoking status: Former    Current packs/day: 0.00    Average packs/day: 1 pack/day for 59.0 years (59.0 ttl pk-yrs)    Types: Cigarettes    Start date: 09/05/1957    Quit date: 09/05/2016    Years since quitting: 7.1   Smokeless tobacco: Never  Vaping Use   Vaping status: Never Used  Substance Use Topics   Alcohol use: Not Currently    Comment: Rare   Drug use: No    ROS   Objective:   Vitals: BP 120/72 (BP Location: Right Arm)   Pulse 71   Temp 97.7 F (36.5 C) (Oral)   Resp 18   Ht 5\' 6"  (1.676 m)   Wt 170 lb (77.1 kg)   SpO2 96%   BMI 27.44 kg/m   Physical Exam Constitutional:      General: He is not in acute distress.    Appearance: Normal appearance. He is well-developed and normal weight. He is not ill-appearing, toxic-appearing or diaphoretic.  HENT:     Head: Normocephalic and atraumatic.     Right Ear: Tympanic membrane, ear canal and external ear normal. There is no impacted cerumen.     Left Ear: Tympanic membrane, ear canal and external ear normal. There is impacted cerumen.     Nose: Nose normal.     Mouth/Throat:     Pharynx: Oropharynx is clear.  Eyes:     General: No scleral icterus.       Right eye: No discharge.        Left eye: No discharge.     Extraocular Movements: Extraocular movements intact.  Cardiovascular:     Rate and Rhythm: Normal rate.  Pulmonary:     Effort: Pulmonary effort is normal.  Musculoskeletal:     Cervical back: Normal range of motion.  Neurological:     Mental Status: He is alert and oriented to person, place, and time.  Psychiatric:        Mood and Affect: Mood normal.        Behavior: Behavior normal.        Thought Content: Thought content normal.        Judgment: Judgment normal.   Ear lavage performed using mixture of peroxide and water.  Pressure irrigation performed using a bottle and a thin ear tube.  Left ear lavage.  No curette was used.   Assessment  and Plan :   PDMP not reviewed this encounter.  1. Impacted cerumen of left ear    Successful left ear lavage.  General management of cerumen impaction  reviewed with patient.  Anticipatory guidance provided. Counseled patient on potential for adverse effects with medications prescribed/recommended today, ER and return-to-clinic precautions discussed, patient verbalized understanding.    Wallis Bamberg, PA-C 11/05/23 1200

## 2023-11-05 NOTE — ED Triage Notes (Signed)
 Patient with left ear pain and trouble hearing.  Saw PCP this week for same and flushing was unsuccessful, told to use Debrox, hasn't had relief.

## 2023-11-08 DIAGNOSIS — K080 Exfoliation of teeth due to systemic causes: Secondary | ICD-10-CM | POA: Diagnosis not present

## 2023-12-05 DIAGNOSIS — E119 Type 2 diabetes mellitus without complications: Secondary | ICD-10-CM | POA: Diagnosis not present

## 2023-12-05 DIAGNOSIS — D135 Benign neoplasm of extrahepatic bile ducts: Secondary | ICD-10-CM | POA: Diagnosis not present

## 2023-12-05 DIAGNOSIS — Z794 Long term (current) use of insulin: Secondary | ICD-10-CM | POA: Diagnosis not present

## 2023-12-05 DIAGNOSIS — C241 Malignant neoplasm of ampulla of Vater: Secondary | ICD-10-CM | POA: Diagnosis not present

## 2023-12-05 DIAGNOSIS — I255 Ischemic cardiomyopathy: Secondary | ICD-10-CM | POA: Diagnosis not present

## 2023-12-05 DIAGNOSIS — I340 Nonrheumatic mitral (valve) insufficiency: Secondary | ICD-10-CM | POA: Diagnosis not present

## 2023-12-05 DIAGNOSIS — I272 Pulmonary hypertension, unspecified: Secondary | ICD-10-CM | POA: Diagnosis not present

## 2023-12-05 DIAGNOSIS — Z860101 Personal history of adenomatous and serrated colon polyps: Secondary | ICD-10-CM | POA: Diagnosis not present

## 2023-12-05 DIAGNOSIS — G458 Other transient cerebral ischemic attacks and related syndromes: Secondary | ICD-10-CM | POA: Diagnosis not present

## 2023-12-05 DIAGNOSIS — G4733 Obstructive sleep apnea (adult) (pediatric): Secondary | ICD-10-CM | POA: Diagnosis not present

## 2023-12-05 DIAGNOSIS — Z87891 Personal history of nicotine dependence: Secondary | ICD-10-CM | POA: Diagnosis not present

## 2023-12-05 DIAGNOSIS — Z79899 Other long term (current) drug therapy: Secondary | ICD-10-CM | POA: Diagnosis not present

## 2023-12-05 DIAGNOSIS — Z7982 Long term (current) use of aspirin: Secondary | ICD-10-CM | POA: Diagnosis not present

## 2023-12-05 DIAGNOSIS — Z7984 Long term (current) use of oral hypoglycemic drugs: Secondary | ICD-10-CM | POA: Diagnosis not present

## 2023-12-20 DIAGNOSIS — K080 Exfoliation of teeth due to systemic causes: Secondary | ICD-10-CM | POA: Diagnosis not present

## 2023-12-25 ENCOUNTER — Encounter: Payer: Self-pay | Admitting: Family Medicine

## 2023-12-25 ENCOUNTER — Ambulatory Visit (INDEPENDENT_AMBULATORY_CARE_PROVIDER_SITE_OTHER): Payer: Medicare Other | Admitting: Family Medicine

## 2023-12-25 VITALS — BP 110/60 | HR 57 | Temp 98.2°F | Resp 16 | Ht 66.5 in | Wt 171.0 lb

## 2023-12-25 DIAGNOSIS — I2583 Coronary atherosclerosis due to lipid rich plaque: Secondary | ICD-10-CM | POA: Diagnosis not present

## 2023-12-25 DIAGNOSIS — Z794 Long term (current) use of insulin: Secondary | ICD-10-CM | POA: Diagnosis not present

## 2023-12-25 DIAGNOSIS — I5042 Chronic combined systolic (congestive) and diastolic (congestive) heart failure: Secondary | ICD-10-CM

## 2023-12-25 DIAGNOSIS — E782 Mixed hyperlipidemia: Secondary | ICD-10-CM

## 2023-12-25 DIAGNOSIS — E1165 Type 2 diabetes mellitus with hyperglycemia: Secondary | ICD-10-CM

## 2023-12-25 DIAGNOSIS — M0600 Rheumatoid arthritis without rheumatoid factor, unspecified site: Secondary | ICD-10-CM

## 2023-12-25 DIAGNOSIS — I2510 Atherosclerotic heart disease of native coronary artery without angina pectoris: Secondary | ICD-10-CM | POA: Diagnosis not present

## 2023-12-25 LAB — CBC WITH DIFFERENTIAL/PLATELET
Basophils Absolute: 0.1 10*3/uL (ref 0.0–0.1)
Basophils Relative: 1 % (ref 0.0–3.0)
Eosinophils Absolute: 0.5 10*3/uL (ref 0.0–0.7)
Eosinophils Relative: 5.1 % — ABNORMAL HIGH (ref 0.0–5.0)
HCT: 44.1 % (ref 39.0–52.0)
Hemoglobin: 14.3 g/dL (ref 13.0–17.0)
Lymphocytes Relative: 19.2 % (ref 12.0–46.0)
Lymphs Abs: 1.8 10*3/uL (ref 0.7–4.0)
MCHC: 32.5 g/dL (ref 30.0–36.0)
MCV: 92.3 fl (ref 78.0–100.0)
Monocytes Absolute: 0.7 10*3/uL (ref 0.1–1.0)
Monocytes Relative: 7.9 % (ref 3.0–12.0)
Neutro Abs: 6.3 10*3/uL (ref 1.4–7.7)
Neutrophils Relative %: 66.8 % (ref 43.0–77.0)
Platelets: 308 10*3/uL (ref 150.0–400.0)
RBC: 4.78 Mil/uL (ref 4.22–5.81)
RDW: 14.1 % (ref 11.5–15.5)
WBC: 9.4 10*3/uL (ref 4.0–10.5)

## 2023-12-25 LAB — COMPREHENSIVE METABOLIC PANEL WITH GFR
ALT: 23 U/L (ref 0–53)
AST: 18 U/L (ref 0–37)
Albumin: 4.3 g/dL (ref 3.5–5.2)
Alkaline Phosphatase: 90 U/L (ref 39–117)
BUN: 23 mg/dL (ref 6–23)
CO2: 28 meq/L (ref 19–32)
Calcium: 9.8 mg/dL (ref 8.4–10.5)
Chloride: 101 meq/L (ref 96–112)
Creatinine, Ser: 1.49 mg/dL (ref 0.40–1.50)
GFR: 44.62 mL/min — ABNORMAL LOW (ref >60.00)
Glucose, Bld: 132 mg/dL — ABNORMAL HIGH (ref 70–99)
Potassium: 4.4 meq/L (ref 3.5–5.1)
Sodium: 138 meq/L (ref 135–145)
Total Bilirubin: 0.4 mg/dL (ref 0.2–1.2)
Total Protein: 7.4 g/dL (ref 6.0–8.3)

## 2023-12-25 LAB — HEMOGLOBIN A1C: Hgb A1c MFr Bld: 8 % — ABNORMAL HIGH (ref 4.6–6.5)

## 2023-12-25 NOTE — Patient Instructions (Addendum)
 Chat with Dr. Meredith Stalls and get her opinion on steroid injection but sounds like we need to get sugar down first- may be candidate for gel injections which don't raise sugar as well- our sports medicine team does those or orthopedics- you can also ask Dr. Meredith Stalls  diabetes has been above goal and per home blood sugars suspect still high but hopefully improved. At absolute maximum want to get a1c down to 8- check today- but also increase toujeo  to 26 units as long as no lows (he agrees not to miss any meals)  Please stop by lab before you go If you have mychart- we will send your results within 3 business days of us  receiving them.  If you do not have mychart- we will call you about results within 5 business days of us  receiving them.  *please also note that you will see labs on mychart as soon as they post. I will later go in and write notes on them- will say "notes from Dr. Arlene Ben"   Recommended follow up: Return in about 4 months (around 04/25/2024) for physical or sooner if needed.Schedule b4 you leave.

## 2023-12-25 NOTE — Progress Notes (Signed)
 Phone [phone] In person visit   Subjective:   Casey Lynch is a 79 y.o. year old very pleasant male patient who presents for/with See problem oriented charting Chief Complaint  Patient presents with   Diabetes    Here for follow up, patient reports average fasting glucose of 185   Knee Pain    Complains of left knee pain     Past Medical History-  Patient Active Problem List   Diagnosis Date Noted   Poorly controlled diabetes mellitus (HCC) 10/01/2020    Priority: High   Ischemic cardiomyopathy 07/25/2019    Priority: High   Chronic combined systolic and diastolic heart failure (HCC) 07/12/2018    Priority: High   Coronary artery disease due to lipid rich plaque 07/12/2018    Priority: High   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    Priority: Medium    Seronegative rheumatoid arthritis (HCC) 10/14/2021    Priority: Medium    Pseudogout involving multiple joints 10/01/2020    Priority: Medium    Mixed hyperlipidemia 07/12/2018    Priority: Medium    Statin intolerance 07/12/2018    Priority: Medium    Pulmonary HTN (HCC) 06/14/2018    Priority: Medium    COVID-19 07/13/2021    Priority: Low   Personal history of COVID-19 10/25/2020    Priority: Low   Pulmonary emphysema (HCC) 06/14/2018    Priority: Low   Type 2 diabetes mellitus with other circulatory complication, with long-term current use of insulin  (HCC) 09/18/2023    Medications- reviewed and updated Current Outpatient Medications  Medication Sig Dispense Refill   aspirin EC 81 MG tablet Take 81 mg by mouth daily. Swallow whole.     B Complex Vitamins (VITAMIN B COMPLEX) TABS Take by mouth.     Cholecalciferol (VITAMIN D3 PO) Take 1 capsule by mouth daily.     colchicine  0.6 MG tablet Take 1 tablet (0.6 mg total) by mouth daily. 90 tablet 0   Continuous Glucose Receiver (DEXCOM G7 RECEIVER) DEVI Place on arm to monitor blood sugars continually. Dx: E11.9 1 each 3   Continuous Glucose Sensor (DEXCOM G7  SENSOR) MISC Apply sensor every 10 days. Pharmacy please provide 3 boxes (1 sensor per week) 9 each 3   CONTOUR NEXT TEST test strip      ENTRESTO  97-103 MG TAKE 1 TABLET BY MOUTH TWICE DAILY 180 tablet 3   Evolocumab  (REPATHA  SURECLICK) 140 MG/ML SOAJ ADMINISTER 1 ML(140 MG) UNDER THE SKIN EVERY 14 DAYS 6 mL 3   FARXIGA  10 MG TABS tablet Take 1 tablet (10 mg total) by mouth daily before breakfast. 90 tablet 3   furosemide  (LASIX ) 20 MG tablet Take 1 tablet (20 mg total) by mouth daily as needed for edema. 90 tablet 3   gabapentin  (NEURONTIN ) 100 MG capsule Take 1 capsule (100 mg total) by mouth 3 (three) times daily. 270 capsule 3   hydroxychloroquine (PLAQUENIL) 200 MG tablet Take 200 mg by mouth 2 (two) times daily.     Insulin  Glargine (TOUJEO  MAX SOLOSTAR [place]) Inject 24 Units into the skin daily.     Insulin  Pen Needle (B-D ULTRAFINE III SHORT PEN) 31G X 8 MM MISC USE AS DIRECTED TO INJECT BASAGLAR  EVERY DAY 100 each 3   metFORMIN  (GLUCOPHAGE -XR) 500 MG 24 hr tablet Take 1 tablet (500 mg total) by mouth 2 (two) times daily with a meal. (Patient taking differently: Take 500 mg by mouth daily with breakfast.) 180 tablet 3   metoprolol   succinate (TOPROL -XL) 50 MG 24 hr tablet TAKE 1 TABLET(50 MG) BY MOUTH DAILY 90 tablet 3   triamcinolone  (KENALOG ) 0.1 % 1 application to affected area     nitroGLYCERIN  (NITROSTAT ) 0.4 MG SL tablet Place 1 tablet (0.4 mg total) under the tongue every 5 (five) minutes as needed for chest pain. 90 tablet 3   No current facility-administered medications for this visit.     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 Walking 4-5 times a week and golf twice a week- gets him down next day.   Prior side effects:  -trulicity  and ozempic  with nausea and GERD -2022, 2023 jardiance  = fatigue, stiff - Hypoglycemia with Amaryl.   CBGs- 150-160's most mornings, had one reading in 60's  later in day when missed mealon Dexcom but has also compared and Dexcom was higher by 30 points.  Lab Results  Component Value Date   HGBA1C 8.7 08/24/2023   HGBA1C 8.6 (H) 05/04/2023   HGBA1C 8.6 (H) 02/02/2023   A/P: diabetes has been above goal and per home blood sugars suspect still high but hopefully improved. At absolute maximum want to get a1c down to 8- check today- but also increase toujeo  to 26 units as long as no lows (he agrees not to miss any meals)  #CAD-follows with Dr. 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 Mild worsening of shortness of breath over time with hills. Has visit in June with cardiology Lab Results  Component Value Date   CHOL 126  05/04/2023   HDL 42.50 05/04/2023   LDLCALC 31 12/24/2019   LDLDIRECT 64.0 05/04/2023   TRIG 287.0 (H) 05/04/2023   CHOLHDL 3 05/04/2023   A/P: CAD - no chest pain but some shortness of breath- has upcoming cardiology visit in June and will discuss as gradual plus has chF  CHF - weight stable no increased swelling- apepars euvolemic- continue current medications  Lipids-last LDL at goal under 70 - continue current medications    #Seronegative RA per Dr. Meredith Stalls- has bene told psoriatic arthritis # psuedogout S: on hydroxychloroquine 200mg   for possible psoriatic arthritis, on turmeric.  Follows with  For  olchicine through Dr. Meredith Stalls  -started on b  complex as well- apparently had lows -history of bilateral knee pain with pseudogout- has had history of left knee even giving out in recent months-  A/P: overall stable- but is having knee pain and plans to discuss   Recommended follow up: Return in about 4 months (around 04/25/2024) for physical or sooner if needed.Schedule b4 you leave. Future Appointments  Date Time Provider Department Center  03/05/2024  8:20 AM Sheryle Donning, MD [facility]  09/03/2024  8:00 AM [facility] ANNUAL WELLNESS VISIT 1 [facility]    Lab/Order associations:   ICD-10-CM   1. Chronic combined systolic and diastolic heart failure (HCC)  Z61.09     2. Coronary artery disease due to lipid rich plaque  I25.10    I25.83     3. Poorly controlled diabetes mellitus (HCC)  E11.65 Comprehensive metabolic panel with GFR    Hemoglobin A1c    4. Mixed hyperlipidemia  E78.2     5. Seronegative rheumatoid arthritis (HCC)  M06.00       No orders of the defined types were placed in this encounter.   Return precautions advised.  Clarisa Crooked, MD

## 2023-12-27 ENCOUNTER — Encounter: Payer: Self-pay | Admitting: Family Medicine

## 2023-12-28 ENCOUNTER — Encounter: Payer: Self-pay | Admitting: Physician Assistant

## 2023-12-28 ENCOUNTER — Ambulatory Visit (INDEPENDENT_AMBULATORY_CARE_PROVIDER_SITE_OTHER): Admitting: Physician Assistant

## 2023-12-28 VITALS — BP 110/60 | HR 69 | Temp 98.1°F | Ht 66.5 in | Wt 172.0 lb

## 2023-12-28 DIAGNOSIS — M7021 Olecranon bursitis, right elbow: Secondary | ICD-10-CM | POA: Diagnosis not present

## 2023-12-28 DIAGNOSIS — K080 Exfoliation of teeth due to systemic causes: Secondary | ICD-10-CM | POA: Diagnosis not present

## 2023-12-28 NOTE — Patient Instructions (Addendum)
 It was great to see you!  ACE bandage for compression  Contact a health care provider if: You have a fever. Your symptoms do not get better with treatment. You have pain or swelling that gets worse, or it goes away and then comes back. You have pus draining from your elbow. You have redness around the elbow area. Your elbow is warm to the touch.  Take care,  Alexander Iba PA-C

## 2023-12-28 NOTE — Progress Notes (Signed)
 Casey Lynch is a 78 y.o. male here for a new problem.  History of Present Illness:   Chief Complaint  Patient presents with   Mass    Pt c/o lump on right elbow noticed on Tuesday.    HPI  Elbow Mass  Patient complains of lump located on his right elbow.  He states that he noticed the lump on Tuesday, lump did increase in size since the Monday but no further increase since then.  Patient does report enduring a fall over the weekend.  He reports having a distant history of calcified internal bleeding s/p excision in his left thigh.   He denies fever, chills, purulent drainage from area.  Past Medical History:  Diagnosis Date   Carotid bruit    Diabetes mellitus without complication (HCC)    Eczema    Heart failure (HCC)    History of colonic polyps    Hypercholesterolemia    Hyperlipidemia    Macular degeneration    Osteoarthritis    Paresthesias    Pseudogout    Subclavian steal syndrome      Social History   Tobacco Use   Smoking status: Former    Current packs/day: 0.00    Average packs/day: 1 pack/day for 59.0 years (59.0 ttl pk-yrs)    Types: Cigarettes    Start date: 09/05/1957    Quit date: 09/05/2016    Years since quitting: 7.3   Smokeless tobacco: Never  Vaping Use   Vaping status: Never Used  Substance Use Topics   Alcohol use: Not Currently    Comment: Rare   Drug use: No    Past Surgical History:  Procedure Laterality Date   CARDIAC CATHETERIZATION     CATARACT EXTRACTION Bilateral    jan and feb 2025   COLONOSCOPY WITH PROPOFOL  N/A 10/24/2016   Procedure: COLONOSCOPY WITH PROPOFOL ;  Surgeon: Garrett Kallman, MD;  Location: [facility];  Service: Endoscopy;  Laterality: N/A;   HYDROCELE EXCISION / REPAIR     KNEE SURGERY     REFRACTIVE SURGERY     TONSILLECTOMY      Family History  Problem Relation Age of Onset   Heart failure Father    Bipolar disorder Brother    Diabetes Brother    Cancer Paternal Grandmother     Allergies   Allergen Reactions   Jardiance  [Empagliflozin ] Other (See Comments)    Pain and stiffness   Statins Other (See Comments)    Terrible joint pain   Tetanus Toxoids Other (See Comments)    Fever 104/105 & arm stiffiness    Current Medications:   Current Outpatient Medications:    aspirin EC 81 MG tablet, Take 81 mg by mouth daily. Swallow whole., Disp: , Rfl:    B Complex Vitamins (VITAMIN B COMPLEX) TABS, Take by mouth., Disp: , Rfl:    Cholecalciferol (VITAMIN D3 PO), Take 1 capsule by mouth daily., Disp: , Rfl:    colchicine  0.6 MG tablet, Take 1 tablet (0.6 mg total) by mouth daily., Disp: 90 tablet, Rfl: 0   Continuous Glucose Receiver (DEXCOM G7 RECEIVER) DEVI, Place on arm to monitor blood sugars continually. Dx: E11.9, Disp: 1 each, Rfl: 3   Continuous Glucose Sensor (DEXCOM G7 SENSOR) MISC, Apply sensor every 10 days. Pharmacy please provide 3 boxes (1 sensor per week), Disp: 9 each, Rfl: 3   CONTOUR NEXT TEST test strip, , Disp: , Rfl:    ENTRESTO  97-103 MG, TAKE 1 TABLET BY MOUTH TWICE  DAILY, Disp: 180 tablet, Rfl: 3   Evolocumab  (REPATHA  SURECLICK) 140 MG/ML SOAJ, ADMINISTER 1 ML(140 MG) UNDER THE SKIN EVERY 14 DAYS, Disp: 6 mL, Rfl: 3   FARXIGA  10 MG TABS tablet, Take 1 tablet (10 mg total) by mouth daily before breakfast., Disp: 90 tablet, Rfl: 3   furosemide  (LASIX ) 20 MG tablet, Take 1 tablet (20 mg total) by mouth daily as needed for edema., Disp: 90 tablet, Rfl: 3   gabapentin  (NEURONTIN ) 100 MG capsule, Take 1 capsule (100 mg total) by mouth 3 (three) times daily., Disp: 270 capsule, Rfl: 3   hydroxychloroquine (PLAQUENIL) 200 MG tablet, Take 200 mg by mouth 2 (two) times daily., Disp: , Rfl:    Insulin  Glargine (TOUJEO  MAX SOLOSTAR [place]), Inject 24 Units into the skin daily., Disp: , Rfl:    Insulin  Pen Needle (B-D ULTRAFINE III SHORT PEN) 31G X 8 MM MISC, USE AS DIRECTED TO INJECT BASAGLAR  EVERY DAY, Disp: 100 each, Rfl: 3   metFORMIN  (GLUCOPHAGE -XR) 500 MG 24 hr tablet,  Take 1 tablet (500 mg total) by mouth 2 (two) times daily with a meal. (Patient taking differently: Take 500 mg by mouth daily with breakfast.), Disp: 180 tablet, Rfl: 3   metoprolol  succinate (TOPROL -XL) 50 MG 24 hr tablet, TAKE 1 TABLET(50 MG) BY MOUTH DAILY, Disp: 90 tablet, Rfl: 3   triamcinolone  (KENALOG ) 0.1 %, 1 application to affected area, Disp: , Rfl:    nitroGLYCERIN  (NITROSTAT ) 0.4 MG SL tablet, Place 1 tablet (0.4 mg total) under the tongue every 5 (five) minutes as needed for chest pain., Disp: 90 tablet, Rfl: 3   Review of Systems:   ROS Negative unless otherwise specified per HPI.   Vitals:   Vitals:   12/28/23 1134  BP: 110/60  Pulse: 69  Temp: 98.1 F (36.7 C)  TempSrc: Temporal  SpO2: 97%  Weight: 172 lb (78 kg)  Height: 5' 6.5" (1.689 m)     Body mass index is 27.35 kg/m.  Physical Exam:   Physical Exam Vitals and nursing note reviewed.  Constitutional:      Appearance: He is well-developed.  HENT:     Head: Normocephalic.  Eyes:     Conjunctiva/sclera: Conjunctivae normal.     Pupils: Pupils are equal, round, and reactive to light.  Pulmonary:     Effort: Pulmonary effort is normal.  Musculoskeletal:        General: Normal range of motion.     Cervical back: Normal range of motion.     Comments: Localized swelling to right olecranon, no induration or erythema Swelling is quite fluctuant No tenderness to palpation  Skin:    General: Skin is warm and dry.  Neurological:     Mental Status: He is alert and oriented to person, place, and time.  Psychiatric:        Behavior: Behavior normal.        Thought Content: Thought content normal.        Judgment: Judgment normal.     Assessment and Plan:   Olecranon bursitis of right elbow No red flags on exam Do not feel as though patient needs imaging or antibiotics at this time Discussed conservative treatment with compression and anti-inflammatories as needed If symptoms worsen or persist we  will refer to sports medicine    Alexander Iba, PA-C  I,Safa Melven Stable Kadhim,acting as a scribe for Alexander Iba, PA.,have documented all relevant documentation on the behalf of Alexander Iba, PA,as directed by  Alexander Iba,  PA while in the presence of Alexander Iba, [place].   I, Alexander Iba, [place], have reviewed all documentation for this visit. The documentation on 12/28/23 for the exam, diagnosis, procedures, and orders are all accurate and complete.

## 2024-01-10 ENCOUNTER — Other Ambulatory Visit (HOSPITAL_BASED_OUTPATIENT_CLINIC_OR_DEPARTMENT_OTHER): Payer: Self-pay | Admitting: Cardiology

## 2024-01-10 DIAGNOSIS — I5042 Chronic combined systolic (congestive) and diastolic (congestive) heart failure: Secondary | ICD-10-CM

## 2024-02-19 ENCOUNTER — Encounter: Payer: Self-pay | Admitting: Family Medicine

## 2024-02-19 ENCOUNTER — Ambulatory Visit (INDEPENDENT_AMBULATORY_CARE_PROVIDER_SITE_OTHER): Admitting: Family Medicine

## 2024-02-19 VITALS — BP 108/64 | HR 72 | Temp 98.1°F | Ht 66.5 in | Wt 172.0 lb

## 2024-02-19 DIAGNOSIS — E1165 Type 2 diabetes mellitus with hyperglycemia: Secondary | ICD-10-CM | POA: Diagnosis not present

## 2024-02-19 DIAGNOSIS — Z7984 Long term (current) use of oral hypoglycemic drugs: Secondary | ICD-10-CM | POA: Diagnosis not present

## 2024-02-19 DIAGNOSIS — G720 Drug-induced myopathy: Secondary | ICD-10-CM

## 2024-02-19 DIAGNOSIS — Z0000 Encounter for general adult medical examination without abnormal findings: Secondary | ICD-10-CM

## 2024-02-19 MED ORDER — TOUJEO MAX SOLOSTAR 300 UNIT/ML ~~LOC~~ SOPN: 24.0000 [IU] | PEN_INJECTOR | Freq: Every day | SUBCUTANEOUS | 3 refills | Status: AC

## 2024-02-19 NOTE — Progress Notes (Signed)
 Phone: [phone]   Subjective:  Patient presents today for their annual physical. Chief complaint-noted.   See problem oriented charting- ROS- full  review of systems was completed and negative  Per full ROS sheet completed by patient except for topics noted under acute/chronic concerns  The following were reviewed and entered/updated in epic: Past Medical History:  Diagnosis Date   Carotid bruit    Diabetes mellitus without complication (HCC)    Eczema    Heart failure (HCC)    History of colonic polyps    Hypercholesterolemia    Hyperlipidemia    Macular degeneration    Osteoarthritis    Paresthesias    Pseudogout    Subclavian steal syndrome    Patient Active Problem List   Diagnosis Date Noted   Poorly controlled diabetes mellitus (HCC) 10/01/2020    Priority: High   Ischemic cardiomyopathy 07/25/2019    Priority: High   Chronic combined systolic and diastolic heart failure (HCC) 07/12/2018    Priority: High   Coronary artery disease due to lipid rich plaque 07/12/2018    Priority: High   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    Priority: Medium    Seronegative rheumatoid arthritis (HCC) 10/14/2021    Priority: Medium    Pseudogout involving multiple joints 10/01/2020    Priority: Medium    Mixed hyperlipidemia 07/12/2018    Priority: Medium    Statin intolerance 07/12/2018    Priority: Medium    Pulmonary HTN (HCC) 06/14/2018    Priority: Medium    COVID-19 07/13/2021    Priority: Low   Personal history of COVID-19 10/25/2020    Priority: Low   Pulmonary emphysema (HCC) 06/14/2018    Priority: Low   Type 2 diabetes mellitus with other circulatory complication, with long-term current use of insulin  (HCC) 09/18/2023   Past Surgical History:  Procedure Laterality Date   CARDIAC CATHETERIZATION     CATARACT EXTRACTION Bilateral    jan and feb 2025   COLONOSCOPY WITH PROPOFOL  N/A 10/24/2016   Procedure: COLONOSCOPY WITH PROPOFOL ;  Surgeon: Garrett Kallman, MD;  Location: [facility];  Service: Endoscopy;  Laterality: N/A;   HYDROCELE EXCISION / REPAIR     KNEE SURGERY     REFRACTIVE SURGERY     TONSILLECTOMY      Family History  Problem Relation Age of Onset   Heart failure Father    Bipolar disorder Brother    Diabetes Brother    Cancer Paternal Grandmother     Medications- reviewed and updated Current Outpatient Medications  Medication Sig Dispense Refill   aspirin EC 81 MG tablet Take 81 mg by mouth daily. Swallow whole.     B Complex Vitamins (VITAMIN B COMPLEX) TABS Take by mouth.     Cholecalciferol (VITAMIN D3 PO) Take 1 capsule by mouth daily.     colchicine  0.6 MG tablet Take 1 tablet (0.6 mg total) by mouth daily. 90 tablet 0   Continuous Glucose Receiver (DEXCOM G7 RECEIVER) DEVI Place on arm to monitor blood sugars continually. Dx: E11.9 1 each 3   Continuous Glucose Sensor (DEXCOM G7 SENSOR) MISC Apply sensor every 10 days. Pharmacy please provide 3 boxes (1 sensor per week) 9 each 3   CONTOUR NEXT TEST test strip      ENTRESTO  97-103 MG TAKE 1 TABLET BY MOUTH TWICE DAILY 180 tablet 3   Evolocumab  (REPATHA  SURECLICK) 140 MG/ML SOAJ ADMINISTER 1 ML(140 MG) UNDER THE SKIN EVERY 14 DAYS 6 mL 3  FARXIGA  10 MG TABS tablet Take 1 tablet (10 mg total) by mouth daily before breakfast. 90 tablet 3   furosemide  (LASIX ) 20 MG tablet TAKE 1 TABLET BY MOUTH DAILY AS NEEDED FOR EDEMA 90 tablet 2   gabapentin  (NEURONTIN ) 100 MG capsule Take 1 capsule (100 mg total) by mouth 3 (three) times daily. 270 capsule 3   hydroxychloroquine (PLAQUENIL) 200 MG tablet Take 200 mg by mouth 2 (two) times daily.     Insulin  Pen Needle (B-D ULTRAFINE III SHORT PEN) 31G X 8 MM MISC USE AS DIRECTED TO INJECT BASAGLAR  EVERY DAY 100 each 3   metoprolol  succinate (TOPROL -XL) 50 MG 24 hr tablet TAKE 1 TABLET(50 MG) BY MOUTH DAILY 90 tablet 3   nitroGLYCERIN  (NITROSTAT ) 0.4 MG SL tablet Place 1 tablet (0.4 mg total) under the tongue every 5 (five)  minutes as needed for chest pain. 90 tablet 3   triamcinolone  (KENALOG ) 0.1 % 1 application to affected area     insulin  glargine, 2 Unit Dial, (TOUJEO  MAX SOLOSTAR) 300 UNIT/ML Solostar Pen Inject 24 Units into the skin daily. 12 mL 3   metFORMIN  (GLUCOPHAGE -XR) 500 MG 24 hr tablet Take 1 tablet (500 mg total) by mouth 2 (two) times daily with a meal. (Patient taking differently: Take 500 mg by mouth daily with breakfast.) 180 tablet 3   No current facility-administered medications for this visit.    Allergies-reviewed and updated Allergies  Allergen Reactions   Jardiance  [Empagliflozin ] Other (See Comments)    Pain and stiffness   Statins Other (See Comments)    Terrible joint pain   Tetanus Toxoids Other (See Comments)    Fever 104/105 & arm stiffiness    Social History   Social History Narrative   Married. 2 children. 3 grandkids.       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    - congregational [employer]      Hobbies: golf, woodworking, Equities trader for fly fishing   Objective  Objective:  BP 108/64   Pulse 72   Temp 98.1 F (36.7 C)   Ht 5' 6.5 (1.689 m)   Wt 172 lb (78 kg)   SpO2 96%   BMI 27.35 kg/m  Gen: NAD, resting comfortably HEENT: Mucous membranes are moist. Oropharynx normal Neck: no thyromegaly CV: RRR no murmurs rubs or gallops Lungs: CTAB no crackles, wheeze, rhonchi Abdomen: soft/nontender/nondistended/normal bowel sounds. No rebound or guarding.  Ext: no edema Skin: warm, dry Neuro: grossly normal, moves all extremities, PERRLA  Diabetic foot exam was performed with the following findings:   No deformities, ulcerations, or other skin breakdown Normal sensation of 10g monofilament Intact posterior tibialis and dorsalis pedis pulses       Assessment and Plan  78 y.o. male presenting for annual physical.  Health Maintenance counseling: 1. Anticipatory guidance: Patient counseled regarding regular dental exams  -q6 months, eye exams -yearly,  avoiding smoking and second hand smoke , limiting alcohol to 2 beverages per day - maybe once a summer, no illicit drugs  other than THC for sleep  2. Risk factor reduction:  Advised patient of need for regular exercise and diet rich and fruits and vegetables to reduce risk of heart attack and stroke.  Exercise- feels could improve- walking down lately.  Diet/weight management-within 2 lbs of last year.  Wt Readings from Last 3 Encounters:  02/19/24 172 lb (78 kg)  12/28/23 172 lb (78 kg)  12/25/23 171 lb (  77.6 kg)  3. Immunizations/screenings/ancillary studies- Tetanus, Diphtheria, and Pertussis (Tdap) allergy Immunization History  Administered Date(s) Administered   Fluad Quad(high Dose 65+) 04/29/2019, 06/21/2021, 06/07/2022   Fluad Trivalent(High Dose 65+) 05/15/2023   Influenza, High Dose Seasonal PF 07/05/2017   MODERNA COVID-19 SARS-COV-2 PEDS BIVALENT BOOSTER 37yr-31yr 05/25/2022   PFIZER Comirnaty(Gray Top)Covid-19 Tri-Sucrose Vaccine 10/03/2019, 10/25/2019, 06/01/2020, 06/10/2021   PNEUMOCOCCAL CONJUGATE-20 07/04/2022   Pfizer(Comirnaty)Fall Seasonal Vaccine 12 years and older 07/20/2023   Pneumococcal Conjugate-13 07/03/2014   Pneumococcal Polysaccharide-23 09/14/2008   Unspecified SARS-COV-2 Vaccination 06/10/2021   Zoster Recombinant(Shingrix) 02/19/2019, 06/04/2019   4. Prostate cancer screening- past age based screening recommendations   5. Colon cancer screening - with Dr. Reina Cara 6. Skin cancer screening- see dermatology as needed . advised regular sunscreen use. Denies worrisome, changing, or new skin lesions.  7. Smoking associated screening (lung cancer screening, AAA screen 65-75, UA)- former smoker- quit 2018 and graduated from lung cancer screening program 8. 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  Exercise and diet- best readings after golf but too hot. Walking down as wife broke foot- harder with the heat- had been twice a week Lab Results  Component Value Date   HGBA1C 8.0 (H) 12/25/2023   HGBA1C 8.7 08/24/2023   HGBA1C 8.6 (H) 05/04/2023   A/P: diabetes less than ideally controlled - discussed mealtime insulin  vs reducing toujeo  and doing healthier bedtime snacks vs endocrine consult and opts for endocrine but with lows in the last month still want him to go back to toujeo  24 units  I discussed the microalbumin to creatinine lab error with patient that occurred with [facility].  Essentially the ratio was off by a factor of 10.  In this patient's individual case his adjusted most recent level was 25- on entresto  and farxiga   #CAD-follows with Dr. 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 Shortness of breath with hills  and bending over Lab Results  Component Value Date   CHOL 126 05/04/2023   HDL 42.50 05/04/2023   LDLCALC 31 12/24/2019   LDLDIRECT 64.0 05/04/2023   TRIG 287.0 (H) 05/04/2023   CHOLHDL 3 05/04/2023   A/P: CAD - largely asymptomatic - continue current medications   CHF - appears euvolemic- continue current medications  Lipids-at goal- phenomenal control- other than triglyceride(s) and we need to get blood sugar down to help this   #COPD/former smoker S: medication: none -graduated from  lung cancer screening at age 78 A/P: copd stable without treatment- continue to monitor    #Seronegative RA per Dr. Meredith Stalls- has bene told psoriatic arthritis S: on hydroxychloroquine 200mg   for possible psoriatic arthritis, on turmeric.  Follows with Dr. Meredith Stalls- sees thursday A/P: overall stable- continue current medications    # psuedogout S:Colchicine  through Dr. Meredith Stalls  -started on b complex as well- apparently had lows A/P: no recent flares- continue to monitor    #pancreatic lesion-Dr. Reina Cara 12/05/23 good report and 1 year follow up   Recommended follow up: Return in about 6 months (around 08/20/2024) for followup or sooner if needed.Schedule b4 you leave. Future Appointments  Date Time Provider Department Center  03/05/2024  8:20 AM Sheryle Donning, MD [facility]  09/03/2024  8:00 AM [facility] ANNUAL WELLNESS VISIT 1 [facility]   Lab/Order associations:   ICD-10-CM   1. Preventative health care  Z00.00     2. Drug-induced myopathy  G72.0     3. Poorly controlled diabetes mellitus (HCC)  E11.65 Ambulatory referral to Endocrinology      Meds ordered this encounter  Medications   insulin  glargine, 2 Unit Dial, (TOUJEO  MAX SOLOSTAR) 300 UNIT/ML Solostar Pen    Sig: Inject 24 Units into the skin daily.    Dispense:  12 mL    Refill:  3    Return precautions advised.  Clarisa Crooked, MD

## 2024-02-19 NOTE — Patient Instructions (Addendum)
 We have placed a referral for you today to endocrinology- Dr. Aretha Kubas or Dr. Rosalea Collin- please call their # if you do not hear within a week (may be listed below or you may see mychart message within a few days with #).   Recommended follow up: Return in about 6 months (around 08/20/2024) for followup or sooner if needed.Schedule b4 you leave.

## 2024-02-22 DIAGNOSIS — M1120 Other chondrocalcinosis, unspecified site: Secondary | ICD-10-CM | POA: Diagnosis not present

## 2024-02-22 DIAGNOSIS — M1991 Primary osteoarthritis, unspecified site: Secondary | ICD-10-CM | POA: Diagnosis not present

## 2024-02-22 DIAGNOSIS — Z79899 Other long term (current) drug therapy: Secondary | ICD-10-CM | POA: Diagnosis not present

## 2024-02-22 DIAGNOSIS — M0609 Rheumatoid arthritis without rheumatoid factor, multiple sites: Secondary | ICD-10-CM | POA: Diagnosis not present

## 2024-03-04 ENCOUNTER — Ambulatory Visit: Admitting: Podiatry

## 2024-03-04 ENCOUNTER — Encounter: Payer: Self-pay | Admitting: Podiatry

## 2024-03-04 VITALS — Ht 66.5 in | Wt 172.0 lb

## 2024-03-04 DIAGNOSIS — M7672 Peroneal tendinitis, left leg: Secondary | ICD-10-CM

## 2024-03-04 DIAGNOSIS — M7671 Peroneal tendinitis, right leg: Secondary | ICD-10-CM

## 2024-03-04 MED ORDER — TRIAMCINOLONE ACETONIDE 10 MG/ML IJ SUSP
10.0000 mg | Freq: Once | INTRAMUSCULAR | Status: AC
  Administered 2024-03-04: 10 mg via INTRA_ARTICULAR

## 2024-03-04 NOTE — Progress Notes (Signed)
 Subjective:   Patient ID: Casey Lynch Candy, male   DOB: 78 y.o.   MRN: [id number]   HPI Patient presents with a lot of pain on the sides of both feet and states that it has been inflamed and sore   ROS      Objective:  Physical Exam  Neurovascular status intact with inflammation of the peroneal tendon bilateral with fluid buildup noted     Assessment:  Chronic tendinitis bilateral peroneal insertion     Plan:  H&P reviewed condition discussed treatment options patient opted for injections understanding risk and I did careful sterile prep and injected the tendon insertion bilateral 3 mg dexamethasone  Kenalog  5 mg Xylocaine  applied sterile dressing reappoint as needed

## 2024-03-05 ENCOUNTER — Ambulatory Visit (HOSPITAL_BASED_OUTPATIENT_CLINIC_OR_DEPARTMENT_OTHER): Admitting: Cardiology

## 2024-03-05 ENCOUNTER — Encounter (HOSPITAL_BASED_OUTPATIENT_CLINIC_OR_DEPARTMENT_OTHER): Payer: Self-pay | Admitting: Cardiology

## 2024-03-05 VITALS — BP 102/72 | HR 64 | Ht 66.0 in | Wt 172.6 lb

## 2024-03-05 DIAGNOSIS — R0609 Other forms of dyspnea: Secondary | ICD-10-CM | POA: Diagnosis not present

## 2024-03-05 DIAGNOSIS — I255 Ischemic cardiomyopathy: Secondary | ICD-10-CM

## 2024-03-05 DIAGNOSIS — I25118 Atherosclerotic heart disease of native coronary artery with other forms of angina pectoris: Secondary | ICD-10-CM | POA: Diagnosis not present

## 2024-03-05 DIAGNOSIS — Z794 Long term (current) use of insulin: Secondary | ICD-10-CM

## 2024-03-05 DIAGNOSIS — E782 Mixed hyperlipidemia: Secondary | ICD-10-CM

## 2024-03-05 DIAGNOSIS — E1159 Type 2 diabetes mellitus with other circulatory complications: Secondary | ICD-10-CM

## 2024-03-05 DIAGNOSIS — Z789 Other specified health status: Secondary | ICD-10-CM

## 2024-03-05 DIAGNOSIS — I5042 Chronic combined systolic (congestive) and diastolic (congestive) heart failure: Secondary | ICD-10-CM

## 2024-03-05 MED ORDER — ENTRESTO 97-103 MG PO TABS: 1.0000 | ORAL_TABLET | Freq: Two times a day (BID) | ORAL | 3 refills | Status: AC

## 2024-03-05 MED ORDER — METOPROLOL SUCCINATE ER 50 MG PO TB24: 50.0000 mg | ORAL_TABLET | Freq: Every day | ORAL | 3 refills | Status: AC

## 2024-03-05 MED ORDER — NITROGLYCERIN 0.4 MG SL SUBL: 0.4000 mg | SUBLINGUAL_TABLET | SUBLINGUAL | 3 refills | Status: AC | PRN

## 2024-03-05 NOTE — Progress Notes (Signed)
 Cardiology Office Note:  .    Date:  03/05/2024  ID:  Casey Lynch, DOB [birth year], MRN [id number] PCP: Katrinka Garnette KIDD, MD  [facility] Providers Cardiologist:  Shelda Bruckner, MD     History of Present Illness: .    Casey Lynch is a 78 y.o. male with a hx of with CAD, chronic systolic and diastolic heart failure, ischemic cardiomyopathy, hyperlipidemia, PAD (carotid bruit <50% bilaterally, left subclavian steal syndrome), COPD, former tobacco abuse, OSA on CPAP, type II diabetes, here for follow up. I met him 07/11/2018 and his initial cardiac evaluation from [place]  is summarized in that note.   Today: Since our last visit, has been seen by his GI team at [facility], being followed for ampulla adenoma, EGD 12/2023 reviewed (polyps removed, no high grade dysplasia). Pending MRCP.  Reviewed notes with Dr. Katrinka, working to optimize diabetes control, waiting to hear from endocrinology to schedule an appt.  Still playing golf twice/week. Unchanged mild DOE with hills, has bendopnea. No LE edema. Has not needed nitroglycerin  at all. Rarely needs lasix .   BP borderline low, but no symptoms at all.  ROS:  Denies shortness of breath at rest. No PND, orthopnea, LE edema or unexpected weight gain. No syncope or palpitations. ROS otherwise negative except as noted.   Studies Reviewed: SABRA    EKG Interpretation Date/Time:  Tuesday March 05 2024 08:16:46 EDT Ventricular Rate:  64 PR Interval:  164 QRS Duration:  144 QT Interval:  422 QTC Calculation: 435 R Axis:   -39  Text Interpretation: Normal sinus rhythm Left axis deviation Right bundle branch block Confirmed by Bruckner Shelda [phone]) on 03/05/2024 12:00:36 PM    Physical Exam:    VS:  BP 102/72   Pulse 64   Ht 5' 6 (1.676 m)   Wt 172 lb 9.6 oz (78.3 kg)   SpO2 99%   BMI 27.86 kg/m    Wt Readings from Last 3 Encounters:  03/05/24 172 lb 9.6 oz (78.3 kg)  03/04/24 172 lb (78 kg)  02/19/24 172 lb (78 kg)     GEN: Well nourished, well developed in no acute distress HEENT: Normal, moist mucous membranes NECK: No JVD CARDIAC: regular rhythm, normal S1 and S2, no rubs or gallops. No murmur. VASCULAR: Radial and DP pulses 2+ bilaterally. L carotid bruit RESPIRATORY:  Clear to auscultation without rales, wheezing or rhonchi  ABDOMEN: Soft, non-tender, non-distended MUSCULOSKELETAL:  Ambulates independently SKIN: Warm and dry, no edema NEUROLOGIC:  Alert and oriented x 3. No focal neuro deficits noted. PSYCHIATRIC:  Normal affect    ASSESSMENT AND PLAN: .    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 Continue to monitor   Type II diabetes, on insulin  -has neuropathy and vascular disease as  diabetic complication -last A1c 8.0 -has had some weight gain  on glargine -tolerating Farxiga  -had significant nausea/GERD on GLP1   Cardiac risk counseling and prevention recommendations: -recommend heart healthy/Mediterranean diet, with whole grains, fruits, vegetable, fish, lean meats, nuts, and olive oil. Limit salt. -recommend moderate walking, 3-5 times/week for 30-50 minutes each session. Aim for at least 150 minutes.week. Goal should be pace of 3 miles/hours, or walking 1.5 miles in 30 minutes -recommend avoidance of tobacco products. Avoid excess alcohol.  Dispo: Follow-up in 6 months, or sooner as needed.  Signed, Shelda Bruckner, MD

## 2024-03-05 NOTE — Patient Instructions (Signed)
 Medication Instructions:  Your physician recommends that you continue on your current medications as directed. Please refer to the Current Medication list given to you today.  *If you need a refill on your cardiac medications before your next appointment, please call your pharmacy*  Lab Work: NONE  Testing/Procedures: NONE  Follow-Up: At [facility], you and your health needs are our priority.  As part of our continuing mission to provide you with exceptional heart care, we have created designated Provider Care Teams.  These Care Teams include your primary Cardiologist (physician) and Advanced Practice Providers (APPs -  Physician Assistants and Nurse Practitioners) who all work together to provide you with the care you need, when you need it.  We recommend signing up for the patient portal called MyChart.  Sign up information is provided on this After Visit Summary.  MyChart is used to connect with patients for Virtual Visits (Telemedicine).  Patients are able to view lab/test results, encounter notes, upcoming appointments, etc.  Non-urgent messages can be sent to your provider as well.   To learn more about what you can do with MyChart, go to [email or web address].    Your next appointment:   6 month(s)  The format for your next appointment:   In Person  Provider:   Shelda Bruckner, MD Reche ORN NP or Janeth ORN NP

## 2024-03-27 ENCOUNTER — Encounter: Payer: Self-pay | Admitting: Family Medicine

## 2024-03-27 ENCOUNTER — Other Ambulatory Visit: Payer: Self-pay

## 2024-03-27 MED ORDER — DEXCOM G7 RECEIVER DEVI: 3 refills | Status: AC

## 2024-03-27 MED ORDER — DEXCOM G7 SENSOR MISC: 3 refills | Status: AC

## 2024-04-01 DIAGNOSIS — K080 Exfoliation of teeth due to systemic causes: Secondary | ICD-10-CM | POA: Diagnosis not present

## 2024-04-04 ENCOUNTER — Encounter: Payer: Self-pay | Admitting: Family Medicine

## 2024-04-04 ENCOUNTER — Other Ambulatory Visit: Payer: Self-pay

## 2024-04-04 ENCOUNTER — Telehealth: Payer: Self-pay | Admitting: Family Medicine

## 2024-04-04 MED ORDER — INSULIN PEN NEEDLE 31G X 8 MM MISC: 3 refills | Status: AC

## 2024-04-04 NOTE — Telephone Encounter (Signed)
 Refill sent.

## 2024-04-04 NOTE — Telephone Encounter (Unsigned)
 Copied from CRM [id number]. Topic: Clinical - Medication Refill >> Apr 04, 2024 12:12 PM Chiquita SQUIBB wrote: Medication: Insulin  Pen Needle (B-D ULTRAFINE III SHORT PEN) 31G X 8 MM MISC [[id number]]  Has the patient contacted their pharmacy? Yes (Agent: If no, request that the patient contact the pharmacy for the refill. If patient does not wish to contact the pharmacy document the reason why and proceed with request.) (Agent: If yes, when and what did the pharmacy advise?)  This is the patient's preferred pharmacy:   [facility] [id number] [place], [place] - [address] [place] [address] Phone: [phone] Fax: [phone]  Is this the correct pharmacy for this prescription? Yes If no, delete pharmacy and type the correct one.   Has the prescription been filled recently? No  Is the patient out of the medication? Yes  Has the patient been seen for an appointment in the last year OR does the patient have an upcoming appointment? Yes  Can we respond through MyChart? Yes  Agent: Please be advised that Rx refills may take up to 3 business days. We ask that you follow-up with your pharmacy.

## 2024-04-10 DIAGNOSIS — D135 Benign neoplasm of extrahepatic bile ducts: Secondary | ICD-10-CM | POA: Diagnosis not present

## 2024-04-12 ENCOUNTER — Other Ambulatory Visit: Payer: Self-pay | Admitting: Cardiology

## 2024-04-12 DIAGNOSIS — I255 Ischemic cardiomyopathy: Secondary | ICD-10-CM

## 2024-04-25 DIAGNOSIS — K080 Exfoliation of teeth due to systemic causes: Secondary | ICD-10-CM | POA: Diagnosis not present

## 2024-05-13 ENCOUNTER — Encounter: Payer: Self-pay | Admitting: Family Medicine

## 2024-05-13 ENCOUNTER — Other Ambulatory Visit: Payer: Self-pay | Admitting: Family Medicine

## 2024-05-13 MED ORDER — COVID-19 MRNA VACC (MODERNA) 50 MCG/0.5ML IM SUSP: 0.5000 mL | Freq: Once | INTRAMUSCULAR | 0 refills | Status: AC

## 2024-05-20 ENCOUNTER — Encounter (HOSPITAL_BASED_OUTPATIENT_CLINIC_OR_DEPARTMENT_OTHER): Payer: Self-pay

## 2024-05-22 ENCOUNTER — Encounter: Payer: Self-pay | Admitting: Family Medicine

## 2024-05-23 DIAGNOSIS — Z79899 Other long term (current) drug therapy: Secondary | ICD-10-CM | POA: Diagnosis not present

## 2024-05-23 DIAGNOSIS — M069 Rheumatoid arthritis, unspecified: Secondary | ICD-10-CM | POA: Diagnosis not present

## 2024-05-23 DIAGNOSIS — H353131 Nonexudative age-related macular degeneration, bilateral, early dry stage: Secondary | ICD-10-CM | POA: Diagnosis not present

## 2024-05-23 LAB — HM DIABETES EYE EXAM

## 2024-06-11 ENCOUNTER — Ambulatory Visit: Admitting: "Endocrinology

## 2024-06-13 ENCOUNTER — Encounter: Admitting: Family Medicine

## 2024-06-24 ENCOUNTER — Encounter: Payer: Self-pay | Admitting: Family Medicine

## 2024-06-24 ENCOUNTER — Telehealth: Payer: Self-pay | Admitting: Family Medicine

## 2024-06-24 DIAGNOSIS — Q453 Other congenital malformations of pancreas and pancreatic duct: Secondary | ICD-10-CM

## 2024-06-24 NOTE — Telephone Encounter (Signed)
 Have already sent you one encounter regarding this. Sorry for duplicate messages.

## 2024-06-24 NOTE — Telephone Encounter (Signed)
 We are more than happy to enter referral but the problem is that to get into our GI it may take 3 months-it may be more efficient for him to keep his current set up and try to get rescheduled.  Please let me know his preference

## 2024-06-24 NOTE — Telephone Encounter (Signed)
 Please see patient message and advise.   Copied from CRM #[id number]. Topic: Referral - Request for Referral >> Jun 24, 2024 12:12 PM [name] wrote: Did the patient discuss referral with their provider in the last year? Yes  (If No - schedule appointment) (If Yes - send message)  Appointment offered? No  Type of order/referral and detailed reason for visit: Gastroenterologist that does ercp. Patient is not having the best communication/feedback with [facility]. Patient would like to have a referral asap because he'll need to cancel the appointments with [facility].   Preference of office, provider, location: within [facility]   If referral order, have you been seen by this specialty before? Yes (If Yes, this issue or another issue? When? Where?  Can we respond through MyChart? Yes but patient would prefer a phone call [phone] (H).

## 2024-07-02 ENCOUNTER — Ambulatory Visit (INDEPENDENT_AMBULATORY_CARE_PROVIDER_SITE_OTHER): Admitting: "Endocrinology

## 2024-07-02 ENCOUNTER — Encounter: Payer: Self-pay | Admitting: "Endocrinology

## 2024-07-02 ENCOUNTER — Other Ambulatory Visit

## 2024-07-02 VITALS — BP 122/82 | HR 87 | Ht 66.0 in | Wt 175.0 lb

## 2024-07-02 DIAGNOSIS — E782 Mixed hyperlipidemia: Secondary | ICD-10-CM

## 2024-07-02 DIAGNOSIS — Z7984 Long term (current) use of oral hypoglycemic drugs: Secondary | ICD-10-CM

## 2024-07-02 DIAGNOSIS — Z794 Long term (current) use of insulin: Secondary | ICD-10-CM

## 2024-07-02 DIAGNOSIS — E1165 Type 2 diabetes mellitus with hyperglycemia: Secondary | ICD-10-CM | POA: Diagnosis not present

## 2024-07-02 LAB — POCT GLYCOSYLATED HEMOGLOBIN (HGB A1C): Hemoglobin A1C: 7.5 % — AB (ref 4.0–5.6)

## 2024-07-02 MED ORDER — BAQSIMI ONE PACK 3 MG/DOSE NA POWD: 1.0000 | NASAL | 3 refills | Status: AC | PRN

## 2024-07-02 NOTE — Progress Notes (Signed)
 Outpatient Endocrinology Note Casey Birmingham, MD  07/02/24   Casey Lynch 1946/05/19 [id number]  Referring Provider: Katrinka Garnette KIDD, MD Primary Care Provider: Katrinka Garnette KIDD, MD Reason for consultation: Subjective   Assessment & Plan  Diagnoses and all orders for this visit:  Uncontrolled type 2 diabetes mellitus with hyperglycemia (HCC) -     POCT glycosylated hemoglobin (Hb A1C) -     Glucagon  (BAQSIMI  ONE PACK) 3 MG/DOSE POWD; Place 1 Device into the nose as needed (Low blood sugar with impaired consciousness). -     Microalbumin / creatinine urine ratio  Long term (current) use of oral hypoglycemic drugs  Long-term insulin  use (HCC)  Mixed hypercholesterolemia and hypertriglyceridemia   Diabetes Type II complicated by neuropathy,  Lab Results  Component Value Date   GFR 44.62 (L) 12/25/2023   Hba1c goal less than 7, current Hba1c is  Lab Results  Component Value Date   HGBA1C 7.5 (A) 07/02/2024   Will recommend the following: Metformin  XR 500mg  bid  Farxiga  10 mg every day  Toujeo  22 units qam  On gabapentin  200mg  at bedtime, recommend capsaicin cream (has toe tips burning, bottom>top)  No known contraindications/side effects to any of above medications Baqsimi  discussed and prescribed with refills on 07/02/24   -Last LD and Tg are as follows: Lab Results  Component Value Date   LDLCALC 31 12/24/2019    Lab Results  Component Value Date   TRIG 287.0 (H) 05/04/2023   -not on statin, on repatha  q14 days   -Follow low fat diet and exercise   -Blood pressure goal <140/90 - Microalbumin/creatinine goal is < 30 -Last MA/Cr is as follows: No results found for: MICROALBUR, MALB24HUR - on ACE/ARB  -diet changes including salt restriction -limit eating outside -counseled BP targets per standards of diabetes care -uncontrolled blood pressure can lead to retinopathy, nephropathy and cardiovascular and atherosclerotic heart disease  Reviewed  and counseled on: -A1C target -Blood sugar targets -Complications of uncontrolled diabetes  -Checking blood sugar before meals and bedtime and bring log next visit -All medications with mechanism of action and side effects -Hypoglycemia management: rule of 15's, Glucagon  Emergency Kit and medical alert ID -low-carb low-fat plate-method diet -At least 20 minutes of physical activity per day -Annual dilated retinal eye exam and foot exam -compliance and follow up needs -follow up as scheduled or earlier if problem gets worse  Call if blood sugar is less than 70 or consistently above 250    Take a 15 gm snack of carbohydrate at bedtime before you go to sleep if your blood sugar is less than 100.    If you are going to fast after midnight for a test or procedure, ask your physician for instructions on how to reduce/decrease your insulin  dose.    Call if blood sugar is less than 70 or consistently above 250  -Treating a low sugar by rule of 15  (15 gms of sugar every 15 min until sugar is more than 70) If you feel your sugar is low, test your sugar to be sure If your sugar is low (less than 70), then take 15 grams of a fast acting Carbohydrate (3-4 glucose tablets or glucose gel or 4 ounces of juice or regular soda) Recheck your sugar 15 min after treating low to make sure it is more than 70 If sugar is still less than 70, treat again with 15 grams of carbohydrate  Don't drive the hour of hypoglycemia  If unconscious/unable to eat or drink by mouth, use glucagon  injection or nasal spray baqsimi  and call 911. Can repeat again in 15 min if still unconscious.  Return in about 3 months (around 10/02/2024) for visit, labs today.   I have reviewed current medications, nurse's notes, allergies, vital signs, past medical and surgical history, family medical history, and social history for this encounter. Counseled patient on symptoms, examination findings, lab findings, imaging results,  treatment decisions and monitoring and prognosis. The patient understood the recommendations and agrees with the treatment plan. All questions regarding treatment plan were fully answered.  Casey Birmingham, MD  07/02/24    History of Present Illness Casey Lynch is a 78 y.o. year old male who presents for evaluation of Type II diabetes mellitus.  Casey Lynch was first diagnosed around age  18. Diabetes education +  Home diabetes regimen: Metformin  XR 500mg  every day  Farxiga  10 mg every day  Toujeo  22 units qam   COMPLICATIONS -  MI/Stroke, + HF -  retinopathy +  neuropathy +  nephropathy  SYMPTOMS REVIEWED - Polyuria - Weight loss + Blurred vision  BLOOD SUGAR DATA CGM interpretation: At today's visit, we reviewed her CGM downloads. The full report is scanned in the media. Reviewing the CGM trends, BG are elevated after each meals.    Physical Exam  BP 122/82   Pulse 87   Ht 5' 6 (1.676 m)   Wt 175 lb (79.4 kg)   SpO2 96%   BMI 28.25 kg/m    Constitutional: well developed, well nourished Head: normocephalic, atraumatic Eyes: sclera anicteric, no redness Neck: supple Lungs: normal respiratory effort Neurology: alert and oriented Skin: dry, no appreciable rashes Musculoskeletal: no appreciable defects Psychiatric: normal mood and affect Diabetic Foot Exam - Simple   No data filed      Current Medications Patient's Medications  New Prescriptions   GLUCAGON  (BAQSIMI  ONE PACK) 3 MG/DOSE POWD    Place 1 Device into the nose as needed (Low blood sugar with impaired consciousness).  Previous Medications   ASPIRIN EC 81 MG TABLET    Take 81 mg by mouth daily. Swallow whole.   B COMPLEX VITAMINS (VITAMIN B COMPLEX) TABS    Take by mouth.   CHOLECALCIFEROL (VITAMIN D3 PO)    Take 1 capsule by mouth daily.   COLCHICINE  0.6 MG TABLET    Take 1 tablet (0.6 mg total) by mouth daily.   CONTINUOUS GLUCOSE RECEIVER (DEXCOM G7 RECEIVER) DEVI    Place on arm to monitor  blood sugars continually. Dx: E11.9   CONTINUOUS GLUCOSE SENSOR (DEXCOM G7 SENSOR) MISC    Apply sensor every 10 days. Pharmacy please provide 3 boxes (1 sensor per week)   CONTOUR NEXT TEST TEST STRIP       EVOLOCUMAB  (REPATHA  SURECLICK) 140 MG/ML SOAJ    ADMINISTER 1 ML(140 MG) UNDER THE SKIN EVERY 14 DAYS   FARXIGA  10 MG TABS TABLET    Take 1 tablet (10 mg total) by mouth daily before breakfast.   FUROSEMIDE  (LASIX ) 20 MG TABLET    TAKE 1 TABLET BY MOUTH DAILY AS NEEDED FOR EDEMA   GABAPENTIN  (NEURONTIN ) 100 MG CAPSULE    Take 1 capsule (100 mg total) by mouth 3 (three) times daily.   HYDROXYCHLOROQUINE (PLAQUENIL) 200 MG TABLET    Take 200 mg by mouth 2 (two) times daily.   INSULIN  GLARGINE, 2 UNIT DIAL, (TOUJEO  MAX SOLOSTAR) 300  UNIT/ML SOLOSTAR PEN    Inject 24 Units into the skin daily.   INSULIN  PEN NEEDLE 31G X 8 MM MISC    Use to inject insulin  daily. Dx: E11.9   METFORMIN  (GLUCOPHAGE -XR) 500 MG 24 HR TABLET    Take 1 tablet (500 mg total) by mouth 2 (two) times daily with a meal.   METOPROLOL  SUCCINATE (TOPROL -XL) 50 MG 24 HR TABLET    Take 1 tablet (50 mg total) by mouth daily. Take with or immediately following a meal.   NITROGLYCERIN  (NITROSTAT ) 0.4 MG SL TABLET    Place 1 tablet (0.4 mg total) under the tongue every 5 (five) minutes as needed for chest pain.   SACUBITRIL -VALSARTAN  (ENTRESTO ) 97-103 MG    Take 1 tablet by mouth 2 (two) times daily.   TRIAMCINOLONE  (KENALOG ) 0.1 %    1 application to affected area  Modified Medications   No medications on file  Discontinued Medications   No medications on file    Allergies Allergies  Allergen Reactions   Jardiance  [Empagliflozin ] Other (See Comments)    Pain and stiffness   Statins Other (See Comments)    Terrible joint pain   Tetanus Toxoid-Containing Vaccines Other (See Comments)    Fever 104/105 & arm stiffiness    Past Medical History Past Medical History:  Diagnosis Date   Carotid bruit    Diabetes mellitus  without complication (HCC)    Eczema    Heart failure (HCC)    History of colonic polyps    Hypercholesterolemia    Hyperlipidemia    Macular degeneration    Osteoarthritis    Paresthesias    Pseudogout    Subclavian steal syndrome     Past Surgical History Past Surgical History:  Procedure Laterality Date   CARDIAC CATHETERIZATION     CATARACT EXTRACTION Bilateral    jan and feb 2025   COLONOSCOPY WITH PROPOFOL  N/A 10/24/2016   Procedure: COLONOSCOPY WITH PROPOFOL ;  Surgeon: Gladis MARLA Louder, MD;  Location: [facility];  Service: Endoscopy;  Laterality: N/A;   HYDROCELE EXCISION / REPAIR     KNEE SURGERY     REFRACTIVE SURGERY     TONSILLECTOMY      Family History family history includes Bipolar disorder in his brother; Cancer in his paternal grandmother; Diabetes in his brother; Heart failure in his father.  Social History Social History   Socioeconomic History   Marital status: Married    Spouse name: Not on file   Number of children: Not on file   Years of education: Not on file   Highest education level: Doctorate  Occupational History   Not on file  Tobacco Use   Smoking status: Former    Current packs/day: 0.00    Average packs/day: 1 pack/day for 59.0 years (59.0 ttl pk-yrs)    Types: Cigarettes    Start date: 09/05/1957    Quit date: 09/05/2016    Years since quitting: 7.8   Smokeless tobacco: Never  Vaping Use   Vaping status: Never Used  Substance and Sexual Activity   Alcohol use: Not Currently    Comment: Rare   Drug use: No   Sexual activity: Yes  Other Topics Concern   Not on file  Social History Narrative   Married. 2 children. 3 grandkids.       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    - congregational [employer]      Hobbies: golf, woodworking,  fly tying for fly fishing   Social Drivers of Lynch   Financial Resource Strain: Low Risk  (02/16/2024)   Overall Financial Resource Strain (CARDIA)     Difficulty of Paying Living Expenses: Not hard at all  Food Insecurity: No Food Insecurity (02/16/2024)   Hunger Vital Sign    Worried About Running Out of Food in the Last Year: Never true    Ran Out of Food in the Last Year: Never true  Transportation Needs: No Transportation Needs (02/16/2024)   PRAPARE - Administrator, Civil Service (Medical): No    Lack of Transportation (Non-Medical): No  Physical Activity: Sufficiently Active (02/16/2024)   Exercise Vital Sign    Days of Exercise per Week: 5 days    Minutes of Exercise per Session: 50 min  Stress: No Stress Concern Present (02/16/2024)   Casey Lynch - Occupational Stress Questionnaire    Feeling of Stress: Not at all  Social Connections: Socially Integrated (02/16/2024)   Social Connection and Isolation Panel    Frequency of Communication with Friends and Family: More than three times a week    Frequency of Social Gatherings with Friends and Family: Once a week    Attends Religious Services: More than 4 times per year    Active Member of Clubs or Organizations: Yes    Attends Banker Meetings: More than 4 times per year    Marital Status: Married  Catering Manager Violence: Not At Risk (08/28/2023)   Humiliation, Afraid, Rape, and Kick questionnaire    Fear of Current or Ex-Partner: No    Emotionally Abused: No    Physically Abused: No    Sexually Abused: No    Lab Results  Component Value Date   HGBA1C 7.5 (A) 07/02/2024   HGBA1C 8.0 (H) 12/25/2023   HGBA1C 8.7 08/24/2023   Lab Results  Component Value Date   CHOL 126 05/04/2023   Lab Results  Component Value Date   HDL 42.50 05/04/2023   Lab Results  Component Value Date   LDLCALC 31 12/24/2019   Lab Results  Component Value Date   TRIG 287.0 (H) 05/04/2023   Lab Results  Component Value Date   CHOLHDL 3 05/04/2023   Lab Results  Component Value Date   CREATININE 1.49 12/25/2023   Lab Results   Component Value Date   GFR 44.62 (L) 12/25/2023   No results found for: MACKEY CURRENT    Component Value Date/Time   NA 138 12/25/2023 0916   NA 138 08/24/2023 0000   K 4.4 12/25/2023 0916   CL 101 12/25/2023 0916   CO2 28 12/25/2023 0916   GLUCOSE 132 (H) 12/25/2023 0916   BUN 23 12/25/2023 0916   BUN 26 (A) 08/24/2023 0000   CREATININE 1.49 12/25/2023 0916   CALCIUM  9.8 12/25/2023 0916   PROT 7.4 12/25/2023 0916   PROT 7.1 12/14/2018 0848   ALBUMIN 4.3 12/25/2023 0916   ALBUMIN 4.5 12/14/2018 0848   AST 18 12/25/2023 0916   ALT 23 12/25/2023 0916   ALKPHOS 90 12/25/2023 0916   BILITOT 0.4 12/25/2023 0916   BILITOT 0.2 12/14/2018 0848   GFRNONAA 58 (L) 12/24/2019 1605   GFRAA 67 12/24/2019 1605      Latest Ref Rng & Units 12/25/2023    9:16 AM 08/24/2023   12:00 AM 05/04/2023   12:37 PM  BMP  Glucose 70 - 99 mg/dL 867   835   BUN 6 -  23 mg/dL 23  26     24    Creatinine 0.40 - 1.50 mg/dL 8.50  1.5     8.51   Sodium 135 - 145 mEq/L 138  138     137   Potassium 3.5 - 5.1 mEq/L 4.4  5.2     4.6   Chloride 96 - 112 mEq/L 101   100   CO2 19 - 32 mEq/L 28   29   Calcium  8.4 - 10.5 mg/dL 9.8  9.9     89.4      This result is from an external source.       Component Value Date/Time   WBC 9.4 12/25/2023 0916   RBC 4.78 12/25/2023 0916   HGB 14.3 12/25/2023 0916   HGB 12.8 (L) 12/14/2018 0848   HCT 44.1 12/25/2023 0916   HCT 37.6 12/14/2018 0848   PLT 308.0 12/25/2023 0916   PLT 325 12/14/2018 0848   MCV 92.3 12/25/2023 0916   MCV 89 12/14/2018 0848   MCH 30.1 12/14/2018 0848   MCHC 32.5 12/25/2023 0916   RDW 14.1 12/25/2023 0916   RDW 13.2 12/14/2018 0848   LYMPHSABS 1.8 12/25/2023 0916   MONOABS 0.7 12/25/2023 0916   EOSABS 0.5 12/25/2023 0916   BASOSABS 0.1 12/25/2023 0916     Parts of this note may have been dictated using voice recognition software. There may be variances in spelling and vocabulary which are unintentional. Not all errors are  proofread. Please notify the dino if any discrepancies are noted or if the meaning of any statement is not clear.

## 2024-07-02 NOTE — Patient Instructions (Signed)

## 2024-07-03 ENCOUNTER — Encounter: Payer: Self-pay | Admitting: Family Medicine

## 2024-07-03 LAB — MICROALBUMIN / CREATININE URINE RATIO
Creatinine, Urine: 85 mg/dL (ref 20–320)
Microalb Creat Ratio: 34 mg/g{creat} — ABNORMAL HIGH (ref <30)
Microalb, Ur: 2.9 mg/dL

## 2024-07-05 ENCOUNTER — Encounter: Payer: Self-pay | Admitting: "Endocrinology

## 2024-07-12 ENCOUNTER — Telehealth: Payer: Self-pay | Admitting: Gastroenterology

## 2024-07-12 NOTE — Telephone Encounter (Signed)
 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 Patient has previous history with [facility].  Requesting transfer after speaking with primary care provider and being referred to you. Please review and advise on scheduling.     Thank you

## 2024-07-13 NOTE — Telephone Encounter (Signed)
 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 If he is adamant about changing (as it seems to be in regards to notation in the chart), we can try to be of assistance, because no one else in town would offer evaluation for ERCP potential needs for further follow-up of previous ampullectomies with documented recurrences. This patient would need to be seen in clinic by me (not an APP) if that were to be the case to discuss things further. As well, we can schedule an ERCP, but he would need to be seen in clinic first and this would likely be no sooner than the end of December or January. If this is what he wants, then we can work on scheduling. Set up the clinic visit (okay to overbook). And can set up the ERCP if he desires again after his clinic visit or he can come to clinic and then schedule from there. Let me know what he decides. GM

## 2024-07-14 ENCOUNTER — Encounter: Payer: Self-pay | Admitting: Pharmacist

## 2024-07-14 NOTE — Progress Notes (Signed)
 Pharmacy Quality Measure Review  Patient is on list form BCBS Medicare as needing to have kidney health evaluation for 2025. He has had UACR checked 07/02/2024 by endocrinology office but it did not include a simultaneous serum creatinine or eGFR so did not satisfy Medicare gap for kidney health evaluation.   Patient will see Dr Katrinka 08/22/2024 - placed noted in appointment notes to check UACR again (LAB 689) with needed additional labs- BMET or CMP LAB 15 or LAB 17.   Madelin Ray, PharmD Clinical Pharmacist [facility]  Population Health [phone]

## 2024-07-29 ENCOUNTER — Encounter: Payer: Self-pay | Admitting: Gastroenterology

## 2024-07-30 NOTE — Telephone Encounter (Signed)
Patient scheduled for next available ov with provider.

## 2024-07-31 DIAGNOSIS — K080 Exfoliation of teeth due to systemic causes: Secondary | ICD-10-CM | POA: Diagnosis not present

## 2024-08-05 ENCOUNTER — Ambulatory Visit (HOSPITAL_BASED_OUTPATIENT_CLINIC_OR_DEPARTMENT_OTHER): Admitting: Cardiology

## 2024-08-05 ENCOUNTER — Encounter (HOSPITAL_BASED_OUTPATIENT_CLINIC_OR_DEPARTMENT_OTHER): Payer: Self-pay | Admitting: Cardiology

## 2024-08-05 VITALS — BP 130/62 | HR 68 | Ht 66.0 in | Wt 175.0 lb

## 2024-08-05 DIAGNOSIS — I25118 Atherosclerotic heart disease of native coronary artery with other forms of angina pectoris: Secondary | ICD-10-CM

## 2024-08-05 DIAGNOSIS — R0609 Other forms of dyspnea: Secondary | ICD-10-CM

## 2024-08-05 DIAGNOSIS — E782 Mixed hyperlipidemia: Secondary | ICD-10-CM

## 2024-08-05 DIAGNOSIS — I5020 Unspecified systolic (congestive) heart failure: Secondary | ICD-10-CM

## 2024-08-05 DIAGNOSIS — Z789 Other specified health status: Secondary | ICD-10-CM

## 2024-08-05 DIAGNOSIS — E1159 Type 2 diabetes mellitus with other circulatory complications: Secondary | ICD-10-CM

## 2024-08-05 DIAGNOSIS — Z794 Long term (current) use of insulin: Secondary | ICD-10-CM

## 2024-08-05 NOTE — Progress Notes (Signed)
 Cardiology Office Note:  .    Date:  08/05/2024  ID:  Casey Lynch, DOB December 01, 1945, MRN [id number] PCP: Katrinka Garnette KIDD, MD  [facility] Providers Cardiologist:  Shelda Bruckner, MD     History of Present Illness: .    Casey Lynch is a 78 y.o. male with a hx of with CAD, chronic systolic and diastolic heart failure, ischemic cardiomyopathy, hyperlipidemia, PAD (carotid bruit <50% bilaterally, left subclavian steal syndrome), COPD, former tobacco abuse, OSA on CPAP, type II diabetes, here for follow up. I met him 07/11/2018 and his initial cardiac evaluation from [place]  is summarized in that note.   Today: Shortness of breath is a little shorter. Now gets short of breath up mild incline, has to slow down significantly. Even walking on level ground he has less exercise tolerance. Has been gradual decline over months. Has chest tightness at the same time as his shortness of breath. No wheezing/coughing/sputum. No PND or orthopnea.   He notes that lexiscan  previously made him feel like he was dying. Is unsure that he will be able to treadmill for any significant time. Discussed options, see below.  ROS:  Denies shortness of breath at rest. No PND, orthopnea, LE edema or unexpected weight gain. No syncope or palpitations. ROS otherwise negative except as noted.   Studies Reviewed: SABRA         Physical Exam:    VS:  BP 130/62 (BP Location: Right Arm, Patient Position: Sitting, Cuff Size: Normal)   Pulse 68   Ht 5' 6 (1.676 m)   Wt 175 lb (79.4 kg)   SpO2 95%   BMI 28.25 kg/m    Wt Readings from Last 3 Encounters:  08/05/24 175 lb (79.4 kg)  07/02/24 175 lb (79.4 kg)  03/05/24 172 lb 9.6 oz (78.3 kg)    GEN: Well nourished, well developed in no acute distress HEENT: Normal, moist mucous membranes NECK: No JVD CARDIAC: regular rhythm, normal S1 and S2, no rubs or gallops. No murmur. VASCULAR: Radial and DP pulses 2+ bilaterally. L carotid bruit RESPIRATORY:   Clear to auscultation without rales, wheezing or rhonchi  ABDOMEN: Soft, non-tender, non-distended MUSCULOSKELETAL:  Ambulates independently SKIN: Warm and dry, no edema NEUROLOGIC:  Alert and oriented x 3. No focal neuro deficits noted. PSYCHIATRIC:  Normal affect    ASSESSMENT AND PLAN: .    Dyspnea on exertion: -doesn't think he can treadmill for long, limited exercise capacity -has terrible feeling like I am going to die with lexiscan  -discussed options. Will start with echo. If unchanged, would hold metoprolol  for two days and try treadmill nuclear (despite concern for target HR). If echo worse than prior, then would get [facility] (prior cath in 2019 with severe pulmonary hypertension). If resting RHC normal would see if we could get exercise RHC -he will check his HR and O2 while walking and let me know.  -reviewed symptoms that need medical attention  I did consent him for right and left heart cath today, if needed: Informed Consent   Shared Decision Making/Informed Consent The risks [stroke (1 in 1000), death (1 in 1000), kidney failure [usually temporary] (1 in 500), bleeding (1 in 200), allergic reaction [possibly serious] (1 in 200)], benefits (diagnostic support and management of coronary artery disease) and alternatives of a cardiac catheterization were discussed in detail with Casey Lynch and he is willing to proceed.       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, with recovered EF:   -NYHA class II, EF improved to 50-55% on 08/2020 echo (was 40-45%) -tolerating maximum dose entresto , continue -tolerating metoprolol  succinate 50 mg daily for stable angina -did not tolerate spironolactone per report -did not tolerate jardiance ; now tolerating farxiga    CAD (predominantly RCA, but diffuse mild disease), with stable angina (though DOE may be worsening anginal equivalent): -tolerating metoprolol  succinate 50 mg  daily for stable angina -has not required nitroglycerin  but refilled today -on aspirin 81 mg -avoid long term NSAIDs given CAD -lipids as below -discussed red flag warning signs that need immediate medical attention, such as chest tightness that does not resolve with rest  PAD: with 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 Continue to monitor   Type II diabetes, on insulin  -has neuropathy and vascular disease as  diabetic complication -last A1c 7.5 -has had some weight gain on glargine -tolerating Farxiga  -had significant nausea/GERD on GLP1   Cardiac risk counseling and prevention recommendations: -recommend heart healthy/Mediterranean diet, with whole grains, fruits, vegetable, fish, lean meats, nuts, and olive oil. Limit salt. -recommend moderate walking, 3-5 times/week for 30-50 minutes each session. Aim for at least 150 minutes.week. Goal should be pace of 3 miles/hours, or walking 1.5 miles in 30 minutes -recommend avoidance of tobacco products. Avoid excess alcohol.  Dispo: Follow-up in 3 months, or sooner as needed.  Signed, Shelda Bruckner, MD

## 2024-08-05 NOTE — Patient Instructions (Addendum)
 Use pulse oximeter to measure heart rate and oxygen when you are walking. Send me a message with this.  Medication Instructions:  No changes *If you need a refill on your cardiac medications before your next appointment, please call your pharmacy*  Lab Work: none  Testing/Procedures: Your physician has requested that you have an echocardiogram. Echocardiography is a painless test that uses sound waves to create images of your heart. It provides your doctor with information about the size and shape of your heart and how well your heart's chambers and valves are working. This procedure takes approximately one hour. There are no restrictions for this procedure. Please do NOT wear cologne, perfume, aftershave, or lotions (deodorant is allowed). Please arrive 15 minutes prior to your appointment time.  Please note: We ask at that you not bring children with you during ultrasound (echo/ vascular) testing. Due to room size and safety concerns, children are not allowed in the ultrasound rooms during exams. Our front office staff cannot provide observation of children in our lobby area while testing is being conducted. An adult accompanying a patient to their appointment will only be allowed in the ultrasound room at the discretion of the ultrasound technician under special circumstances. We apologize for any inconvenience.   Follow-Up: At [facility], you and your health needs are our priority.  As part of our continuing mission to provide you with exceptional heart care, our providers are all part of one team.  This team includes your primary Cardiologist (physician) and Advanced Practice Providers or APPs (Physician Assistants and Nurse Practitioners) who all work together to provide you with the care you need, when you need it.  Your next appointment:   3 month(s)  Provider:   Shelda Bruckner, MD, Rosaline Bane, NP, or Reche Finder, NP

## 2024-08-09 ENCOUNTER — Other Ambulatory Visit: Payer: Self-pay | Admitting: Family Medicine

## 2024-08-15 NOTE — Progress Notes (Signed)
 Casey Lynch                                          MRN: [id number]   08/15/2024   The [facility] Quality Team Specialist reviewed this patient medical record for the purposes of chart review for care gap closure. The following were reviewed: abstraction for care gap closure-kidney health evaluation for diabetes:eGFR  and uACR.    [facility]I Quality Team

## 2024-08-22 ENCOUNTER — Encounter: Payer: Self-pay | Admitting: Family Medicine

## 2024-08-22 ENCOUNTER — Ambulatory Visit: Admitting: Family Medicine

## 2024-08-22 VITALS — BP 120/68 | HR 69 | Temp 98.0°F | Ht 66.0 in | Wt 171.6 lb

## 2024-08-22 DIAGNOSIS — E785 Hyperlipidemia, unspecified: Secondary | ICD-10-CM

## 2024-08-22 DIAGNOSIS — Z7984 Long term (current) use of oral hypoglycemic drugs: Secondary | ICD-10-CM

## 2024-08-22 DIAGNOSIS — Z79899 Other long term (current) drug therapy: Secondary | ICD-10-CM | POA: Diagnosis not present

## 2024-08-22 DIAGNOSIS — M1120 Other chondrocalcinosis, unspecified site: Secondary | ICD-10-CM | POA: Diagnosis not present

## 2024-08-22 DIAGNOSIS — E089 Diabetes mellitus due to underlying condition without complications: Secondary | ICD-10-CM | POA: Diagnosis not present

## 2024-08-22 DIAGNOSIS — E118 Type 2 diabetes mellitus with unspecified complications: Secondary | ICD-10-CM | POA: Diagnosis not present

## 2024-08-22 DIAGNOSIS — M1991 Primary osteoarthritis, unspecified site: Secondary | ICD-10-CM | POA: Diagnosis not present

## 2024-08-22 DIAGNOSIS — I2583 Coronary atherosclerosis due to lipid rich plaque: Secondary | ICD-10-CM

## 2024-08-22 DIAGNOSIS — E1169 Type 2 diabetes mellitus with other specified complication: Secondary | ICD-10-CM | POA: Diagnosis not present

## 2024-08-22 DIAGNOSIS — M0609 Rheumatoid arthritis without rheumatoid factor, multiple sites: Secondary | ICD-10-CM | POA: Diagnosis not present

## 2024-08-22 DIAGNOSIS — I2510 Atherosclerotic heart disease of native coronary artery without angina pectoris: Secondary | ICD-10-CM | POA: Diagnosis not present

## 2024-08-22 DIAGNOSIS — I5042 Chronic combined systolic (congestive) and diastolic (congestive) heart failure: Secondary | ICD-10-CM | POA: Diagnosis not present

## 2024-08-22 DIAGNOSIS — Z794 Long term (current) use of insulin: Secondary | ICD-10-CM | POA: Diagnosis not present

## 2024-08-22 LAB — LIPID PANEL
HDL: 41 (ref 35–70)
LDL Cholesterol: 48
Triglycerides: 230 — AB (ref 40–160)

## 2024-08-22 LAB — HEPATIC FUNCTION PANEL
ALT: 22 U/L (ref 10–40)
AST: 18 (ref 14–40)
Alkaline Phosphatase: 121 (ref 25–125)
Bilirubin, Total: 0.3

## 2024-08-22 LAB — BASIC METABOLIC PANEL WITH GFR
BUN: 18 (ref 4–21)
CO2: 26 — AB (ref 13–22)
Chloride: 100 (ref 99–108)
Creatinine: 1.4 — AB (ref 0.6–1.3)
Glucose: 183
Potassium: 5.3 meq/L — AB (ref 3.5–5.1)
Sodium: 139 (ref 137–147)

## 2024-08-22 LAB — CBC AND DIFFERENTIAL
HCT: 46 (ref 41–53)
Hemoglobin: 14.6 (ref 13.5–17.5)
Neutrophils Absolute: 6
Platelets: 314 K/uL (ref 150–400)
WBC: 9.2

## 2024-08-22 LAB — COMPREHENSIVE METABOLIC PANEL WITH GFR
Albumin: 4.2 (ref 3.5–5.0)
Calcium: 9.9 (ref 8.7–10.7)
eGFR: 52

## 2024-08-22 LAB — CBC: RBC: 4.98 (ref 3.87–5.11)

## 2024-08-22 NOTE — Progress Notes (Signed)
 Phone [phone] In person visit   Subjective:   Casey Lynch is a 78 y.o. 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;     Past Medical History-  Patient Active Problem List   Diagnosis Date Noted   Poorly controlled diabetes mellitus (HCC) 10/01/2020    Priority: High   Ischemic cardiomyopathy 07/25/2019    Priority: High   Chronic combined systolic and diastolic heart failure (HCC) 07/12/2018    Priority: High   Coronary artery disease due to lipid rich plaque 07/12/2018    Priority: High   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    Priority: Medium    Seronegative rheumatoid arthritis (HCC) 10/14/2021    Priority: Medium    Pseudogout involving multiple joints 10/01/2020    Priority: Medium    Mixed hyperlipidemia 07/12/2018    Priority: Medium    Statin intolerance 07/12/2018    Priority: Medium    Pulmonary HTN (HCC) 06/14/2018    Priority: Medium    COVID-19 07/13/2021    Priority: Low   Personal history of COVID-19 10/25/2020    Priority: Low   Pulmonary emphysema (HCC) 06/14/2018    Priority: Low   Type 2 diabetes mellitus with other circulatory complication, with long-term current use of insulin  (HCC) 09/18/2023    Medications- reviewed and updated Current Outpatient Medications  Medication Sig Dispense Refill   aspirin EC 81 MG tablet Take 81 mg by mouth daily. Swallow whole.     B Complex Vitamins (VITAMIN B COMPLEX) TABS Take by mouth.     Cholecalciferol (VITAMIN D3 PO) Take 1 capsule by mouth daily.     colchicine  0.6 MG tablet Take 1 tablet (0.6 mg total) by mouth daily. 90 tablet 0   Continuous Glucose Receiver (DEXCOM G7 RECEIVER) DEVI Place on arm to monitor blood sugars continually. Dx:  E11.9 1 each 3   Continuous Glucose Sensor (DEXCOM G7 SENSOR) MISC Apply sensor every 10 days. Pharmacy please provide 3 boxes (1 sensor per week) 9 each 3   CONTOUR NEXT TEST test strip      Evolocumab  (REPATHA  SURECLICK) 140 MG/ML SOAJ ADMINISTER 1 ML(140 MG) UNDER THE SKIN EVERY 14 DAYS 6 mL 3   FARXIGA  10 MG TABS tablet Take 1 tablet (10 mg total) by mouth daily before breakfast. 90 tablet 3   furosemide  (LASIX ) 20 MG tablet TAKE 1 TABLET BY MOUTH DAILY AS NEEDED FOR EDEMA 90 tablet 2   gabapentin  (NEURONTIN ) 100 MG capsule Take 1 capsule (100 mg total) by mouth 3 (three) times daily. 270 capsule 3   Glucagon  (BAQSIMI  ONE PACK) 3 MG/DOSE POWD Place 1 Device into the nose as needed (Low blood sugar with impaired consciousness). 2 each 3   hydroxychloroquine (PLAQUENIL) 200 MG tablet Take 200 mg by mouth 2 (two) times daily.     insulin  glargine, 2 Unit Dial, (TOUJEO  MAX SOLOSTAR) 300 UNIT/ML Solostar Pen Inject 24 Units into the skin daily. 12 mL 3   Insulin  Pen Needle 31G X 8 MM MISC Use to inject insulin  daily. Dx: E11.9 100 each 3   metFORMIN  (GLUCOPHAGE -XR) 500 MG 24 hr tablet TAKE 1 TABLET BY MOUTH TWICE DAILY WITH A MEAL 180 tablet 3   metoprolol  succinate (TOPROL -XL)  50 MG 24 hr tablet Take 1 tablet (50 mg total) by mouth daily. Take with or immediately following a meal. 90 tablet 3   nitroGLYCERIN  (NITROSTAT ) 0.4 MG SL tablet Place 1 tablet (0.4 mg total) under the tongue every 5 (five) minutes as needed for chest pain. 90 tablet 3   sacubitril -valsartan  (ENTRESTO ) 97-103 MG Take 1 tablet by mouth 2 (two) times daily. 180 tablet 3   triamcinolone  (KENALOG ) 0.1 % 1 application to affected area     No current facility-administered medications for this visit.     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 Nighttime is worse.   CBGs- has CONTINUOUS GLUCOSE MONITOR Dexcom with 90 day projected a1c of 7.9 but holidays tougher. No recent lows after decreasing insulin  Exercise and diet- golf yesterday- trying of the stay active and still walking weather peromitting Lab Results  Component Value Date   HGBA1C 7.5 (A) 07/02/2024   HGBA1C 8.0 (H) 12/25/2023   HGBA1C 8.7 08/24/2023    A/P: diabetes reasonable control- appears endocrinology ok with 8 or less- continue current medications and thankful no lows  Neuropathy still very bothersome- doing ok on 200 mg but wishes better- discussed going up on dose but he wants to hold steady  Microalbuminurai noted with UACR of 34- onentresto. Already on Farxiga  max dose. Could add Kerendia but we opted to recheck next visit and reconsider but he didn't ttoelrate spironolactone in the past so may not tolerate this. Controlling diabetes also helps  #CAD-follows with Dr. Edyth RCA but diffuse mild disease #Chronic systolic and diastolic heart failure related to ischemic cardiomyopathy in addition to nonischemic cardiomyopathy #PAD-carotid bruit and left subclavian steal syndrome #hyperlipidemia with statin intolerance S: Medication:Aspirin 81 mg -For lipids on Repatha , vascepa  too costly -For heart failure on Entresto  97-103 mg, metoprolol  extended release 50 mg, Lasix  20 mg daily as  needed- not needing in several months -Has not tolerated spironolactone in the past  No chest pain. Mild worsening shortness of breath of time- working with Dr. Lonni on this with upcoming echocardiogram. No swelling. Stable weight Lab Results  Component Value Date   CHOL 126 05/04/2023   HDL 42.50 05/04/2023   LDLCALC 31 12/24/2019   LDLDIRECT 64.0 05/04/2023   TRIG 287.0 (H) 05/04/2023   CHOLHDL 3 05/04/2023   A/P: CAD - asymptomatic as far as chest pain. Is getting shortness of breath worked up  CHF - euvolemic- continue current medications  Lipids-hopefully stable- update lipid panel today. Continue current meds for now  . Triglyceride(s) slightly high- can work on lifestyle but enjoys his sweets this time of year and sugar slightly high  #COPD/former smoker S: medication: none -graduated from lung  cancer screening at age 27 A/P: some shortness of breath but concerned it could be heart related- doing workup   #Seronegative RA per Dr. Ishmael- has bene told psoriatic arthritis S: on hydroxychloroquine 200mg   for possible psoriatic arthritis, on bioflex as well.  Follows with Dr. Ishmael A/P: has visit later today- continue to monitor    # psuedogout S:Colchicine  through Dr. Ishmael  with no flares -started on b complex as well- apparently had lows A/P: doing well with no flares- continue current medications     Recommended follow up: Return in about 6 months (around 02/20/2025) for physical or sooner if needed.Schedule b4 you leave. Future Appointments  Date Time Provider Department Center  09/03/2024  8:00 AM [facility] [place] VISIT 1 [facility]  09/11/2024 10:30 AM Mansouraty, Aloha Raddle., MD [facility]  09/13/2024 12:30 PM [facility] [id number]  09/30/2024  9:40 AM Obadiah Birmingham, MD [facility] None  11/07/2024  9:00 AM Lonni Slain, MD [facility] [phone] [facility]    Lab/Order associations:   ICD-10-CM   1. Type 2 diabetes mellitus  with unspecified complications (HCC)  E11.8     2. Current use of insulin  (HCC)  Z79.4     3. Long term current use of oral hypoglycemic drug  Z79.84     4. Hyperlipidemia associated with type 2 diabetes mellitus (HCC)  E11.69    E78.5     5. Chronic combined systolic and diastolic heart failure (HCC)  P49.57     6. Coronary artery disease due to lipid rich plaque  I25.10    I25.83       No orders of the defined types were placed in this encounter.   Return precautions advised.  Garnette Lukes, MD

## 2024-08-22 NOTE — Patient Instructions (Addendum)
 From my perspective you need CBC diff, CMP (BMP plus liver), and lipid panel under e78.5 hyperlipidemia - you wanted to hold off here since seeing Dr. Ishmael later today- we can also do this the opposite way meaning if she want s you to do her labs at our office you can reach back out to me. I'm happy to help either way.   Glad you are doing so well!   Recommended follow up: Return in about 6 months (around 02/20/2025) for physical or sooner if needed.Schedule b4 you leave.

## 2024-08-23 LAB — LAB REPORT - SCANNED
A1c: 7.8
EGFR: 52

## 2024-08-23 LAB — HEMOGLOBIN A1C: Hemoglobin A1C: 7.8

## 2024-08-30 ENCOUNTER — Other Ambulatory Visit: Payer: Self-pay | Admitting: Family Medicine

## 2024-09-02 ENCOUNTER — Other Ambulatory Visit: Payer: Self-pay | Admitting: Family Medicine

## 2024-09-02 ENCOUNTER — Encounter: Payer: Self-pay | Admitting: Family Medicine

## 2024-09-02 MED ORDER — GABAPENTIN 100 MG PO CAPS: 100.0000 mg | ORAL_CAPSULE | Freq: Three times a day (TID) | ORAL | 3 refills | Status: DC

## 2024-09-09 ENCOUNTER — Other Ambulatory Visit: Payer: Self-pay

## 2024-09-09 MED ORDER — GABAPENTIN 100 MG PO CAPS: 100.0000 mg | ORAL_CAPSULE | Freq: Three times a day (TID) | ORAL | 5 refills | Status: DC

## 2024-09-09 NOTE — Telephone Encounter (Signed)
Order pended and sent to you

## 2024-09-10 MED ORDER — GABAPENTIN 100 MG PO CAPS: 100.0000 mg | ORAL_CAPSULE | Freq: Three times a day (TID) | ORAL | 0 refills | Status: DC

## 2024-09-11 ENCOUNTER — Encounter: Payer: Self-pay | Admitting: Gastroenterology

## 2024-09-11 ENCOUNTER — Encounter: Payer: Self-pay | Admitting: Family Medicine

## 2024-09-11 ENCOUNTER — Ambulatory Visit: Admitting: Gastroenterology

## 2024-09-11 VITALS — BP 116/64 | HR 80 | Ht 66.0 in | Wt 173.5 lb

## 2024-09-11 DIAGNOSIS — Z860100 Personal history of colon polyps, unspecified: Secondary | ICD-10-CM

## 2024-09-11 DIAGNOSIS — Z9889 Other specified postprocedural states: Secondary | ICD-10-CM

## 2024-09-11 DIAGNOSIS — D132 Benign neoplasm of duodenum: Secondary | ICD-10-CM

## 2024-09-11 DIAGNOSIS — D135 Benign neoplasm of extrahepatic bile ducts: Secondary | ICD-10-CM | POA: Diagnosis not present

## 2024-09-11 NOTE — Progress Notes (Signed)
 "  GASTROENTEROLOGY OUTPATIENT CLINIC VISIT   Primary Care Provider Katrinka Garnette KIDD, MD [address] [place] [address] [phone]  Referring Provider Katrinka Garnette KIDD, MD [address] [place],  [place] [address] [phone]  Patient Profile: Casey Lynch is a 79 y.o. 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  The patient presents to the [facility] for an evaluation and management of problem(s) noted below:  Problem List 1. Ampullary adenoma   2. Adenomatous duodenal polyp   3. History of ERCP   4. History of colonic polyps    Discussed the use of AI scribe software for clinical note transcription with the patient, who gave verbal consent to proceed.  History of Present Illness This is the patient's first visit to the [facility] clinic.  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  He is accompanied by his wife today.  Initially diagnosed with an ampullary adenoma in 2020 by Dr. Saintclair and then being referred to Dr. Justus Medal at [facility].  He has undergone multiple endoscopic resections via ampullectomy requiring ERCP and EGD resections for recurrent lesions.  Most recently in 2025, he had a lesion noted on duodenoscopy inferior to his biliary ducts that was sampled/removed and returned as tubular adenoma.  He previously discussed surgical options in 2023 due to the recurrences with dr. Lupe at [facility], but as this included needing a Whipple procedure he and wife declined at that time and remain uninterested in surgery if possible.  He does not describe any history of post-procedural complications (other than on one ablation where he  noticed some degree of discomfort but never pancreatitis or perforation).  Peri-procedural rectal indomethacin suppositories have been used to reduce pancreatitis risk, though insurance cost has been a barrier. He and his wife have found being prescribed the NSAID was more affordable.  8/25 MRCP showed no bile duct dilation or pancreatic duct dilation. He reports recent laboratory testing that has been normal though we do not have access to see it.  No other significant GI issues at this time.   GI Review of Systems Positive as above Negative for dysphagia, odynophagia, nausea, vomiting, pain, change in bowel habits, melena, hematochezia  Review of Systems General: Denies fevers/chills/weight loss unintentionally Cardiovascular: Denies chest pain Pulmonary: Denies shortness of breath Gastroenterological: See HPI Genitourinary: Denies darkened urine Hematological: Denies easy bruising/bleeding Dermatological: Denies jaundice Psychological: Mood is stable  Medications Current Outpatient Medications  Medication Sig Dispense Refill   aspirin EC 81 MG tablet Take 81 mg by mouth daily. Swallow whole.     B Complex Vitamins (VITAMIN B COMPLEX) TABS Take by mouth.     Cholecalciferol (VITAMIN D3 PO) Take 1 capsule by mouth daily.     colchicine  0.6 MG tablet Take 1 tablet (0.6 mg total) by mouth daily. 90 tablet 0   Continuous Glucose Receiver (DEXCOM G7 RECEIVER) DEVI Place on arm to monitor blood sugars continually. Dx: E11.9 1 each 3   Continuous Glucose Sensor (DEXCOM G7 SENSOR) MISC Apply sensor every 10 days. Pharmacy please provide 3 boxes (1 sensor per week) 9 each 3   Evolocumab  (REPATHA  SURECLICK) 140 MG/ML SOAJ ADMINISTER 1 ML(140 MG) UNDER THE SKIN EVERY 14 DAYS 6 mL 3   FARXIGA   10 MG TABS tablet TAKE 1 TABLET BY MOUTH DAILY BEFORE BREAKFAST 90 tablet 3   furosemide  (LASIX ) 20 MG tablet TAKE 1 TABLET BY MOUTH DAILY AS NEEDED FOR EDEMA 90 tablet 2   gabapentin  (NEURONTIN ) 100 MG  capsule Take 1 capsule (100 mg total) by mouth 3 (three) times daily. 270 capsule 5   Glucagon  (BAQSIMI  ONE PACK) 3 MG/DOSE POWD Place 1 Device into the nose as needed (Low blood sugar with impaired consciousness). 2 each 3   hydroxychloroquine (PLAQUENIL) 200 MG tablet Take 200 mg by mouth 2 (two) times daily.     insulin  glargine, 2 Unit Dial, (TOUJEO  MAX SOLOSTAR) 300 UNIT/ML Solostar Pen Inject 24 Units into the skin daily. 12 mL 3   Insulin  Pen Needle 31G X 8 MM MISC Use to inject insulin  daily. Dx: E11.9 100 each 3   metFORMIN  (GLUCOPHAGE -XR) 500 MG 24 hr tablet TAKE 1 TABLET BY MOUTH TWICE DAILY WITH A MEAL (Patient taking differently: Take 500 mg by mouth daily.) 180 tablet 3   metoprolol  succinate (TOPROL -XL) 50 MG 24 hr tablet Take 1 tablet (50 mg total) by mouth daily. Take with or immediately following a meal. 90 tablet 3   nitroGLYCERIN  (NITROSTAT ) 0.4 MG SL tablet Place 1 tablet (0.4 mg total) under the tongue every 5 (five) minutes as needed for chest pain. 90 tablet 3   sacubitril -valsartan  (ENTRESTO ) 97-103 MG Take 1 tablet by mouth 2 (two) times daily. 180 tablet 3   triamcinolone  (KENALOG ) 0.1 % 1 application to affected area     CONTOUR NEXT TEST test strip  (Patient not taking: Reported on 09/11/2024)     gabapentin  (NEURONTIN ) 100 MG capsule Take 1 capsule (100 mg total) by mouth 3 (three) times daily. 15 capsule 0   No current facility-administered medications for this visit.    Allergies Allergies[1]  Histories Past Medical History:  Diagnosis Date   Carotid bruit    Diabetes mellitus without complication (HCC)    Eczema    Heart failure (HCC)    History of colonic polyps    Hypercholesterolemia    Hyperlipidemia    Macular degeneration    Osteoarthritis    Paresthesias    Pseudogout    Subclavian steal syndrome    Past Surgical History:  Procedure Laterality Date   CARDIAC CATHETERIZATION     CATARACT EXTRACTION Bilateral    jan and feb 2025    COLONOSCOPY WITH PROPOFOL  N/A 10/24/2016   Procedure: COLONOSCOPY WITH PROPOFOL ;  Surgeon: Gladis MARLA Louder, MD;  Location: [facility];  Service: Endoscopy;  Laterality: N/A;   HYDROCELE EXCISION / REPAIR     KNEE SURGERY     REFRACTIVE SURGERY     TONSILLECTOMY     Social History   Socioeconomic History   Marital status: Married    Spouse name: Not on file   Number of children: Not on file   Years of education: Not on file   Highest education level: Professional school degree (e.g., MD, DDS, DVM, JD)  Occupational History   Not on file  Tobacco Use   Smoking status: Former    Current packs/day: 0.00    Average packs/day: 1 pack/day for 59.0 years (59.0 ttl pk-yrs)    Types: Cigarettes    Start date: 09/05/1957    Quit date: 09/05/2016    Years since quitting: 8.0   Smokeless tobacco: Never  Vaping Use   Vaping status: Never Used  Substance and Sexual Activity   Alcohol  use: Not Currently    Comment: Rare   Drug use: No   Sexual activity: Yes  Other Topics Concern   Not on file  Social History Narrative   Married. 2 children. 3 grandkids.       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    - congregational [employer]      Hobbies: golf, woodworking, equities trader for fly fishing   Social Drivers of Health   Tobacco Use: Medium Risk (09/12/2024)   Patient History    Smoking Tobacco Use: Former    Smokeless Tobacco Use: Never    Passive Exposure: Not on Actuary Strain: Low Risk (08/18/2024)   Overall Financial Resource Strain (CARDIA)    Difficulty of Paying Living Expenses: Not hard at all  Food Insecurity: No Food Insecurity (08/18/2024)   Epic    Worried About Programme Researcher, Broadcasting/film/video in the Last Year: Never true    Ran Out of Food in the Last Year: Never true  Transportation Needs: No Transportation Needs (08/18/2024)   Epic    Lack of Transportation (Medical): No    Lack of Transportation (Non-Medical): No  Physical  Activity: Sufficiently Active (08/18/2024)   Exercise Vital Sign    Days of Exercise per Week: 4 days    Minutes of Exercise per Session: 50 min  Stress: No Stress Concern Present (08/18/2024)   [employer] of Occupational Health - Occupational Stress Questionnaire    Feeling of Stress: Not at all  Social Connections: Socially Integrated (08/18/2024)   Social Connection and Isolation Panel    Frequency of Communication with Friends and Family: More than three times a week    Frequency of Social Gatherings with Friends and Family: More than three times a week    Attends Religious Services: More than 4 times per year    Active Member of [employer] or Organizations: Yes    Attends Banker Meetings: More than 4 times per year    Marital Status: Married  Catering Manager Violence: Not At Risk (08/28/2023)   Humiliation, Afraid, Rape, and Kick questionnaire    Fear of Current or Ex-Partner: No    Emotionally Abused: No    Physically Abused: No    Sexually Abused: No  Depression (PHQ2-9): Low Risk (08/22/2024)   Depression (PHQ2-9)    PHQ-2 Score: 2  Alcohol Screen: Low Risk (08/18/2024)   Alcohol Screen    Last Alcohol Screening Score (AUDIT): 1  Housing: Low Risk (08/18/2024)   Epic    Unable to Pay for Housing in the Last Year: No    Number of Times Moved in the Last Year: 0    Homeless in the Last Year: No  Utilities: Not At Risk (08/28/2023)   AHC Utilities    Threatened with loss of utilities: No  Health Literacy: Adequate Health Literacy (08/28/2023)   B1300 Health Literacy    Frequency of need for help with medical instructions: Never   Family History  Problem Relation Age of Onset   Heart failure Father    Bipolar disorder Brother    Diabetes Brother    Cancer Paternal Grandmother    Colon cancer Neg Hx    Esophageal cancer Neg Hx    Inflammatory bowel disease Neg Hx    Liver disease Neg Hx    Pancreatic cancer Neg Hx    Rectal cancer Neg Hx     Stomach cancer Neg Hx  I have reviewed his medical, social, and family history in detail and updated the electronic medical record as necessary.    PHYSICAL EXAMINATION  BP 116/64   Pulse 80   Ht 5' 6 (1.676 m)   Wt 173 lb 8 oz (78.7 kg)   BMI 28.00 kg/m  Wt Readings from Last 3 Encounters:  09/11/24 173 lb 8 oz (78.7 kg)  08/22/24 171 lb 9.6 oz (77.8 kg)  08/05/24 175 lb (79.4 kg)  GEN: NAD, appears stated age, doesn't appear chronically ill PSYCH: Cooperative, without pressured speech EYE: Conjunctivae pink, sclerae anicteric ENT: MMM CV: Nontachycardic RESP: No audible wheezing GI: NABS, soft, NT/ND, without rebound or guarding MSK/EXT: No significant lower extremity edema SKIN: No jaundice NEURO:  Alert & Oriented x 3, no focal deficits  REVIEW OF DATA  I reviewed the following data at the time of this encounter:  GI Procedures and Studies  EGD (for dysphagia) 03/04/2019 showed normal esophagus (biopsies negative for EoE), non-bleeding erosive gastropathy (biopsies negative for H pylori) and polypoid mucosal changes of the ampulla (duodenal adenoma) and in two separate areas across the ampulla (duodenal adenoma).    ERCP 03/28/19 w/ ampullary adenoma with extension along the medial duodenal wall - s/p papillectomy (path - TA with no high-grade dysplasia), dual sphincterotomies and PD stent placement.    ERCP 05/09/19 - removal of PD stent, normal cholangiogram, ampullary biopsies, two duodenal polyps along lateral duodenal wall, successful EMR performed (path ampulla: tubular adenoma, neg high grade dysplasia, duodenal polyps: tubular adenomas, neg for high grade dysplasia)    ERCP 08/12/19 w/ normal cholangiogram, ampullary biopsies path w/ TA, neg for HGD or malignancy, islands of polypoid mucosa seen at the prior EMR site s/p piecemeal polypectomy was performed path w/ Brunner's gland hyperplasia, negative for dysplasia and malignancy.    ERCP 11/07/19:  Evidence of prior EMR was seen in the second portion of the duodenum. Abnormal mucosa (3-5 mm) was seen at the ampulla. Excisional biopsies were obtained. No residual abnormal tissue was seen. Pathology: Fragments of tubular adenoma with chronic inflammation. Negative for high grade dysplasia.    EGD 06/22/20: limited views of esophagus and stomach were normal. Abnormal mucosa (5 mm) was seen at the ampulla s/p bx. No residual abnormal tissue was seen. Path with ampulla adenoma, low grade dysplasia, negative for HGD   ERCP 11/06/20: Abnormal adenomatous tissue at the ampulla treated with APC therapy. Prophylactic 10 Fr by 7 cm CBD and 5 Fr by 4 cm PD stents were placed.      ERCP 11/30/20: Removal of CBD and PD stents.  Abnormal appearing tissue was seen at the ampulla (<5 mm) which was biopsied with cold forceps. Path: Tubular adenoma.    EGD 12/11/19: Recurrent ampullary adenoma extending inferiorly along the duodenal wall. The polyp on the duodenal wall was removed with endoscopic mucosal resection. The ampullary polyp was not removed on today's exam.   Path: TA w/out high grade dysplasia  Office visit Dr. Debora 01/18/22:  Discussed options with patient who elected for endoscopic resection  ERCP 01/28/22: Recurrent ampullary adenoma s/p endoscopic papillectomy, PD stent placement, and APC ablation  11/17/22 EGD: A 5 mm polyp was seen adjacent to the papillectomy site. This was removed using a cold snare. There was another 3 mm polyp seen inferior to the papillectomy site, removed using a cold snare. This specimen could not be retrieved.   4/25 EGD Impression A 5 mm polyp was seen adjacent to the papillectomy site.  This was removed using biopsy forceps There was another 3 mm polyp seen inferior to the papillectomy site, removed using biopsy forceps.  Final Diagnosis   A. AMPULLA, BIOPSY:               Ampullary adenoma (low grade dysplasia).               No high grade dysplasia  or malignancy identified.   Laboratory Studies  Reviewed those in epic  Imaging Studies  August 2025 MRCP outside [facility] IMPRESSION:  No discrete ampullary mass is visualized. However, reported small size of the ERCP finding of concern may be below the resolution of MR in the ampullary/periampullary location.    ASSESSMENT/PLAN  Mr. Shippy is a 79 y.o. male.  The patient is seen today for evaluation and management of:  1. Ampullary adenoma   2. Adenomatous duodenal polyp   3. History of ERCP   4. History of colonic polyps    The patient is clinically and hemodynamically stable at this time.   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 He remains at high risk for further recurrence given his history, but there is no current evidence of bile duct involvement or complications from prior MRI/MRCP.  The patient and wife continue to prefer endoscopic surveillance over surgical intervention.  Procedural risks--including pancreatitis, perforation, bleeding, and infection, and aspiration, and death--were discussed, along with risk mitigation strategies. - Scheduled ERCP as able to assess for recurrence and perform intervention as indicated. - Prepared to perform polypectomy and/or ablation if significant recurrence is identified. - Place pancreatic or biliary stents intra-procedurally if tissue is close to the orifices to protect the ducts. - Administer rectal NSAID during procedure to reduce risk of post-ERCP pancreatitis. - Obtain recent liver function tests from [facility] or order new labs if unavailable after review. - Conditional plan for surgical referral if high-grade dysplasia or malignancy is found or if endoscopic management becomes unfeasible due to tissue ingrowth into the duct.   We discussed some of the techniques of endoscopic  ampullectomy.  The risks of an ERCP were discussed at length, including but not limited to the risk of perforation, bleeding, abdominal pain, post-ERCP pancreatitis (while usually mild can be severe and even life threatening).  My approach is such that we attempt both pancreatic duct and biliary duct cannulation if possible with small sphincterotomies to hopefully I have increased chance of access post ampullectomy of stent placement.  If unsuccessful cannulation then proceed with ampullectomy with hope that we are able to find the pancreatic orifice and biliary orifice post procedure with rectal indomethacin no matter what given to decrease risk of post ERCP pancreatitis.  During attempts at endoscopic ampullectomy, the risks of bleeding and perforation/leak and pancreatitis are increased as opposed to normal ERCPs, and that was discussed with the patient as well.    Subsequent short-interval endoscopic evaluation for follow up and potential retreatment of the lesion/area may be necessary.  If, after endoscopic attempt at removal of the polyp, it is found that the patient has a complication or that an invasive lesion or malignant lesion is found, or that the polyp continues to recur, the patient is aware and understands that surgery may still be indicated/required.  All patient questions were answered, to the best of my ability, and the patient agrees to the aforementioned plan of action with follow-up as indicated.  Colon cancer screening and colon polyp surveillance - Try to obtain [facility] records to know when he may be due   Orders Placed This Encounter  Procedures   Procedural/ Surgical Case Request: ERCP, WITH INTERVENTION IF INDICATED   CBC   Comp Met (CMET)   Ambulatory referral to Gastroenterology    New Prescriptions   No medications on file   Modified Medications   No medications on file    Planned Follow Up No follow-ups on file.   Total Time in Face-to-Face and in Coordination  of Care for patient including independent/personal interpretation/review of prior testing, medical history, examination, medication adjustment, communicating results with the patient directly, and documentation within the EHR is 45 minutes.   Aloha Finner, MD [facility] Office # [phone]     [1]  Allergies Allergen Reactions   Jardiance  [Empagliflozin ] Other (See Comments)    Pain and stiffness   Statins Other (See Comments)    Terrible joint pain   Tetanus Toxoid-Containing Vaccines Other (See Comments)    Fever 104/105 & arm stiffiness   "

## 2024-09-11 NOTE — Patient Instructions (Addendum)
 Your provider has requested that you go to the basement level for lab work before leaving today. Press B on the elevator. The lab is located at the first door on the left as you exit the elevator. ( If were are not able to obtain your labs from [facility] , please come to our lab for your lab work. )   You have been scheduled for an endoscopy. Please follow written instructions given to you at your visit today.  If you use inhalers (even only as needed), please bring them with you on the day of your procedure.  If you take any of the following medications, they will need to be adjusted prior to your procedure:   DO NOT TAKE 7 DAYS PRIOR TO TEST- Trulicity  (dulaglutide ) Ozempic , Wegovy (semaglutide ) Mounjaro, Zepbound (tirzepatide) Bydureon Bcise (exanatide extended release)  DO NOT TAKE 1 DAY PRIOR TO YOUR TEST Rybelsus (semaglutide ) Adlyxin (lixisenatide) Victoza (liraglutide) Byetta (exanatide) ___________________________________________________________________________   Due to recent changes in healthcare laws, you may see the results of your imaging and laboratory studies on MyChart before your provider has had a chance to review them.  We understand that in some cases there may be results that are confusing or concerning to you. Not all laboratory results come back in the same time frame and the provider may be waiting for multiple results in order to interpret others.  Please give us  48 hours in order for your provider to thoroughly review all the results before contacting the office for clarification of your results.   Thank you for choosing me and [facility].  Dr. Wilhelmenia

## 2024-09-12 ENCOUNTER — Other Ambulatory Visit (HOSPITAL_BASED_OUTPATIENT_CLINIC_OR_DEPARTMENT_OTHER)

## 2024-09-12 ENCOUNTER — Encounter: Payer: Self-pay | Admitting: Gastroenterology

## 2024-09-12 DIAGNOSIS — Z9889 Other specified postprocedural states: Secondary | ICD-10-CM | POA: Insufficient documentation

## 2024-09-12 DIAGNOSIS — D132 Benign neoplasm of duodenum: Secondary | ICD-10-CM | POA: Insufficient documentation

## 2024-09-12 DIAGNOSIS — Z860100 Personal history of colon polyps, unspecified: Secondary | ICD-10-CM | POA: Insufficient documentation

## 2024-09-12 DIAGNOSIS — D135 Benign neoplasm of extrahepatic bile ducts: Secondary | ICD-10-CM | POA: Insufficient documentation

## 2024-09-13 ENCOUNTER — Other Ambulatory Visit (HOSPITAL_BASED_OUTPATIENT_CLINIC_OR_DEPARTMENT_OTHER)

## 2024-09-16 ENCOUNTER — Telehealth: Payer: Self-pay | Admitting: Family Medicine

## 2024-09-16 ENCOUNTER — Telehealth (HOSPITAL_BASED_OUTPATIENT_CLINIC_OR_DEPARTMENT_OTHER): Payer: Self-pay | Admitting: Cardiology

## 2024-09-16 ENCOUNTER — Encounter: Payer: Self-pay | Admitting: Family Medicine

## 2024-09-16 NOTE — Telephone Encounter (Signed)
 Pt dropped off lab work from 08/22/2024 that was taken at [facility]. Copy will be in providers box. Has also been sent to medical records to put into chart.

## 2024-09-16 NOTE — Telephone Encounter (Signed)
 Patient's spouse came in and presented lab results from Dr. Kathlene rheumatology office. Patient's spouse is requesting that PCP review labs. I took a copy of labs and have placed them into PCP box.

## 2024-09-16 NOTE — Telephone Encounter (Signed)
 Labs abstracted into patient chart

## 2024-09-17 ENCOUNTER — Encounter: Payer: Self-pay | Admitting: Gastroenterology

## 2024-09-24 ENCOUNTER — Ambulatory Visit

## 2024-09-24 VITALS — Ht 66.5 in | Wt 173.0 lb

## 2024-09-24 DIAGNOSIS — Z0000 Encounter for general adult medical examination without abnormal findings: Secondary | ICD-10-CM | POA: Diagnosis not present

## 2024-09-24 NOTE — Progress Notes (Signed)
 "  Chief Complaint  Patient presents with   Medicare Wellness     Subjective:   Casey Lynch is a 79 y.o. male who presents for a Medicare Annual Wellness Visit.  Visit info / Clinical Intake: Medicare Wellness Visit Type:: Subsequent Annual Wellness Visit Persons participating in visit and providing information:: patient Medicare Wellness Visit Mode:: Video Since this visit was completed virtually, some vitals may be partially provided or unavailable. Missing vitals are due to the limitations of the virtual format.: Unable to obtain vitals - no equipment If Telephone or Video please confirm:: I connected with patient using audio/video enable telemedicine. I verified patient identity with two identifiers, discussed telehealth limitations, and patient agreed to proceed. Patient Location:: home Provider Location:: office Interpreter Needed?: No Pre-visit prep was completed: yes AWV questionnaire completed by patient prior to visit?: yes Date:: 09/20/24 Living arrangements:: (Proxy-Rptd) lives with spouse/significant other Patient's Overall Health Status Rating: (Proxy-Rptd) good Typical amount of pain: (Proxy-Rptd) some Does pain affect daily life?: (Proxy-Rptd) no Are you currently prescribed opioids?: no  Dietary Habits and Nutritional Risks How many meals a day?: (Proxy-Rptd) 3 Eats fruit and vegetables daily?: (Proxy-Rptd) yes Most meals are obtained by: (Proxy-Rptd) preparing own meals In the last 2 weeks, have you had any of the following?: none Diabetic:: (!) yes Any non-healing wounds?: no How often do you check your BS?: continuous glucose monitor Would you like to be referred to a Nutritionist or for Diabetic Management? : no  Functional Status Activities of Daily Living (to include ambulation/medication): (Proxy-Rptd) Independent Ambulation: Independent with device- listed below Home Assistive Devices/Equipment: Eyeglasses Medication Administration: (Proxy-Rptd)  Independent Home Management (perform basic housework or laundry): (Proxy-Rptd) Independent Manage your own finances?: (Proxy-Rptd) yes Primary transportation is: (Proxy-Rptd) driving Concerns about vision?: no *vision screening is required for WTM* Concerns about hearing?: no  Fall Screening Falls in the past year?: (Proxy-Rptd) 0 Number of falls in past year: 0 Was there an injury with Fall?: 0 Fall Risk Category Calculator: 0 Patient Fall Risk Level: Low Fall Risk  Fall Risk Patient at Risk for Falls Due to: No Fall Risks Fall risk Follow up: Falls evaluation completed  Home and Transportation Safety: All rugs have non-skid backing?: (Proxy-Rptd) yes All stairs or steps have railings?: (Proxy-Rptd) yes Grab bars in the bathtub or shower?: (Proxy-Rptd) yes Have non-skid surface in bathtub or shower?: (Proxy-Rptd) yes Good home lighting?: (Proxy-Rptd) yes Regular seat belt use?: (Proxy-Rptd) yes Hospital stays in the last year:: (Proxy-Rptd) no  Cognitive Assessment Difficulty concentrating, remembering, or making decisions? : (Proxy-Rptd) no Will 6CIT or Mini Cog be Completed: yes What year is it?: 0 points What month is it?: 0 points Give patient an address phrase to remember (5 components): [address] [place]  About what time is it?: 0 points Count backwards from 20 to 1: 0 points Say the months of the year in reverse: 0 points Repeat the address phrase from earlier: 0 points 6 CIT Score: 0 points  Advance Directives (For Healthcare) Does Patient Have a Medical Advance Directive?: Yes Type of Advance Directive: Healthcare Power of Attorney (pt stated his wife will follow up with that) Copy of Healthcare Power of Attorney in Chart?: Yes - validated most recent copy scanned in chart (See row information)  Reviewed/Updated  Reviewed/Updated: Reviewed All (Medical, Surgical, Family, Medications, Allergies, Care Teams, Patient Goals)    Allergies  (verified) Jardiance  [empagliflozin ], Statins, and Tetanus toxoid-containing vaccines   Current Medications (verified) Outpatient Encounter Medications as  of 09/24/2024  Medication Sig   aspirin EC 81 MG tablet Take 81 mg by mouth daily. Swallow whole.   B Complex Vitamins (VITAMIN B COMPLEX) TABS Take by mouth.   Cholecalciferol (VITAMIN D3 PO) Take 1 capsule by mouth daily.   colchicine  0.6 MG tablet Take 1 tablet (0.6 mg total) by mouth daily.   Continuous Glucose Receiver (DEXCOM G7 RECEIVER) DEVI Place on arm to monitor blood sugars continually. Dx: E11.9   Continuous Glucose Sensor (DEXCOM G7 SENSOR) MISC Apply sensor every 10 days. Pharmacy please provide 3 boxes (1 sensor per week)   Evolocumab  (REPATHA  SURECLICK) 140 MG/ML SOAJ ADMINISTER 1 ML(140 MG) UNDER THE SKIN EVERY 14 DAYS   FARXIGA  10 MG TABS tablet TAKE 1 TABLET BY MOUTH DAILY BEFORE BREAKFAST   furosemide  (LASIX ) 20 MG tablet TAKE 1 TABLET BY MOUTH DAILY AS NEEDED FOR EDEMA   gabapentin  (NEURONTIN ) 100 MG capsule Take 1 capsule (100 mg total) by mouth 3 (three) times daily.   Glucagon  (BAQSIMI  ONE PACK) 3 MG/DOSE POWD Place 1 Device into the nose as needed (Low blood sugar with impaired consciousness).   hydroxychloroquine (PLAQUENIL) 200 MG tablet Take 200 mg by mouth 2 (two) times daily.   insulin  glargine, 2 Unit Dial, (TOUJEO  MAX SOLOSTAR) 300 UNIT/ML Solostar Pen Inject 24 Units into the skin daily.   Insulin  Pen Needle 31G X 8 MM MISC Use to inject insulin  daily. Dx: E11.9   metFORMIN  (GLUCOPHAGE -XR) 500 MG 24 hr tablet TAKE 1 TABLET BY MOUTH TWICE DAILY WITH A MEAL   metoprolol  succinate (TOPROL -XL) 50 MG 24 hr tablet Take 1 tablet (50 mg total) by mouth daily. Take with or immediately following a meal.   nitroGLYCERIN  (NITROSTAT ) 0.4 MG SL tablet Place 1 tablet (0.4 mg total) under the tongue every 5 (five) minutes as needed for chest pain.   sacubitril -valsartan  (ENTRESTO ) 97-103 MG Take 1 tablet by mouth 2 (two)  times daily.   triamcinolone  (KENALOG ) 0.1 % 1 application to affected area   No facility-administered encounter medications on file as of 09/24/2024.    History: Past Medical History:  Diagnosis Date   Carotid bruit    Diabetes mellitus without complication (HCC)    Eczema    Heart failure (HCC)    History of colonic polyps    Hypercholesterolemia    Hyperlipidemia    Macular degeneration    Osteoarthritis    Paresthesias    Pseudogout    Subclavian steal syndrome    Past Surgical History:  Procedure Laterality Date   CARDIAC CATHETERIZATION     CATARACT EXTRACTION Bilateral    jan and feb 2025   COLONOSCOPY WITH PROPOFOL  N/A 10/24/2016   Procedure: COLONOSCOPY WITH PROPOFOL ;  Surgeon: Gladis MARLA Louder, MD;  Location: [facility];  Service: Endoscopy;  Laterality: N/A;   HYDROCELE EXCISION / REPAIR     KNEE SURGERY     REFRACTIVE SURGERY     TONSILLECTOMY     Family History  Problem Relation Age of Onset   Heart failure Father    Bipolar disorder Brother    Diabetes Brother    Cancer Paternal Grandmother    Colon cancer Neg Hx    Esophageal cancer Neg Hx    Inflammatory bowel disease Neg Hx    Liver disease Neg Hx    Pancreatic cancer Neg Hx    Rectal cancer Neg Hx    Stomach cancer Neg Hx    Social History   Occupational History  Not on file  Tobacco Use   Smoking status: Former    Current packs/day: 0.00    Average packs/day: 1 pack/day for 59.0 years (59.0 ttl pk-yrs)    Types: Cigarettes    Start date: 09/05/1957    Quit date: 09/05/2016    Years since quitting: 8.0   Smokeless tobacco: Never  Vaping Use   Vaping status: Never Used  Substance and Sexual Activity   Alcohol use: Not Currently    Comment: Rare   Drug use: No   Sexual activity: Yes   Tobacco Counseling Counseling given: Not Answered  SDOH Screenings   Food Insecurity: No Food Insecurity (09/24/2024)  Housing: Low Risk (09/24/2024)  Transportation Needs: No Transportation Needs  (09/24/2024)  Utilities: Not At Risk (09/24/2024)  Alcohol Screen: Low Risk (08/18/2024)  Depression (PHQ2-9): Low Risk (09/24/2024)  Financial Resource Strain: Low Risk (08/18/2024)  Physical Activity: Sufficiently Active (09/24/2024)  Social Connections: Moderately Integrated (09/24/2024)  Stress: No Stress Concern Present (09/24/2024)  Tobacco Use: Medium Risk (09/24/2024)  Health Literacy: Adequate Health Literacy (09/24/2024)   See flowsheets for full screening details  Depression Screen PHQ 2 & 9 Depression Scale- Over the past 2 weeks, how often have you been bothered by any of the following problems? Little interest or pleasure in doing things: 0 Feeling down, depressed, or hopeless (PHQ Adolescent also includes...irritable): 1 (loss brother recently) PHQ-2 Total Score: 1 Trouble falling or staying asleep, or sleeping too much: 0 Feeling tired or having little energy: 0 Poor appetite or overeating (PHQ Adolescent also includes...weight loss): 0 Feeling bad about yourself - or that you are a failure or have let yourself or your family down: 0 Trouble concentrating on things, such as reading the newspaper or watching television (PHQ Adolescent also includes...like school work): 0 Moving or speaking so slowly that other people could have noticed. Or the opposite - being so fidgety or restless that you have been moving around a lot more than usual: 0 Thoughts that you would be better off dead, or of hurting yourself in some way: 0 PHQ-9 Total Score: 1 If you checked off any problems, how difficult have these problems made it for you to do your work, take care of things at home, or get along with other people?: Not difficult at all     Goals Addressed               This Visit's Progress     maintain health and activity (pt-stated)        Maintain health and activity              Objective:    Today's Vitals   09/24/24 1523  Weight: 173 lb (78.5 kg)  Height: 5' 6.5  (1.689 m)   Body mass index is 27.5 kg/m.  Hearing/Vision screen Hearing Screening - Comments:: Pt denies any hearing issues  Vision Screening - Comments:: Wears rx glasses - up to date with routine eye exams with Dr Cleatus  Immunizations and Health Maintenance Health Maintenance  Topic Date Due   COVID-19 Vaccine (9 - Mixed Product risk 2025-26 season) 11/14/2024   Diabetic kidney evaluation - Urine ACR  12/31/2024   FOOT EXAM  02/18/2025   HEMOGLOBIN A1C  02/21/2025   OPHTHALMOLOGY EXAM  05/23/2025   Diabetic kidney evaluation - eGFR measurement  08/22/2025   Medicare Annual Wellness (AWV)  09/24/2025   Influenza Vaccine  Completed   Hepatitis C Screening  Completed   Zoster Vaccines- Shingrix  Completed   Meningococcal B Vaccine  Aged Out   Lung Cancer Screening  Discontinued   DTaP/Tdap/Td  Discontinued   Pneumococcal Vaccine: 50+ Years  Discontinued   Colonoscopy  Discontinued        Assessment/Plan:  This is a routine wellness examination for Casey Lynch.  Patient Care Team: Katrinka Garnette KIDD, MD as PCP - General (Family Medicine) Lonni Slain, MD as PCP - Cardiology (Cardiology) Ishmael Slough, MD as Consulting Physician (Rheumatology) Mannam, Praveen, MD as Consulting Physician (Pulmonary Disease) Nicholaus Sherlean CROME, [facility] (Inactive) as Pharmacist (Pharmacist) Joshua Blamer, MD as Consulting Physician (Dermatology)  I have personally reviewed and noted the following in the patients chart:   Medical and social history Use of alcohol, tobacco or illicit drugs  Current medications and supplements including opioid prescriptions. Functional ability and status Nutritional status Physical activity Advanced directives List of other physicians Hospitalizations, surgeries, and ER visits in previous 12 months Vitals Screenings to include cognitive, depression, and falls Referrals and appointments  No orders of the defined types were placed in this encounter.  In  addition, I have reviewed and discussed with patient certain preventive protocols, quality metrics, and best practice recommendations. A written personalized care plan for preventive services as well as general preventive health recommendations were provided to patient.   Ellouise VEAR Haws, LPN   [id number]/79/7973   Return in about 1 year (around 09/29/2025).  After Visit Summary: (MyChart) Due to this being a telephonic visit, the after visit summary with patients personalized plan was offered to patient via MyChart   Nurse Notes: No voiced or noted concerns at this time  "

## 2024-09-24 NOTE — Patient Instructions (Signed)
 Casey Lynch,  Thank you for taking the time for your Medicare Wellness Visit. I appreciate your continued commitment to your health goals. Please review the care plan we discussed, and feel free to reach out if I can assist you further.  Please note that Annual Wellness Visits do not include a physical exam. Some assessments may be limited, especially if the visit was conducted virtually. If needed, we may recommend an in-person follow-up with your provider.  Ongoing Care Seeing your primary care provider every 3 to 6 months helps us  monitor your health and provide consistent, personalized care.   Referrals If a referral was made during today's visit and you haven't received any updates within two weeks, please contact the referred provider directly to check on the status.  Recommended Screenings:  Health Maintenance  Topic Date Due   COVID-19 Vaccine (9 - Mixed Product risk 2025-26 season) 11/14/2024   Kidney health urinalysis for diabetes  12/31/2024   Complete foot exam   02/18/2025   Hemoglobin A1C  02/21/2025   Eye exam for diabetics  05/23/2025   Yearly kidney function blood test for diabetes  08/22/2025   Medicare Annual Wellness Visit  09/24/2025   Flu Shot  Completed   Hepatitis C Screening  Completed   Zoster (Shingles) Vaccine  Completed   Meningitis B Vaccine  Aged Out   Screening for Lung Cancer  Discontinued   DTaP/Tdap/Td vaccine  Discontinued   Pneumococcal Vaccine for age over 20  Discontinued   Colon Cancer Screening  Discontinued       08/28/2023    8:17 AM  Advanced Directives  Does Patient Have a Medical Advance Directive? Yes  Type of Estate Agent of [place];Living will  Does patient want to make changes to medical advance directive? No - Patient declined  Copy of Healthcare Power of Attorney in Chart? Yes - validated most recent copy scanned in chart (See row information)    Vision: Annual vision screenings are recommended for  early detection of glaucoma, cataracts, and diabetic retinopathy. These exams can also reveal signs of chronic conditions such as diabetes and high blood pressure.  Dental: Annual dental screenings help detect early signs of oral cancer, gum disease, and other conditions linked to overall health, including heart disease and diabetes.  Please see the attached documents for additional preventive care recommendations.

## 2024-09-29 ENCOUNTER — Other Ambulatory Visit (HOSPITAL_BASED_OUTPATIENT_CLINIC_OR_DEPARTMENT_OTHER): Payer: Self-pay | Admitting: Cardiology

## 2024-09-30 ENCOUNTER — Ambulatory Visit: Admitting: "Endocrinology

## 2024-10-02 ENCOUNTER — Telehealth: Payer: Self-pay

## 2024-10-02 NOTE — Telephone Encounter (Signed)
 The pt and wife has been advised and instructed  The pt has been advised of the information and verbalized understanding.

## 2024-10-02 NOTE — Telephone Encounter (Signed)
 Due to schedule change appt moved to 11/07/24 at 1245 pm at [facility] with GM

## 2024-10-03 ENCOUNTER — Ambulatory Visit: Admitting: "Endocrinology

## 2024-10-05 ENCOUNTER — Other Ambulatory Visit: Payer: Self-pay | Admitting: Family Medicine

## 2024-10-14 ENCOUNTER — Other Ambulatory Visit (HOSPITAL_BASED_OUTPATIENT_CLINIC_OR_DEPARTMENT_OTHER)

## 2024-10-17 ENCOUNTER — Ambulatory Visit: Admitting: "Endocrinology

## 2024-11-07 ENCOUNTER — Ambulatory Visit (HOSPITAL_COMMUNITY): Admit: 2024-11-07 | Admitting: Gastroenterology

## 2024-11-07 ENCOUNTER — Encounter (HOSPITAL_COMMUNITY): Payer: Self-pay

## 2024-11-07 ENCOUNTER — Ambulatory Visit (HOSPITAL_BASED_OUTPATIENT_CLINIC_OR_DEPARTMENT_OTHER): Admitting: Cardiology

## 2025-02-20 ENCOUNTER — Ambulatory Visit: Admitting: Family Medicine

## 2025-09-29 ENCOUNTER — Ambulatory Visit
